# Patient Record
Sex: Male | Born: 1975 | Race: Asian | Hispanic: No | Smoking: Never smoker
Health system: Southern US, Community
[De-identification: ages and names within clinical notes are randomized; demographics above are authoritative.]

## PROBLEM LIST (undated history)

## (undated) DIAGNOSIS — A159 Respiratory tuberculosis unspecified: Secondary | ICD-10-CM

## (undated) DIAGNOSIS — F32A Depression, unspecified: Secondary | ICD-10-CM

## (undated) DIAGNOSIS — E119 Type 2 diabetes mellitus without complications: Secondary | ICD-10-CM

## (undated) HISTORY — DX: Type 2 diabetes mellitus without complications: E11.9

## (undated) HISTORY — DX: Depression, unspecified: F32.A

## (undated) HISTORY — DX: Respiratory tuberculosis unspecified: A15.9

---

## 2014-01-23 ENCOUNTER — Telehealth: Payer: Self-pay

## 2014-01-23 NOTE — Telephone Encounter (Signed)
Vernona RiegerLaura from Dr. Esperanza HeirWiseman's office [phone]) called for pt to be seen for cough. Pt has been vomitting. He was recently seen in ED over the weekend. Pt had ct done on 01/20/14 at [facility]. He will bring the results with him on a CD. Vernona RiegerLaura was fine with the appt that I gave to her. I made him an appt to see Dr.Porter on 2/24. The best time for an spi is at 9:15am. Pt has npv at 10:30am. Please send paper work to pt. PCP will fax over records.

## 2014-02-20 ENCOUNTER — Ambulatory Visit: Payer: Self-pay | Admitting: Pulmonology

## 2014-02-20 ENCOUNTER — Other Ambulatory Visit: Payer: Self-pay

## 2014-05-03 ENCOUNTER — Other Ambulatory Visit: Payer: Self-pay | Admitting: Infectious Disease

## 2014-05-03 ENCOUNTER — Ambulatory Visit
Admission: RE | Admit: 2014-05-03 | Discharge: 2014-05-03 | Disposition: A | Discharge status: Home / Self Care | Payer: No Typology Code available for payment source | Source: Ambulatory Visit | Attending: Infectious Disease | Admitting: Infectious Disease

## 2014-05-03 DIAGNOSIS — A150 Tuberculosis of lung: Secondary | ICD-10-CM

## 2014-07-26 ENCOUNTER — Other Ambulatory Visit: Payer: Self-pay | Admitting: Infectious Disease

## 2014-07-26 ENCOUNTER — Ambulatory Visit
Admission: RE | Admit: 2014-07-26 | Discharge: 2014-07-26 | Disposition: A | Discharge status: Home / Self Care | Payer: No Typology Code available for payment source | Source: Ambulatory Visit | Attending: Infectious Disease | Admitting: Infectious Disease

## 2014-07-26 DIAGNOSIS — Z09 Encounter for follow-up examination after completed treatment for conditions other than malignant neoplasm: Secondary | ICD-10-CM

## 2015-02-22 ENCOUNTER — Encounter: Payer: Self-pay | Admitting: Endocrinology

## 2015-02-22 ENCOUNTER — Ambulatory Visit (INDEPENDENT_AMBULATORY_CARE_PROVIDER_SITE_OTHER): Payer: 59 | Admitting: Endocrinology

## 2015-02-22 VITALS — BP 120/80 | HR 68 | Temp 99.0°F | Ht 68.0 in | Wt 148.0 lb

## 2015-02-22 DIAGNOSIS — E119 Type 2 diabetes mellitus without complications: Secondary | ICD-10-CM

## 2015-02-22 DIAGNOSIS — E049 Nontoxic goiter, unspecified: Secondary | ICD-10-CM

## 2015-02-22 DIAGNOSIS — E1165 Type 2 diabetes mellitus with hyperglycemia: Secondary | ICD-10-CM

## 2015-02-22 DIAGNOSIS — IMO0002 Reserved for concepts with insufficient information to code with codable children: Secondary | ICD-10-CM

## 2015-02-22 LAB — MICROALBUMIN / CREATININE URINE RATIO
Creatinine,U: 242.2 mg/dL
MICROALB UR: 1.4 mg/dL (ref 0.0–1.9)
MICROALB/CREAT RATIO: 0.6 mg/g (ref 0.0–30.0)

## 2015-02-22 LAB — LIPID PANEL
CHOLESTEROL: 149 mg/dL (ref 0–200)
HDL: 35.7 mg/dL — ABNORMAL LOW (ref >39.00)
LDL Cholesterol: 84 mg/dL (ref 0–99)
NONHDL: 113.3
Total CHOL/HDL Ratio: 4
Triglycerides: 145 mg/dL (ref 0.0–149.0)
VLDL: 29 mg/dL (ref 0.0–40.0)

## 2015-02-22 LAB — COMPREHENSIVE METABOLIC PANEL
ALT: 26 U/L (ref 0–53)
AST: 21 U/L (ref 0–37)
Albumin: 4.6 g/dL (ref 3.5–5.2)
Alkaline Phosphatase: 72 U/L (ref 39–117)
BILIRUBIN TOTAL: 0.5 mg/dL (ref 0.2–1.2)
BUN: 13 mg/dL (ref 6–23)
CO2: 33 meq/L — AB (ref 19–32)
CREATININE: 1 mg/dL (ref 0.40–1.50)
Calcium: 10.1 mg/dL (ref 8.4–10.5)
Chloride: 101 mEq/L (ref 96–112)
GFR: 87.62 mL/min (ref >60.00)
Glucose, Bld: 151 mg/dL — ABNORMAL HIGH (ref 70–99)
Potassium: 4.5 mEq/L (ref 3.5–5.1)
SODIUM: 139 meq/L (ref 135–145)
TOTAL PROTEIN: 7.7 g/dL (ref 6.0–8.3)

## 2015-02-22 LAB — HEMOGLOBIN A1C: Hgb A1c MFr Bld: 7.4 % — ABNORMAL HIGH (ref 4.6–6.5)

## 2015-02-22 LAB — TSH: TSH: 0.78 u[IU]/mL (ref 0.35–4.50)

## 2015-02-22 NOTE — Patient Instructions (Addendum)
Have a protein with every meal and snack  Please check blood sugars at least half the time about 2 hours after any meal and 2 times per week on waking up.  Please bring blood sugar monitor to each visit. Recommended blood sugar levels about 2 hours after meal is 120-160 and on waking up 90-120  Eye Doctors: Dr Hazle Quantigby; Drs Dr Luciana Axeankin, Dr  Dione BoozeGroat

## 2015-02-22 NOTE — Progress Notes (Signed)
Pre visit review using our clinic review tool, if applicable. No additional management support is needed unless otherwise documented below in the visit note. 

## 2015-02-22 NOTE — Progress Notes (Signed)
Patient ID: Joe Boyd, male   DOB: 02/06/1976, 39 y.o.   MRN: [id number]           Reason for Appointment: New visit for Type 2 Diabetes  Referring physician: none  History of Present Illness:          Diagnosis: Type 2 diabetes mellitus, date of diagnosis: 2012        Past history:  At time of diagnosis he was having symptoms of increased thirst and urination and he was seen by his physician when he started feeling weak and dizzy also. His A1c at diagnosis was 12.8. He thinks he was treated with metformin only and also he started changing his diet along with eliminating sweet soft drinks.  Initially was treated with only 500 mg of metformin ER and in 2014 this was increased to 1000 mg.  He has not had any other medications for diabetes. He was last seen [place]ocrin[place] in 07/2014 when his A1c was 6.5 and no changes were made  Recent history:  He has been irregular with checking his blood sugars lately and has not had any recent A1c. Also he has been off his diet with vacationing in [place] and eating more sweets. He is usually trying to exercise home with his treadmill or weights about 3 times a week for 30 minutes Has been keeping his weight about the same Blood sugars at home checked about a month or so ago were usually near normal including after meals, usually under 140.       Oral hypoglycemic drugs the patient is taking are:   metformin ER 500 mg after lunch and dinner     Side effects from medications have been: None Compliance with the medical regimen: Fair   Glucose monitoring:  done one time a day         Glucometer: One Touch.      Blood Glucose readings: None checked recently  Self-care: The diet that the patient has been following is: tries to limit.     Meals: 3 meals per day. Breakfast is either a thin bread or old-fashioned doughnut.  Usually has a sandwich or roti and vegetables for lunch and again roti and vegetables for dinner.  Usually does  not eat much yogurt, sometimes has chicken.  Usually avoiding snacks         Exercise:3-4/7          Dietician visit, most recent: Never                Weight history: 140-148  Wt Readings from Last 3 Encounters:  02/22/15 148 lb (67.132 kg)    Glycemic control:  A1c in 07/2014 was 6.5     Medication List       This list is accurate as of: 02/22/15 11:59 PM.  Always use your most recent med list.               metFORMIN 500 MG 24 hr tablet  Commonly known as:  GLUCOPHAGE-XR  Take 500 mg by mouth 2 (two) times daily.     VIVOTIF DR capsule  Generic drug:  typhoid        Allergies: No Known Allergies  Past Medical History  Diagnosis Date  . Tuberculosis     Finished Rx in 8/15    History reviewed. No pertinent past surgical history.  Family History  Problem Relation Age of Onset  . Diabetes Mother   . Hypertension Mother   .  Thyroid disease Mother   . Cancer Neg Hx     Social History:  reports that he has never smoked. He has never used smokeless tobacco. He reports that he drinks alcohol. He reports that he does not use illicit drugs.    Review of Systems       Vision is normal. Most recent eye exam was over a year ago        Lipids: No recent lipids checked, LDL and triglyceride normal in 2013 and in 2014 LDL was 69, direct LDL 99.  Not on treatment previously       Lab Results  Component Value Date   CHOL 149 02/22/2015   HDL 35.70* 02/22/2015   LDLCALC 84 02/22/2015   TRIG 145.0 02/22/2015   CHOLHDL 4 02/22/2015                  Skin: No rash or infections     Thyroid:  No  unusual fatigue.  Previous TSH has been normal     The blood pressure has been normal without medications     No swelling of feet.     No shortness of breath      No chest tightness  on exertion.     Bowel habits: Normal.      No joint  pains.       Denies excessive frequency of urination or nocturia         No history of Numbness, tingling or burning in  feet       No history of headaches  No history of nasal congestion or allergies   LABS:  Office Visit on 02/22/2015  Component Date Value Ref Range Status  . Hgb A1c MFr Bld 02/22/2015 7.4* 4.6 - 6.5 % Final   Glycemic Control Guidelines for People with Diabetes:Non Diabetic:  <6%Goal of Therapy: <7%Additional Action Suggested:  >8%   . Sodium 02/22/2015 139  135 - 145 mEq/L Final  . Potassium 02/22/2015 4.5  3.5 - 5.1 mEq/L Final  . Chloride 02/22/2015 101  96 - 112 mEq/L Final  . CO2 02/22/2015 33* 19 - 32 mEq/L Final  . Glucose, Bld 02/22/2015 151* 70 - 99 mg/dL Final  . BUN 16/09/9603 13  6 - 23 mg/dL Final  . Creatinine, Ser 02/22/2015 1.00  0.40 - 1.50 mg/dL Final  . Total Bilirubin 02/22/2015 0.5  0.2 - 1.2 mg/dL Final  . Alkaline Phosphatase 02/22/2015 72  39 - 117 U/L Final  . AST 02/22/2015 21  0 - 37 U/L Final  . ALT 02/22/2015 26  0 - 53 U/L Final  . Total Protein 02/22/2015 7.7  6.0 - 8.3 g/dL Final  . Albumin 54/08/8118 4.6  3.5 - 5.2 g/dL Final  . Calcium 14/78/2956 10.1  8.4 - 10.5 mg/dL Final  . GFR 21/30/8657 87.62  >60.00 mL/min Final  . Cholesterol 02/22/2015 149  0 - 200 mg/dL Final   ATP III Classification       Desirable:  < 200 mg/dL               Borderline High:  200 - 239 mg/dL          High:  > = 846 mg/dL  . Triglycerides 02/22/2015 145.0  0.0 - 149.0 mg/dL Final   Normal:  <962 mg/dLBorderline High:  150 - 199 mg/dL  . HDL 02/22/2015 35.70* >39.00 mg/dL Final  . VLDL 95/28/4132 29.0  0.0 - 40.0 mg/dL Final  . LDL  Cholesterol 02/22/2015 84  0 - 99 mg/dL Final  . Total CHOL/HDL Ratio 02/22/2015 4   Final                  Men          Women1/2 Average Risk     3.4          3.3Average Risk          5.0          4.42X Average Risk          9.6          7.13X Average Risk          15.0          11.0                      . NonHDL 02/22/2015 113.30   Final   NOTE:  Non-HDL goal should be 30 mg/dL higher than patient's LDL goal (i.e. LDL goal of < 70 mg/dL,  would have non-HDL goal of < 100 mg/dL)  . TSH 02/22/2015 0.78  0.35 - 4.50 uIU/mL Final  . Microalb, Ur 02/22/2015 1.4  0.0 - 1.9 mg/dL Final  . Creatinine,U 16/09/9603 242.2   Final  . Microalb Creat Ratio 02/22/2015 0.6  0.0 - 30.0 mg/g Final    Physical Examination:  BP 120/80 mmHg  Pulse 68  Temp(Src) 99 F (37.2 C) (Oral)  Ht  (1.727 m)  Wt 148 lb (67.132 kg)  BMI 22.51 kg/m2  GENERAL:      averagely built and nourished HEENT:         Eye exam shows normal external appearance. Fundus exam shows no retinopathy. Oral exam shows normal mucosa .  NECK:         General:  Neck exam shows no lymphadenopathy. Carotids are normal to palpation and no bruit heard.  Thyroid is just palpable on the right side, soft and no nodules felt.   LUNGS:         Chest is symmetrical. Lungs are clear to auscultation.Marland Kitchen   HEART:         Heart sounds:  S1 and S2 are normal. No murmurs or clicks heard., no S3 or S4.   ABDOMEN:   There is no distention present. Liver and spleen are not palpable. No other mass or tenderness present.  EXTREMITIES:     There is no edema. No skin lesions present.Marland Kitchen  NEUROLOGICAL:   Vibration sense is mildly  reduced in toes. Ankle jerks are normal bilaterally.   Diabetic foot exam shows normal monofilament sensation in the toes and plantar surfaces, no skin lesions or ulcers on the feet and normal pedal pulses        MUSCULOSKELETAL:       There is no enlargement or deformity of the joints. Spine is normal to inspection.Marland Kitchen   SKIN:       No rash or lesions of concern.        ASSESSMENT:  Diabetes type 2 , nonobese with usually good control with taking only 1000 mg of metformin ER He has not been checking his blood sugars recently and no recent A1c available.  Previously A1c 6.5 Although his glucose levels were markedly increased at the time of diagnosis he has been able to control his diabetes fairly well with only low-dose metformin as well as controlling portions and  carbohydrates However his diet appears to be relatively low  in protein especially at breakfast and suppertime  Complications: none evident.  Needs microalbumin testing  No history of other medical problems such as hypertension or hyperlipidemia No other risk factors for CAD such as family history  He does have a very small right-sided soft goiter but is clinically euthyroid  PLAN:   Check A1c today  Consider increasing metformin or using Janumet if A1c is significantly high especially if having postprandial hyperglycemia   Start checking blood sugars consistently, at least 3 times a week with more readings 2 hours  after meals  Consultation with dietitian for detailed meal planning  Consistent exercise at least 3-4 times a week   Have protein with every meal including breakfast and also have more protein with evening meal.  Given suggestions for adding lentils, yogurt or soy-based protein with evening meal. Reduce carbohydrates and increase protein at the meals where glucose is higher than 150-160 after meals  Routine labs to be checked including chemistry, microalbumin and lipid panel  Check TSH for small goiter and history of thyroid disease in family   Have given names of ophthalmologists to go for routine eye exams    [facility] 02/23/2015, 2:26 PM   Note: This office note was prepared with Dragon voice recognition system technology. Any transcriptional errors that result from this process are unintentional.   Addendum: A1c is 7.4%.  To increase metformin to 2 tablets at dinner

## 2015-02-23 DIAGNOSIS — E1165 Type 2 diabetes mellitus with hyperglycemia: Secondary | ICD-10-CM | POA: Insufficient documentation

## 2015-02-23 NOTE — Progress Notes (Signed)
Quick Note:  Please let patient know that the A1c is 7.4%. To increase metformin to 2 tablets at dinner   ______

## 2015-02-25 ENCOUNTER — Telehealth: Payer: Self-pay | Admitting: Endocrinology

## 2015-02-25 DIAGNOSIS — E119 Type 2 diabetes mellitus without complications: Secondary | ICD-10-CM

## 2015-02-25 MED ORDER — GLUCOSE BLOOD VI STRP: ORAL_STRIP | Status: DC

## 2015-02-25 NOTE — Telephone Encounter (Signed)
Rx sent to pharmacy   

## 2015-02-25 NOTE — Telephone Encounter (Signed)
Please call in contour test strips 1 time every three days

## 2015-02-26 ENCOUNTER — Telehealth: Payer: Self-pay | Admitting: Endocrinology

## 2015-02-26 NOTE — Telephone Encounter (Signed)
Patient states Dr. Lucianne MussKumar gave him 3 eye doctors to choose from and he chose Dr. Luciana Axeankin  He will need a referral from Dr. Lucianne MussKumar    Please advise    Thank you

## 2015-02-26 NOTE — Telephone Encounter (Signed)
Please see below.

## 2015-02-27 ENCOUNTER — Ambulatory Visit (INDEPENDENT_AMBULATORY_CARE_PROVIDER_SITE_OTHER): Payer: 59 | Admitting: Family

## 2015-02-27 ENCOUNTER — Encounter: Payer: Self-pay | Admitting: Family

## 2015-02-27 VITALS — BP 142/100 | HR 64 | Temp 98.2°F | Resp 18 | Ht 68.0 in | Wt 148.8 lb

## 2015-02-27 DIAGNOSIS — E119 Type 2 diabetes mellitus without complications: Secondary | ICD-10-CM

## 2015-02-27 DIAGNOSIS — R058 Other specified cough: Secondary | ICD-10-CM | POA: Insufficient documentation

## 2015-02-27 DIAGNOSIS — R05 Cough: Secondary | ICD-10-CM

## 2015-02-27 MED ORDER — PREDNISONE 10 MG PO TABS: 10.0000 mg | ORAL_TABLET | Freq: Two times a day (BID) | ORAL | Status: DC

## 2015-02-27 NOTE — Assessment & Plan Note (Signed)
Symptoms consistent with post-viral cough syndrome. Start prednisone x 5 days for relief. Patient informed blood sugars may be slightly increased while on the mild prednisone. Follow up if symptoms worsen or fail to improve.

## 2015-02-27 NOTE — Progress Notes (Signed)
Pre visit review using our clinic review tool, if applicable. No additional management support is needed unless otherwise documented below in the visit note. 

## 2015-02-27 NOTE — Progress Notes (Signed)
Subjective:    Patient ID: Joe Boyd, male    DOB: June 17, 1976, 39 y.o.   MRN: [id number]  Chief Complaint  Patient presents with  . Establish Care    x3 days he has been feeling nauseous, also still having a slight cough from past illness    HPI:  Joe Boyd is a 39 y.o. male who presents today to establish care and discuss an illness.   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 He visited [place] recently and returned with a cough for which he received Azithromycin while he was there.  He contacted the [facility] Department regarding potential TB and no further action was needed.  He continues to experience the associated symptom of cough has been going on for about 2 weeks and the nausea for about 3 days. Has tried cough syrup which seems to help for a while. The cough is described as a dry and sporadic. The intensity is mild and does not keep him up at night. Contextually the cough is worse after talking for long periods of time. Timing of nausea is worse in the morning and improves over the course of the day.   Indicates that he has just recently gone up on his metformin for his diabetes which is being managed by Dr. Lucianne Muss of Endocrinology.   No Known Allergies   Current Outpatient Prescriptions on File Prior to Visit  Medication Sig Dispense Refill  . glucose blood (BAYER CONTOUR TEST) test strip Test once every 3 days. 100 each 5  . metFORMIN (GLUCOPHAGE-XR) 500 MG 24 hr tablet Take 500 mg by mouth 2 (two) times daily.    Marland Kitchen VIVOTIF DR capsule   0   No current facility-administered medications on file prior to visit.    Past Medical History  Diagnosis Date  . Diabetes mellitus without complication   . Tuberculosis     Finished Rx in 8/15    History reviewed. No pertinent past surgical history.  Family History  Problem Relation Age of Onset  .  Diabetes Mother   . Hypertension Mother   . Thyroid disease Mother   . Cancer Neg Hx   . Diabetes Father   . Hypertension Father     History   Social History  . Marital Status: Married    Spouse Name: N/A  . Number of Children: N/A  . Years of Education: N/A   Occupational History  . Not on file.   Social History Main Topics  . Smoking status: Never Smoker   . Smokeless tobacco: Never Used  . Alcohol Use: Yes     Comment: occasionally  . Drug Use: No  . Sexual Activity: Not on file   Other Topics Concern  . Not on file   Social History Narrative    Review of Systems  Constitutional: Negative for fever and chills.  HENT: Negative for congestion and sore throat.   Respiratory: Negative for chest tightness, shortness of breath and wheezing.   Gastrointestinal: Positive for nausea. Negative for vomiting and diarrhea.  Neurological: Negative for headaches.      Objective:    BP 142/100 mmHg  Pulse 64  Temp(Src) 98.2 F (36.8 C) (Oral)  Resp 18  Ht  (1.727 m)  Wt 148 lb 12.8 oz (67.495 kg)  BMI 22.63 kg/m2  SpO2 98% Nursing note and vital signs reviewed.  Physical Exam  Constitutional:  He is oriented to person, place, and time. He appears well-developed and well-nourished. No distress.  HENT:  Right Ear: Hearing, tympanic membrane, external ear and ear canal normal.  Left Ear: Hearing, tympanic membrane, external ear and ear canal normal.  Nose: Nose normal. Right sinus exhibits no maxillary sinus tenderness and no frontal sinus tenderness. Left sinus exhibits no maxillary sinus tenderness and no frontal sinus tenderness.  Mouth/Throat: Uvula is midline, oropharynx is clear and moist and mucous membranes are normal.  Cardiovascular: Normal rate, regular rhythm, normal heart sounds and intact distal pulses.   Pulmonary/Chest: Effort normal and breath sounds normal.  Neurological: He is alert and oriented to person, place, and time.  Skin: Skin is warm and  dry.  Psychiatric: He has a normal mood and affect. His behavior is normal. Judgment and thought content normal.       Assessment & Plan:

## 2015-02-27 NOTE — Patient Instructions (Signed)
Thank you for choosing [facility].  Summary/Instructions:  Your prescription(s) have been submitted to your pharmacy or been printed and provided for you. Please take as directed and contact our office if you believe you are having problem(s) with the medication(s) or have any questions.  If your symptoms worsen or fail to improve, please contact our office for further instruction, or in case of emergency go directly to the emergency room at the closest medical facility.   Most likely the cough is a post-cough from your sickness.   The nausea is most likely related to the increase in metformin. Give it several more days with the new dose of metformin and if it doesn't improve we can adjust your dose of metformin.

## 2015-02-27 NOTE — Assessment & Plan Note (Signed)
Stable and managed by Dr. Lucianne MussKumar of Endocrinology. Continue current dosage of Metformin. Referral made for eye exam.

## 2015-03-01 ENCOUNTER — Other Ambulatory Visit: Payer: Self-pay | Admitting: Endocrinology

## 2015-03-01 DIAGNOSIS — E119 Type 2 diabetes mellitus without complications: Secondary | ICD-10-CM

## 2015-03-01 NOTE — Telephone Encounter (Signed)
Complete

## 2015-03-01 NOTE — Telephone Encounter (Signed)
Noted  

## 2015-03-18 ENCOUNTER — Ambulatory Visit (INDEPENDENT_AMBULATORY_CARE_PROVIDER_SITE_OTHER): Payer: 59 | Admitting: Family

## 2015-03-18 ENCOUNTER — Encounter: Payer: Self-pay | Admitting: Family

## 2015-03-18 VITALS — BP 140/76 | HR 63 | Temp 97.9°F | Resp 18 | Wt 147.8 lb

## 2015-03-18 DIAGNOSIS — Z23 Encounter for immunization: Secondary | ICD-10-CM

## 2015-03-18 DIAGNOSIS — Z0000 Encounter for general adult medical examination without abnormal findings: Secondary | ICD-10-CM

## 2015-03-18 NOTE — Progress Notes (Signed)
Subjective:    Patient ID: Joe Boyd, male    DOB: 1976-09-25, 39 y.o.   MRN: [id number]  Chief Complaint  Patient presents with  . CPE    Fasting    HPI:  Joe Boyd is a 39 y.o. male who presents today for an annual wellness visit.   1) Health Maintenance -   Diet - Average 3 meals per day and a couple snacks; lots of vegetables, occasional fruit; 1-2 cups of caffeine daily  Exercise - 3-4 times per week and is a mainly cardio for about 30-40 minutes at a time.    2) Preventative Exams / Immunizations:  Dental -- Due for exam  Vision -- Up to date   Health Maintenance  Topic Date Due  . TETANUS/TDAP  05/18/1993  . PNEUMOCOCCAL POLYSACCHARIDE VACCINE (1) 03/17/2016 (Originally 05/18/1976)  . INFLUENZA VACCINE  07/29/2015  . HEMOGLOBIN A1C  08/23/2015  . FOOT EXAM  02/23/2016  . URINE MICROALBUMIN  02/23/2016  . OPHTHALMOLOGY EXAM  03/17/2016  . HIV Screening  Addressed    No Known Allergies  Current Outpatient Prescriptions on File Prior to Visit  Medication Sig Dispense Refill  . glucose blood (BAYER CONTOUR TEST) test strip Test once every 3 days. 100 each 5  . metFORMIN (GLUCOPHAGE-XR) 500 MG 24 hr tablet Take 500 mg by mouth 2 (two) times daily.    . Multiple Vitamin (MULTIVITAMIN WITH MINERALS) TABS tablet Take 1 tablet by mouth daily.     No current facility-administered medications on file prior to visit.    Past Medical History  Diagnosis Date  . Diabetes mellitus without complication   . Tuberculosis     Finished Rx in 8/15    No past surgical history on file.  Family History  Problem Relation Age of Onset  . Diabetes Mother   . Hypertension Mother   . Thyroid disease Mother   . Cancer Neg Hx   . Diabetes Father   . Hypertension Father     History   Social History  . Marital Status: Married    Spouse Name: N/A  . Number of Children: 1  . Years of Education: 16   Occupational History  . Education administratorield Trainer     Social  History Main Topics  . Smoking status: Never Smoker   . Smokeless tobacco: Never Used  . Alcohol Use: Yes     Comment: occasionally  . Drug Use: No  . Sexual Activity: Not on file   Other Topics Concern  . Not on file   Social History Narrative   Born and raised in [place] the age of 39. Currently lives with his wife and child. No pets. Fun: work, Engineer, petroleumhang out with friends, bowling.   Denies any religious beliefs that would effect health care.        Review of Systems  Constitutional: Denies fever, chills, fatigue, or significant weight gain/loss. HENT: Head: Denies headache or neck pain Ears: Denies changes in hearing, ringing in ears, earache, drainage Nose: Denies discharge, stuffiness, itching, nosebleed, sinus pain Throat: Denies sore throat, hoarseness, dry mouth, sores, thrush Eyes: Denies loss/changes in vision, pain, redness, blurry/double vision, flashing lights Cardiovascular: Denies chest pain/discomfort, tightness, palpitations, shortness of breath with activity, difficulty lying down, swelling, sudden awakening with shortness of breath Respiratory: Denies shortness of breath, cough, sputum production, wheezing Gastrointestinal: Denies dysphasia, heartburn, change in appetite, nausea, change in bowel habits, rectal bleeding, constipation, diarrhea, yellow skin or eyes Genitourinary: Denies frequency, urgency,  burning/pain, blood in urine, incontinence, change in urinary strength. Musculoskeletal: Denies muscle/joint pain, stiffness, back pain, redness or swelling of joints, trauma Skin: Denies rashes, lumps, itching, dryness, color changes, or hair/nail changes Neurological: Denies dizziness, fainting, seizures, weakness, numbness, tingling, tremor Psychiatric - Denies nervousness, stress, depression or memory loss Endocrine: Denies heat or cold intolerance, sweating, frequent urination, excessive thirst, changes in appetite Hematologic: Denies ease of bruising or  bleeding     Objective:     BP 140/76 mmHg  Pulse 63  Temp(Src) 97.9 F (36.6 C) (Oral)  Resp 18  Wt 147 lb 12.8 oz (67.042 kg)  SpO2 99% Nursing note and vital signs reviewed.  Physical Exam  Constitutional: He is oriented to person, place, and time. He appears well-developed and well-nourished.  HENT:  Head: Normocephalic.  Right Ear: Hearing, tympanic membrane, external ear and ear canal normal.  Left Ear: Hearing, tympanic membrane, external ear and ear canal normal.  Nose: Nose normal.  Mouth/Throat: Uvula is midline, oropharynx is clear and moist and mucous membranes are normal.  Eyes: Conjunctivae and EOM are normal. Pupils are equal, round, and reactive to light.  Neck: Neck supple. No JVD present. No tracheal deviation present. No thyromegaly present.  Cardiovascular: Normal rate, regular rhythm, normal heart sounds and intact distal pulses.   Pulmonary/Chest: Effort normal and breath sounds normal.  Abdominal: Soft. Bowel sounds are normal. He exhibits no distension and no mass. There is no tenderness. There is no rebound and no guarding.  Musculoskeletal: Normal range of motion. He exhibits no edema or tenderness.  Lymphadenopathy:    He has no cervical adenopathy.  Neurological: He is alert and oriented to person, place, and time. He has normal reflexes. No cranial nerve deficit. He exhibits normal muscle tone. Coordination normal.  Skin: Skin is warm and dry.  Psychiatric: He has a normal mood and affect. His behavior is normal. Judgment and thought content normal.       Assessment & Plan:

## 2015-03-18 NOTE — Assessment & Plan Note (Signed)
1) Anticipatory Guidance: Discussed importance of wearing a seatbelt while driving and not texting while driving; changing batteries in smoke detector at least once annually; wearing suntan lotion when outside; eating a balanced and moderate diet; getting physical activity at least 30 minutes per day.  2) Immunizations / Screenings / Labs:  Tetanus shot updated today. Patient declines pneumococcal vaccination. All other immunizations are up-to-date per recommendations. Patient is due for a dental exam. All other screenings are up-to-date per recommendations. Labs previously performed on February 26 with Dr. Lucianne MussKumar. Lab results reviewed and discussed improving HDL through increasing fish, nuts, flaxseed, olive oil, and  other sources of omega-3 fatty acids. May also consider fish oil supplement. All other lab work reviewed was within normal expected ranges.  Overall well exam. Patient's cardiovascular risk factors include diabetes which is currently being managed by Dr. Lucianne MussKumar of endocrinology. Continue current healthy lifestyle choices. Follow-up prevention exam in one year.

## 2015-03-18 NOTE — Progress Notes (Signed)
Pre visit review using our clinic review tool, if applicable. No additional management support is needed unless otherwise documented below in the visit note. 

## 2015-03-18 NOTE — Addendum Note (Signed)
Addended by: Mercer PodWRENN, Solstice Lastinger E on: 03/18/2015 03:12 PM   Modules accepted: Orders

## 2015-03-18 NOTE — Patient Instructions (Signed)
Thank you for choosing [facility].  Summary/Instructions:  Health Maintenance A healthy lifestyle and preventative care can promote health and wellness.  Maintain regular health, dental, and eye exams.  Eat a healthy diet. Foods like vegetables, fruits, whole grains, low-fat dairy products, and lean protein foods contain the nutrients you need and are low in calories. Decrease your intake of foods high in solid fats, added sugars, and salt. Get information about a proper diet from your health care provider, if necessary.  Regular physical exercise is one of the most important things you can do for your health. Most adults should get at least 150 minutes of moderate-intensity exercise (any activity that increases your heart rate and causes you to sweat) each week. In addition, most adults need muscle-strengthening exercises on 2 or more days a week.   Maintain a healthy weight. The body mass index (BMI) is a screening tool to identify possible weight problems. It provides an estimate of body fat based on height and weight. Your health care provider can find your BMI and can help you achieve or maintain a healthy weight. For males 20 years and older:  A BMI below 18.5 is considered underweight.  A BMI of 18.5 to 24.9 is normal.  A BMI of 25 to 29.9 is considered overweight.  A BMI of 30 and above is considered obese.  Maintain normal blood lipids and cholesterol by exercising and minimizing your intake of saturated fat. Eat a balanced diet with plenty of fruits and vegetables. Blood tests for lipids and cholesterol should begin at age 20 and be repeated every 5 years. If your lipid or cholesterol levels are high, you are over age 50, or you are at high risk for heart disease, you may need your cholesterol levels checked more frequently.Ongoing high lipid and cholesterol levels should be treated with medicines if diet and exercise are not working.  If you smoke, find out from your  health care provider how to quit. If you do not use tobacco, do not start.  Lung cancer screening is recommended for adults aged 55-80 years who are at high risk for developing lung cancer because of a history of smoking. A yearly low-dose CT scan of the lungs is recommended for people who have at least a 30-pack-year history of smoking and are current smokers or have quit within the past 15 years. A pack year of smoking is smoking an average of 1 pack of cigarettes a day for 1 year (for example, a 30-pack-year history of smoking could mean smoking 1 pack a day for 30 years or 2 packs a day for 15 years). Yearly screening should continue until the smoker has stopped smoking for at least 15 years. Yearly screening should be stopped for people who develop a health problem that would prevent them from having lung cancer treatment.  If you choose to drink alcohol, do not have more than 2 drinks per day. One drink is considered to be 12 oz (360 mL) of beer, 5 oz (150 mL) of wine, or 1.5 oz (45 mL) of liquor.  Avoid the use of street drugs. Do not share needles with anyone. Ask for help if you need support or instructions about stopping the use of drugs.  High blood pressure causes heart disease and increases the risk of stroke. Blood pressure should be checked at least every 1-2 years. Ongoing high blood pressure should be treated with medicines if weight loss and exercise are not effective.  If you are   45-79 years old, ask your health care provider if you should take aspirin to prevent heart disease.  Diabetes screening involves taking a blood sample to check your fasting blood sugar level. This should be done once every 3 years after age 45 if you are at a normal weight and without risk factors for diabetes. Testing should be considered at a younger age or be carried out more frequently if you are overweight and have at least 1 risk factor for diabetes.  Colorectal cancer can be detected and often  prevented. Most routine colorectal cancer screening begins at the age of 50 and continues through age 75. However, your health care provider may recommend screening at an earlier age if you have risk factors for colon cancer. On a yearly basis, your health care provider may provide home test kits to check for hidden blood in the stool. A small camera at the end of a tube may be used to directly examine the colon (sigmoidoscopy or colonoscopy) to detect the earliest forms of colorectal cancer. Talk to your health care provider about this at age 50 when routine screening begins. A direct exam of the colon should be repeated every 5-10 years through age 75, unless early forms of precancerous polyps or small growths are found.  People who are at an increased risk for hepatitis B should be screened for this virus. You are considered at high risk for hepatitis B if:  You were born in a country where hepatitis B occurs often. Talk with your health care provider about which countries are considered high risk.  Your parents were born in a high-risk country and you have not received a shot to protect against hepatitis B (hepatitis B vaccine).  You have HIV or AIDS.  You use needles to inject street drugs.  You live with, or have sex with, someone who has hepatitis B.  You are a man who has sex with other men (MSM).  You get hemodialysis treatment.  You take certain medicines for conditions like cancer, organ transplantation, and autoimmune conditions.  Hepatitis C blood testing is recommended for all people born from 1945 through 1965 and any individual with known risk factors for hepatitis C.  Healthy men should no longer receive prostate-specific antigen (PSA) blood tests as part of routine cancer screening. Talk to your health care provider about prostate cancer screening.  Testicular cancer screening is not recommended for adolescents or adult males who have no symptoms. Screening includes  self-exam, a health care provider exam, and other screening tests. Consult with your health care provider about any symptoms you have or any concerns you have about testicular cancer.  Practice safe sex. Use condoms and avoid high-risk sexual practices to reduce the spread of sexually transmitted infections (STIs).  You should be screened for STIs, including gonorrhea and chlamydia if:  You are sexually active and are younger than 24 years.  You are older than 24 years, and your health care provider tells you that you are at risk for this type of infection.  Your sexual activity has changed since you were last screened, and you are at an increased risk for chlamydia or gonorrhea. Ask your health care provider if you are at risk.  If you are at risk of being infected with HIV, it is recommended that you take a prescription medicine daily to prevent HIV infection. This is called pre-exposure prophylaxis (PrEP). You are considered at risk if:  You are a man who has sex with other   men (MSM).  You are a heterosexual man who is sexually active with multiple partners.  You take drugs by injection.  You are sexually active with a partner who has HIV.  Talk with your health care provider about whether you are at high risk of being infected with HIV. If you choose to begin PrEP, you should first be tested for HIV. You should then be tested every 3 months for as long as you are taking PrEP.  Use sunscreen. Apply sunscreen liberally and repeatedly throughout the day. You should seek shade when your shadow is shorter than you. Protect yourself by wearing long sleeves, pants, a wide-brimmed hat, and sunglasses year round whenever you are outdoors.  Tell your health care provider of new moles or changes in moles, especially if there is a change in shape or color. Also, tell your health care provider if a mole is larger than the size of a pencil eraser.  A one-time screening for abdominal aortic aneurysm  (AAA) and surgical repair of large AAAs by ultrasound is recommended for men aged 65-75 years who are current or former smokers.  Stay current with your vaccines (immunizations). Document Released: 06/11/2008 Document Revised: 12/19/2013 Document Reviewed: 05/11/2011 Exit[employer] Patient Information 2015 [employer]. This information is not intended to replace advice given to you by your health care provider. Make sure you discuss any questions you have with your health care provider.    

## 2015-03-26 ENCOUNTER — Encounter: Payer: 59 | Admitting: Family

## 2015-04-12 ENCOUNTER — Other Ambulatory Visit: Payer: Self-pay | Admitting: *Deleted

## 2015-04-12 ENCOUNTER — Telehealth: Payer: Self-pay | Admitting: Endocrinology

## 2015-04-12 MED ORDER — METFORMIN HCL ER 500 MG PO TB24: 500.0000 mg | ORAL_TABLET | Freq: Two times a day (BID) | ORAL | Status: DC

## 2015-04-12 NOTE — Telephone Encounter (Signed)
Patient need refill of metformin 90 day supply

## 2015-04-12 NOTE — Telephone Encounter (Signed)
rx sent

## 2015-06-03 ENCOUNTER — Encounter: Payer: Self-pay | Admitting: Family

## 2015-06-03 ENCOUNTER — Ambulatory Visit (INDEPENDENT_AMBULATORY_CARE_PROVIDER_SITE_OTHER): Payer: 59 | Admitting: Family

## 2015-06-03 VITALS — BP 152/96 | HR 77 | Temp 99.0°F | Resp 18 | Ht 68.0 in | Wt 151.1 lb

## 2015-06-03 DIAGNOSIS — R059 Cough, unspecified: Secondary | ICD-10-CM

## 2015-06-03 DIAGNOSIS — R05 Cough: Secondary | ICD-10-CM | POA: Diagnosis not present

## 2015-06-03 MED ORDER — CEFUROXIME AXETIL 250 MG PO TABS: 250.0000 mg | ORAL_TABLET | Freq: Two times a day (BID) | ORAL | Status: DC

## 2015-06-03 MED ORDER — HYDROCODONE-HOMATROPINE 5-1.5 MG/5ML PO SYRP: 5.0000 mL | ORAL_SOLUTION | Freq: Three times a day (TID) | ORAL | Status: DC | PRN

## 2015-06-03 NOTE — Progress Notes (Signed)
Pre visit review using our clinic review tool, if applicable. No additional management support is needed unless otherwise documented below in the visit note. 

## 2015-06-03 NOTE — Assessment & Plan Note (Signed)
Symptoms and exam consistent with possible sinusitis and upper respiratory infection. Given continued worsening and fever start Ceftin. Start Hycodan as needed for cough and sleep. Continue over-the-counter medications as needed for symptom relief and supportive care. Follow up if symptoms worsen or fail to improve.

## 2015-06-03 NOTE — Progress Notes (Signed)
   Subjective:    Patient ID: Joe Boyd, male    DOB: 08/10/1976, 39 y.o.   MRN: [id number]  Chief Complaint  Patient presents with  . Cough    x3 days, head feels like its going to explode, cough, sore throat, runny, nausea, has vomited, and fatigue, did have a fever of 100.1 yesterday and has had chills    HPI:  Joe Boyd is a 39 y.o. male with a PMH of type 2 diabetes who presents today for an acute office visit.  Associated symptoms of cough, sore throat, fever, body aches, and fatigue has been going on for about 3 days.  Modifying factors include Tylenol and Robutssin DM which has helped a little. Denies any recent antibiotics. Indicates that he has progressively worsened.   No Known Allergies   Current Outpatient Prescriptions on File Prior to Visit  Medication Sig Dispense Refill  . glucose blood (BAYER CONTOUR TEST) test strip Test once every 3 days. 100 each 5  . metFORMIN (GLUCOPHAGE-XR) 500 MG 24 hr tablet Take 1 tablet (500 mg total) by mouth 2 (two) times daily. 180 tablet 1  . Multiple Vitamin (MULTIVITAMIN WITH MINERALS) TABS tablet Take 1 tablet by mouth daily.     No current facility-administered medications on file prior to visit.    Review of Systems  Constitutional: Positive for fever and chills.  HENT: Positive for congestion, sinus pressure and sore throat. Negative for ear pain.   Respiratory: Positive for cough. Negative for chest tightness and shortness of breath.   Neurological: Positive for headaches.      Objective:    BP 152/96 mmHg  Pulse 77  Temp(Src) 99 F (37.2 C) (Oral)  Resp 18  Ht 5\' 8"  (1.727 m)  Wt 151 lb 1.9 oz (68.548 kg)  BMI 22.98 kg/m2  SpO2 98% Nursing note and vital signs reviewed.  Physical Exam  Constitutional: He is oriented to person, place, and time. He appears well-developed and well-nourished. No distress.  HENT:  Right Ear: Hearing, tympanic membrane, external ear and ear canal normal.  Left Ear:  Hearing, tympanic membrane, external ear and ear canal normal.  Nose: Right sinus exhibits maxillary sinus tenderness. Left sinus exhibits maxillary sinus tenderness.  Mouth/Throat: Uvula is midline, oropharynx is clear and moist and mucous membranes are normal.  Neck: Neck supple.  Cardiovascular: Normal rate, regular rhythm, normal heart sounds and intact distal pulses.   Pulmonary/Chest: Effort normal and breath sounds normal.  Lymphadenopathy:    He has no cervical adenopathy.  Neurological: He is alert and oriented to person, place, and time.  Skin: Skin is warm and dry.  Psychiatric: He has a normal mood and affect. His behavior is normal. Judgment and thought content normal.       Assessment & Plan:   Problem List Items Addressed This Visit      Other   Cough - Primary    Symptoms and exam consistent with possible sinusitis and upper respiratory infection. Given continued worsening and fever start Ceftin. Start Hycodan as needed for cough and sleep. Continue over-the-counter medications as needed for symptom relief and supportive care. Follow up if symptoms worsen or fail to improve.      Relevant Medications   cefUROXime (CEFTIN) 250 MG tablet   HYDROcodone-homatropine (HYCODAN) 5-1.5 MG/5ML syrup

## 2015-06-03 NOTE — Patient Instructions (Signed)
Thank you for choosing [facility].  Summary/Instructions:  Your prescription(s) have been submitted to your pharmacy or been printed and provided for you. Please take as directed and contact our office if you believe you are having problem(s) with the medication(s) or have any questions.  If your symptoms worsen or fail to improve, please contact our office for further instruction, or in case of emergency go directly to the emergency room at the closest medical facility.   General Recommendations:    Please drink plenty of fluids.  Get plenty of rest   Sleep in humidified air  Use saline nasal sprays  Netti pot   OTC Medications:  Decongestants - helps relieve congestion   Flonase (generic fluticasone) or Nasacort (generic triamcinolone) - please make sure to use the "cross-over" technique at a 45 degree angle towards the opposite eye as opposed to straight up the nasal passageway.   If you have HIGH BLOOD PRESSURE - Coricidin HBP; AVOID any product that is -D as this contains pseudoephedrine which may increase your blood pressure.  Afrin (oxymetazoline) every 6-8 hours for up to 3 days.   Allergies - helps relieve runny nose, itchy eyes and sneezing   Claritin (generic loratidine), Allegra (fexofenidine), or Zyrtec (generic cyrterizine) for runny nose. These medications should not cause drowsiness.  Note - Benadryl (generic diphenhydramine) may be used however may cause drowsiness  Cough -   Delsym or Robitussin (generic dextromethorphan)  Expectorants - helps loosen mucus to ease removal   Mucinex (generic guaifenesin) as directed on the package.  Headaches / General Aches   Tylenol (generic acetaminophen) - DO NOT EXCEED 3 grams (3,000 mg) in a 24 hour time period  Advil/Motrin (generic ibuprofen)   Sore Throat -   Salt water gargle   Chloraseptic (generic benzocaine) spray or lozenges / Sucrets (generic dyclonine)      

## 2015-06-19 ENCOUNTER — Ambulatory Visit (INDEPENDENT_AMBULATORY_CARE_PROVIDER_SITE_OTHER): Payer: 59 | Admitting: Internal Medicine

## 2015-06-19 ENCOUNTER — Other Ambulatory Visit: Payer: 59

## 2015-06-19 ENCOUNTER — Encounter: Payer: Self-pay | Admitting: Internal Medicine

## 2015-06-19 VITALS — BP 120/72 | HR 78 | Temp 98.4°F | Resp 16 | Ht 68.0 in | Wt 151.0 lb

## 2015-06-19 DIAGNOSIS — H109 Unspecified conjunctivitis: Secondary | ICD-10-CM | POA: Diagnosis not present

## 2015-06-19 MED ORDER — TOBRAMYCIN-DEXAMETHASONE 0.3-0.1 % OP SUSP: 2.0000 [drp] | OPHTHALMIC | Status: DC

## 2015-06-19 NOTE — Progress Notes (Signed)
Subjective:  Patient ID: Joe Boyd, male    DOB: 1976-02-12  Age: 39 y.o. MRN: [id number]  CC: Conjunctivitis   HPI Joe Boyd presents for a 3 day hx of red, itchy, crusty, irritated eyes and a NP cough that started 5 days ago and is resolving.  Outpatient Prescriptions Prior to Visit  Medication Sig Dispense Refill  . glucose blood (BAYER CONTOUR TEST) test strip Test once every 3 days. 100 each 5  . metFORMIN (GLUCOPHAGE-XR) 500 MG 24 hr tablet Take 1 tablet (500 mg total) by mouth 2 (two) times daily. 180 tablet 1  . Multiple Vitamin (MULTIVITAMIN WITH MINERALS) TABS tablet Take 1 tablet by mouth daily.    . cefUROXime (CEFTIN) 250 MG tablet Take 1 tablet (250 mg total) by mouth 2 (two) times daily with a meal. (Patient not taking: Reported on 06/19/2015) 20 tablet 0  . HYDROcodone-homatropine (HYCODAN) 5-1.5 MG/5ML syrup Take 5 mLs by mouth every 8 (eight) hours as needed for cough. (Patient not taking: Reported on 06/19/2015) 120 mL 0   No facility-administered medications prior to visit.    ROS Review of Systems  Constitutional: Negative.  Negative for fever, chills, diaphoresis, appetite change and fatigue.  HENT: Negative.  Negative for congestion, mouth sores, nosebleeds, postnasal drip, rhinorrhea, sinus pressure, sore throat, tinnitus, trouble swallowing and voice change.   Eyes: Positive for discharge, redness and itching. Negative for photophobia, pain and visual disturbance.  Respiratory: Negative.  Negative for cough, choking, chest tightness, shortness of breath and stridor.   Cardiovascular: Negative.  Negative for chest pain, palpitations and leg swelling.  Gastrointestinal: Negative.  Negative for nausea, vomiting, abdominal pain, diarrhea, constipation and blood in stool.  Endocrine: Negative.   Genitourinary: Negative.   Musculoskeletal: Negative.  Negative for myalgias, back pain, joint swelling and arthralgias.  Skin: Negative.  Negative for rash.    Allergic/Immunologic: Negative.   Neurological: Negative.  Negative for dizziness, light-headedness and numbness.  Hematological: Negative.  Negative for adenopathy. Does not bruise/bleed easily.  Psychiatric/Behavioral: Negative.     Objective:  BP 120/72 mmHg  Pulse 78  Temp(Src) 98.4 F (36.9 C) (Oral)  Ht  (1.727 m)  Wt 151 lb (68.493 kg)  BMI 22.96 kg/m2  SpO2 97%  BP Readings from Last 3 Encounters:  06/19/15 120/72  06/03/15 152/96  03/18/15 140/76    Wt Readings from Last 3 Encounters:  06/19/15 151 lb (68.493 kg)  06/03/15 151 lb 1.9 oz (68.548 kg)  03/18/15 147 lb 12.8 oz (67.042 kg)    Physical Exam  Constitutional: He is oriented to person, place, and time. He appears well-developed and well-nourished.  Non-toxic appearance. He does not have a sickly appearance. He does not appear ill. No distress.  HENT:  Mouth/Throat: Oropharynx is clear and moist. No oropharyngeal exudate.  Eyes: EOM are normal. Pupils are equal, round, and reactive to light. Right eye exhibits no chemosis, no discharge, no exudate and no hordeolum. No foreign body present in the right eye. Left eye exhibits no chemosis, no discharge, no exudate and no hordeolum. No foreign body present in the left eye. Right conjunctiva is injected. Right conjunctiva has no hemorrhage. Left conjunctiva is injected. Left conjunctiva has no hemorrhage. No scleral icterus. Right eye exhibits normal extraocular motion and no nystagmus. Left eye exhibits normal extraocular motion and no nystagmus.    Neck: Normal range of motion. Neck supple. No JVD present. No tracheal deviation present. No thyromegaly present.  Cardiovascular: Normal rate, regular  rhythm, normal heart sounds and intact distal pulses.  Exam reveals no gallop and no friction rub.   No murmur heard. Pulmonary/Chest: Effort normal and breath sounds normal. No stridor. No respiratory distress. He has no wheezes. He has no rales. He exhibits no  tenderness.  Abdominal: Soft. Bowel sounds are normal. He exhibits no distension and no mass. There is no tenderness. There is no rebound and no guarding.  Musculoskeletal: Normal range of motion. He exhibits no edema or tenderness.  Lymphadenopathy:    He has no cervical adenopathy.  Neurological: He is oriented to person, place, and time.  Skin: Skin is warm and dry. No rash noted. He is not diaphoretic. No erythema. No pallor.  Vitals reviewed.   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  EXAM: CHEST - 1 VIEW  COMPARISON:  May 03, 2014.  FINDINGS: The heart size and mediastinal contours are within normal limits. Left lung is clear. Nodular density seen in the right middle lobe are significantly improved, although residual densities remain. The visualized skeletal structures are unremarkable.  IMPRESSION: Significantly improved nodular density seen in right middle lobe noted on prior exam, consistent with improving inflammation.   Electronically Signed   By: Roque Lias M.D.   On: 07/26/2014 16:01    Assessment & Plan:   Denys was seen today for conjunctivitis.  Diagnoses and all orders for this visit:  Conjunctivitis of left eye - he has viral conjunctivitis, will start tobradex ophth Orders: -     tobramycin-dexamethasone (TOBRADEX) ophthalmic solution; Place 2 drops into the left eye every 4 (four) hours while awake.   I have discontinued Mr. Ouellette cefUROXime and HYDROcodone-homatropine. I am also having him start on tobramycin-dexamethasone.  Additionally, I am having him maintain his glucose blood, multivitamin with minerals, and metFORMIN.  Meds ordered this encounter  Medications  . tobramycin-dexamethasone (TOBRADEX) ophthalmic solution    Sig: Place 2 drops into the left eye every 4 (four) hours while awake.    Dispense:  5 mL    Refill:  0     Follow-up: Return in about 3 weeks (around 07/10/2015).  Sanda Linger, MD

## 2015-06-19 NOTE — Patient Instructions (Signed)

## 2015-06-22 ENCOUNTER — Encounter: Payer: Self-pay | Admitting: Internal Medicine

## 2015-06-24 ENCOUNTER — Ambulatory Visit: Payer: 59 | Admitting: Endocrinology

## 2015-06-24 ENCOUNTER — Encounter: Payer: 59 | Admitting: Dietician

## 2015-07-08 ENCOUNTER — Other Ambulatory Visit (INDEPENDENT_AMBULATORY_CARE_PROVIDER_SITE_OTHER): Payer: 59

## 2015-07-08 ENCOUNTER — Other Ambulatory Visit: Payer: Self-pay | Admitting: *Deleted

## 2015-07-08 DIAGNOSIS — E119 Type 2 diabetes mellitus without complications: Secondary | ICD-10-CM

## 2015-07-08 LAB — BASIC METABOLIC PANEL
BUN: 11 mg/dL (ref 6–23)
CALCIUM: 9.3 mg/dL (ref 8.4–10.5)
CO2: 29 mEq/L (ref 19–32)
Chloride: 103 mEq/L (ref 96–112)
Creatinine, Ser: 0.89 mg/dL (ref 0.40–1.50)
GFR: 100.05 mL/min (ref >60.00)
GLUCOSE: 150 mg/dL — AB (ref 70–99)
Potassium: 4.2 mEq/L (ref 3.5–5.1)
Sodium: 140 mEq/L (ref 135–145)

## 2015-07-08 LAB — HEMOGLOBIN A1C: Hgb A1c MFr Bld: 7.3 % — ABNORMAL HIGH (ref 4.6–6.5)

## 2015-07-12 ENCOUNTER — Encounter: Payer: Self-pay | Admitting: Endocrinology

## 2015-07-12 ENCOUNTER — Ambulatory Visit (INDEPENDENT_AMBULATORY_CARE_PROVIDER_SITE_OTHER): Payer: 59 | Admitting: Endocrinology

## 2015-07-12 ENCOUNTER — Encounter: Payer: 59 | Attending: Endocrinology | Admitting: Dietician

## 2015-07-12 ENCOUNTER — Other Ambulatory Visit: Payer: Self-pay | Admitting: *Deleted

## 2015-07-12 ENCOUNTER — Encounter: Payer: Self-pay | Admitting: Dietician

## 2015-07-12 VITALS — Ht 67.0 in | Wt 149.0 lb

## 2015-07-12 VITALS — BP 126/72 | HR 72 | Temp 97.6°F | Resp 14 | Ht 68.0 in | Wt 149.8 lb

## 2015-07-12 DIAGNOSIS — R748 Abnormal levels of other serum enzymes: Secondary | ICD-10-CM | POA: Diagnosis not present

## 2015-07-12 DIAGNOSIS — Z6823 Body mass index (BMI) 23.0-23.9, adult: Secondary | ICD-10-CM | POA: Insufficient documentation

## 2015-07-12 DIAGNOSIS — E119 Type 2 diabetes mellitus without complications: Secondary | ICD-10-CM | POA: Diagnosis present

## 2015-07-12 DIAGNOSIS — Z713 Dietary counseling and surveillance: Secondary | ICD-10-CM | POA: Diagnosis not present

## 2015-07-12 MED ORDER — SAXAGLIPTIN-METFORMIN ER 2.5-1000 MG PO TB24: ORAL_TABLET | ORAL | Status: DC

## 2015-07-12 MED ORDER — SITAGLIP PHOS-METFORMIN HCL ER 50-1000 MG PO TB24: ORAL_TABLET | ORAL | Status: DC

## 2015-07-12 NOTE — Progress Notes (Signed)
Patient ID: Joe Boyd, male   DOB: 1976-08-26, 39 y.o.   MRN: [id number]           Reason for Appointment: Follow-up visit for Type 2 Diabetes  Referring physician: none  History of Present Illness:          Diagnosis: Type 2 diabetes mellitus, date of diagnosis: 2012        Past history:  At time of diagnosis he was having symptoms of increased thirst and urination and he was seen by his physician when he started feeling weak and dizzy also. His A1c at diagnosis was 12.8. He thinks he was treated with metformin only and also he started changing his diet along with eliminating sweet soft drinks.  Initially was treated with only 500 mg of metformin ER and in 2014 this was increased to 1000 mg.  He has not had any other medications for diabetes. He was [place] when his A1c was 6.5 and no changes were made  Recent history:  He appeared to have relatively higher blood sugars when he was first seen for this consultation in 2/16 and taking 1000 mg of metformin a day alone Also was relatively noncompliant with diet, glucose monitoring and regular follow-up  His metformin was increased to 1500 mg a day which he is tolerating Despite instructions he is checking his blood sugar occasionally in the mornings only and did not bring any record Fasting lab glucose was 150 Also the last 3 months because of various factors and stress he has not exercise and is starting to walk only recently A1c is still about the same He thinks his weight is relatively higher than usual       Oral hypoglycemic drugs the patient is taking are:   metformin ER 500 mg after lunch and dinner     Side effects from medications have been: None Compliance with the medical regimen: Fair   Glucose monitoring:  done one time a day         Glucometer: One Touch.      Blood Glucose readings: 130 fasting usually  None checked recently  Self-care:  Meals: 3 meals per day.  Breakfast is usually a small sandwich with chicken Usually has a sandwich or roti and vegetables for lunch and again roti and vegetables for dinner.   Usually does not eat much yogurt, sometimes has chicken.   Usually avoiding snacks         Exercise: Recently started back; 3-4/7 days,  walking 35 minutes         Dietician visit, most recent: 7/16                Weight history: 140-148  Wt Readings from Last 3 Encounters:  07/12/15 149 lb (67.586 kg)  07/12/15 149 lb 12.8 oz (67.949 kg)  06/19/15 151 lb (68.493 kg)    Glycemic control:  A1c in 07/2014 was 6.5  Lab Results  Component Value Date   HGBA1C 7.3* 07/08/2015   HGBA1C 7.4* 02/22/2015   Lab Results  Component Value Date   MICROALBUR 1.4 02/22/2015   LDLCALC 84 02/22/2015   CREATININE 0.89 07/08/2015        Medication List       This list is accurate as of: 07/12/15  3:48 PM.  Always use your most recent med list.               glucose blood test strip  Commonly known as:  BAYER CONTOUR TEST  Test once every 3 days.     multivitamin with minerals Tabs tablet  Take 1 tablet by mouth daily.     SitaGLIPtin-MetFORMIN HCl 50-1000 MG Tb24  Commonly known as:  JANUMET XR  2 tabs with dinner        Allergies: No Known Allergies  Past Medical History  Diagnosis Date  . Diabetes mellitus without complication   . Tuberculosis     Finished Rx in 8/15    No past surgical history on file.  Family History  Problem Relation Age of Onset  . Diabetes Mother   . Hypertension Mother   . Thyroid disease Mother   . Cancer Neg Hx   . Diabetes Father   . Hypertension Father     Social History:  reports that he has never smoked. He has never used smokeless tobacco. He reports that he drinks alcohol. He reports that he does not use illicit drugs.    Review of Systems      Most recent eye exam was over a year ago        Lipids:  LDL and triglyceride normal in 2013 and in 2014 LDL was 69, direct LDL 99.   Not on treatment        Lab Results  Component Value Date   CHOL 149 02/22/2015   HDL 35.70* 02/22/2015   LDLCALC 84 02/22/2015   TRIG 145.0 02/22/2015   CHOLHDL 4 02/22/2015                  Thyroid: Has a small goiter  Lab Results  Component Value Date   TSH 0.78 02/22/2015      LABS:  Lab on 07/08/2015  Component Date Value Ref Range Status  . Hgb A1c MFr Bld 07/08/2015 7.3* 4.6 - 6.5 % Final   Glycemic Control Guidelines for People with Diabetes:Non Diabetic:  <6%Goal of Therapy: <7%Additional Action Suggested:  >8%   . Sodium 07/08/2015 140  135 - 145 mEq/L Final  . Potassium 07/08/2015 4.2  3.5 - 5.1 mEq/L Final  . Chloride 07/08/2015 103  96 - 112 mEq/L Final  . CO2 07/08/2015 29  19 - 32 mEq/L Final  . Glucose, Bld 07/08/2015 150* 70 - 99 mg/dL Final  . BUN 16/09/9603 11  6 - 23 mg/dL Final  . Creatinine, Ser 07/08/2015 0.89  0.40 - 1.50 mg/dL Final  . Calcium 54/08/8118 9.3  8.4 - 10.5 mg/dL Final  . GFR 14/78/2956 100.05  >60.00 mL/min Final    Physical Examination:  BP 126/72 mmHg  Pulse 72  Temp(Src) 97.6 F (36.4 C)  Resp 14  Ht  (1.727 m)  Wt 149 lb 12.8 oz (67.949 kg)  BMI 22.78 kg/m2  SpO2 99%      ASSESSMENT:  Diabetes type 2 , nonobese with inadequate control on metformin alone He is checking only fasting readings as discussed above and these are mildly increased Discussed that since his A1c is 7.3 is likely to have postprandial hyperglycemia also He is going to see the dietitian today and also discussed having consistent diet with balanced nutrients Recently has started walking again Since he has had some progression of his diabetes he would benefit from adding another drug like Januvia and discussed how this works  PLAN:   Check blood sugar at least every other day after meals, discussed blood sugar targets  Switch metformin to Janumet XR 50/1000, 2 tablets daily  Review home blood sugars in about 2 months  Consultation  with the dietitian today  Consistent exercise   [facility] 07/12/2015, 3:48 PM   Note: This office note was prepared with Dragon voice recognition system technology. Any transcriptional errors that result from this process are unintentional.

## 2015-07-12 NOTE — Patient Instructions (Signed)
Check blood sugars on waking up .. 2-3 .. times a week  Also check blood sugars about 2 hours after a meal and do this after different meals by rotation  Recommended blood sugar levels on waking up is 90-130 and about 2 hours after meal is 140-180 Please bring blood sugar monitor to each visit.  

## 2015-07-12 NOTE — Patient Instructions (Signed)
Continue working to maintain the exercise habit.  30-45 minutes most days of the week. Plan:  Aim for 4-5 Carb Choices per meal (60-75 grams) +/- 1 either way  Aim for 0-2 Carbs per snack if hungry (A handful of almonds is a good choice.) Include protein in moderation with your meals and snacks Lentils and other dried beans are a good protein choices.  Having increased vegetable curries as apposed to meat Consider reading food labels for Total Carbohydrate and Fat Grams of foods Consider checking BG at alternate times per day as directed by MD

## 2015-07-12 NOTE — Progress Notes (Signed)
Diabetes Self-Management Education  Visit Type: First/Initial  Appt. Start Time: 1415 Appt. End Time: 1530  07/12/2015  Mr. Joe Boyd, identified by name and date of birth, is a 39 y.o. male with a diagnosis of Diabetes: Type 2 (4 years).  Other people present during visit:  Patient   Patient lives with his ex wife and son.  Mother and stepfather visit patient every couple of weeks.  (Father is a retired Development worker, international aid).  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  He has [employer] in town and recently moved from [place].  He visited [place] in [place] for 1 month and ate increased sweets at that time.  He has not been exercising due to stress but started back this week and notes improvement in his energy level and sleep.  He tries to walk-jog for 35-45 minutes 5 times per week.  He is mindful of his weight control and noted that UBW is 143 lbs which recently increased to 153 lbs.  He reduced this to 149 lbs.   ASSESSMENT  Height  (1.702 m), weight 149 lb (67.586 kg). Body mass index is 23.33 kg/(m^2).  Initial Visit Information:  Are you currently following a meal plan?: No   Are you taking your medications as prescribed?: Yes Are you checking your feet?: No   How often do you need to have someone help you when you read instructions, pamphlets, or other written materials from your doctor or pharmacy?: 1 - Never What is the last grade level you completed in school?: 1 class short of master's degree  Psychosocial:     Patient Belief/Attitude about Diabetes: Motivated to manage diabetes Self-care barriers: None Self-management support: Doctor's office, Family Other persons present: Patient Patient Concerns: Nutrition/Meal planning, Healthy Lifestyle Special Needs: None Preferred Learning Style: No preference indicated Learning Readiness: Ready  Complications:   Last HgB A1C per patient/outside source: 7.3 mg/dL (1/61/09) How often do you check  your blood sugar?: 0 times/day (not testing) (only once per week) Fasting Blood glucose range (mg/dL): 604-540 Have you had a dilated eye exam in the past 12 months?: Yes Have you had a dental exam in the past 12 months?: No  Diet Intake:  Breakfast: chicken sandwich on flatbread or croissant OR 2 oldfashioned donut with coffee (10:30 or 11:00) Lunch: 3 [employer] chipate, cooked vegetables, occasional rice (4 pm) Dinner: 1-2 [employer] chipate, cooked vegetables, ocasional rice (8-9) Snack (evening): ice cream a couple of times per week Beverage(s): water or coffee with cream and splenda with occasional caremel swirl  Exercise:  Exercise: Light (walking / raking leaves), Strenuous (running) Light Exercise amount of time (min / week): 90 Strenous Exercise amount of time (min / week): 30  Individualized Plan for Diabetes Self-Management Training:   Learning Objective:  Patient will have a greater understanding of diabetes self-management.  Patient education plan per assessed needs and concerns is to attend individual sessions for     Education Topics Reviewed with Patient Today:  Definition of diabetes, type 1 and 2, and the diagnosis of diabetes, Factors that contribute to the development of diabetes Role of diet in the treatment of diabetes and the relationship between the three main macronutrients and blood glucose level, Food label reading, portion sizes and measuring food., Carbohydrate counting, Meal timing in regards to the patients' current diabetes medication., Reviewed blood glucose goals for pre and post meals and how to evaluate the patients' food intake on their blood glucose level., Information on hints  to eating out and maintain blood glucose control., Meal options for control of blood glucose level and chronic complications. Role of exercise on diabetes management, blood pressure control and cardiac health.   Taught/discussed recording of test results and interpretation of SMBG.,  Interpreting lab values - A1C, lipid, urine microalbumina.   Relationship between chronic complications and blood glucose control, Lipid levels, blood glucose control and heart disease, Assessed and discussed foot care and prevention of foot problems, Retinopathy and reason for yearly dilated eye exams, Dental care Role of stress on diabetes   Lifestyle issues that need to be addressed for better diabetes care  PATIENTS GOALS/Plan (Developed by the patient):  Nutrition: Follow meal plan discussed, General guidelines for healthy choices and portions discussed Physical Activity: Exercise 5-7 days per week, 30 minutes per day Medications: take my medication as prescribed Monitoring : test blood glucose pre and post meals as discussed Reducing Risk: examine blood glucose patterns, do foot checks daily  Plan:   Patient Instructions  Continue working to maintain the exercise habit.  30-45 minutes most days of the week. Plan:  Aim for 4-5 Carb Choices per meal (60-75 grams) +/- 1 either way  Aim for 0-2 Carbs per snack if hungry (A handful of almonds is a good choice.) Include protein in moderation with your meals and snacks Lentils and other dried beans are a good protein choices.  Having increased vegetable curries as apposed to meat Consider reading food labels for Total Carbohydrate and Fat Grams of foods Consider checking BG at alternate times per day as directed by MD    Discussed checking his blood sugar before and after meal to evaluate how that meal affected his blood sugar.  Preferred choices discussed.    Expected Outcomes:  Demonstrated interest in learning. Expect positive outcomes  Education material provided: Living Well with Diabetes, Food label handouts, A1C conversion sheet, Meal plan card and Support group flyer  Breakfast  If problems or questions, patient to contact team via:  Email  Future DSME appointment: PRN

## 2015-07-18 ENCOUNTER — Encounter: Payer: Self-pay | Admitting: Endocrinology

## 2015-09-12 ENCOUNTER — Ambulatory Visit (INDEPENDENT_AMBULATORY_CARE_PROVIDER_SITE_OTHER): Payer: 59 | Admitting: Endocrinology

## 2015-09-12 ENCOUNTER — Encounter: Payer: Self-pay | Admitting: Endocrinology

## 2015-09-12 VITALS — BP 128/82 | HR 74 | Temp 98.3°F | Resp 14 | Ht 68.0 in | Wt 151.8 lb

## 2015-09-12 DIAGNOSIS — E119 Type 2 diabetes mellitus without complications: Secondary | ICD-10-CM | POA: Diagnosis not present

## 2015-09-12 DIAGNOSIS — Z23 Encounter for immunization: Secondary | ICD-10-CM

## 2015-09-12 NOTE — Patient Instructions (Signed)
Less sweets, more protein at each meal  Check blood sugars on waking up .Marland Kitchen 2-3 .Marland Kitchen times a week Also check blood sugars about 2 hours after a meal and do this after different meals by rotation Recommended blood sugar levels on waking up is 90-130 and about 2 hours after meal is 140-180 Please bring blood sugar monitor to each visit.

## 2015-09-12 NOTE — Progress Notes (Signed)
Patient ID: Joe Boyd, male   DOB: [birth year], 39 y.o.   MRN: [id number]           Reason for Appointment: Follow-up visit for Type 2 Diabetes  Referring physician: none  History of Present Illness:          Diagnosis: Type 2 diabetes mellitus, date of diagnosis: 2012        Past history:  At time of diagnosis he was having symptoms of increased thirst and urination and he was seen by his physician when he started feeling weak and dizzy also. His A1c at diagnosis was 12.8. He thinks he was treated with metformin only and also he started changing his diet along with eliminating sweet soft drinks.  Initially was treated with only 500 mg of metformin ER and in 2014 this was increased to 1000 mg.  He has not had any other medications for diabetes. He was last seen by his endocrinologist in [place] in 07/2014 when his A1c was 6.5 and no changes were made  Recent history:  He appeared to have relatively higher blood sugars when he was first seen for this consultation in 2/16 and taking 1000 mg of metformin a day alone Also was relatively noncompliant with diet, glucose monitoring and regular follow-up  His metformin was increased to 1500 mg a day Subsequently because of higher blood sugars and A1c of over 7% he was started on Kombiglyze XR in 7/16 Despite instructions he is checking his blood sugar occasionally and none for the last month or so He has seen the dietitian but not clear if he is making enough changes, today had donuts at breakfast and only a carbohydrate type snack midday With starting Kombiglyze XR his blood sugars appear to be improving and fructosamine is upper normal       Oral hypoglycemic drugs the patient is taking are:   Kombiglyze XR 2.04/999, 2 tablets daily     Side effects from medications have been: None Compliance with the medical regimen: Fair   Glucose monitoring:  done occasionally        Glucometer:  Contour     Blood Glucose readings:   In August  fasting readings range from 102-136 and after lunch 161-200  Self-care:  Meals: 3 meals per day. Breakfast is usually a small sandwich with chicken Usually has a sandwich or roti and vegetables for lunch and again roti and vegetables for dinner.   Usually does not eat much yogurt, sometimes has chicken.    Usually avoiding snacks          Exercise: 3-4/7 days,  walking 35 minutes         Dietician visit, most recent: 7/16                Weight history: 140-148  Wt Readings from Last 3 Encounters:  09/12/15 151 lb 12.8 oz (68.856 kg)  07/12/15 149 lb (67.586 kg)  07/12/15 149 lb 12.8 oz (67.949 kg)    Glycemic control:  A1c in 07/2014 was 6.5  Lab Results  Component Value Date   HGBA1C 7.3* 07/08/2015   HGBA1C 7.4* 02/22/2015   Lab Results  Component Value Date   MICROALBUR 1.4 02/22/2015   LDLCALC 84 02/22/2015   CREATININE 0.89 07/08/2015        Medication List       This list is accurate as of: 09/12/15 11:59 PM.  Always use your most recent med list.  glucose blood test strip  Commonly known as:  BAYER CONTOUR TEST  Test once every 3 days.     multivitamin with minerals Tabs tablet  Take 1 tablet by mouth daily.     Saxagliptin-Metformin 2.04-999 MG Tb24  Commonly known as:  KOMBIGLYZE XR  Take 2 tablets daily        Allergies: No Known Allergies  Past Medical History  Diagnosis Date  . Diabetes mellitus without complication   . Tuberculosis     Finished Rx in 8/15    No past surgical history on file.  Family History  Problem Relation Age of Onset  . Diabetes Mother   . Hypertension Mother   . Thyroid disease Mother   . Cancer Neg Hx   . Diabetes Father   . Hypertension Father     Social History:  reports that he has never smoked. He has never used smokeless tobacco. He reports that he drinks alcohol. He reports that he does not use illicit drugs.    Review of Systems      Most recent eye exam was over a year ago          Lipids:  LDL and triglyceride normal in 2013 and in 2014 LDL was 69, direct LDL 99.  Not on treatment        Lab Results  Component Value Date   CHOL 149 02/22/2015   HDL 35.70* 02/22/2015   LDLCALC 84 02/22/2015   TRIG 145.0 02/22/2015   CHOLHDL 4 02/22/2015                  Thyroid: Has a small goiter  Lab Results  Component Value Date   TSH 0.78 02/22/2015      LABS:  Office Visit on 09/12/2015  Component Date Value Ref Range Status  . Fructosamine 09/12/2015 279  0 - 285 umol/L Final   Comment: Published reference interval for apparently healthy subjects between age 43 and 72 is 44 - 285 umol/L and in a poorly controlled diabetic population is 228 - 563 umol/L with a mean of 396 umol/L.     Physical Examination:  BP 128/82 mmHg  Pulse 74  Temp(Src) 98.3 F (36.8 C)  Resp 14  Ht 5\' 8"  (1.727 m)  Wt 151 lb 12.8 oz (68.856 kg)  BMI 23.09 kg/m2  SpO2 97%      ASSESSMENT:  Diabetes type 2 , nonobese with inadequate control on metformin alone, now doing better with Kombiglyze XR He still is not watching his diet as consistently and having unbalanced meals with more carbohydrate including today when his glucose is 179 nonfasting Has not checked his readings in over a month Although he is otherwise doing fairly well with exercise he has not lost any weight  PLAN:   Check blood sugar at least every other day after meals, discussed blood sugar targets  No change in Kombiglyze XR  Balanced meals with low amount of carbohydrates, more protein and less sugar  Consistent exercise   Kiannah Grunow 09/13/2015, 4:24 PM   Note: This office note was prepared with Dragon voice recognition system technology. Any transcriptional errors that result from this process are unintentional.

## 2015-09-13 ENCOUNTER — Other Ambulatory Visit: Payer: Self-pay

## 2015-09-13 DIAGNOSIS — E119 Type 2 diabetes mellitus without complications: Secondary | ICD-10-CM

## 2015-09-13 LAB — BASIC METABOLIC PANEL
BUN: 14 mg/dL (ref 6–23)
CALCIUM: 9.6 mg/dL (ref 8.4–10.5)
CHLORIDE: 104 meq/L (ref 96–112)
CO2: 32 meq/L (ref 19–32)
CREATININE: 1.01 mg/dL (ref 0.40–1.50)
GFR: 86.39 mL/min (ref >60.00)
Glucose, Bld: 114 mg/dL — ABNORMAL HIGH (ref 70–99)
Potassium: 4.8 mEq/L (ref 3.5–5.1)
Sodium: 140 mEq/L (ref 135–145)

## 2015-09-13 LAB — FRUCTOSAMINE: Fructosamine: 279 umol/L (ref 0–285)

## 2015-09-26 ENCOUNTER — Other Ambulatory Visit: Payer: Self-pay | Admitting: Endocrinology

## 2015-11-05 ENCOUNTER — Telehealth: Payer: Self-pay | Admitting: Endocrinology

## 2015-11-05 NOTE — Telephone Encounter (Signed)
Patient need refill of Saxagliptin-Metformin (KOMBIGLYZE XR) 2.04-999 MG TB24 45 day supply, send to  [facility] [address] - [place], [place] - [address] [phone] (Phone) [phone] (Fax)

## 2015-11-06 ENCOUNTER — Other Ambulatory Visit: Payer: Self-pay | Admitting: *Deleted

## 2015-11-06 MED ORDER — SAXAGLIPTIN-METFORMIN ER 2.5-1000 MG PO TB24: ORAL_TABLET | ORAL | Status: DC

## 2015-11-06 NOTE — Telephone Encounter (Signed)
rx sent

## 2015-11-07 ENCOUNTER — Other Ambulatory Visit: Payer: 59

## 2015-11-13 ENCOUNTER — Ambulatory Visit: Payer: 59 | Admitting: Endocrinology

## 2015-12-24 ENCOUNTER — Other Ambulatory Visit (INDEPENDENT_AMBULATORY_CARE_PROVIDER_SITE_OTHER): Payer: 59

## 2015-12-24 DIAGNOSIS — E119 Type 2 diabetes mellitus without complications: Secondary | ICD-10-CM | POA: Diagnosis not present

## 2015-12-24 LAB — BASIC METABOLIC PANEL WITH GFR
BUN: 11 mg/dL (ref 7–25)
CALCIUM: 9.7 mg/dL (ref 8.6–10.3)
CO2: 29 mmol/L (ref 20–31)
Chloride: 100 mmol/L (ref 98–110)
Creat: 0.87 mg/dL (ref 0.60–1.35)
GFR, Est African American: >89 mL/min (ref >=60)
Glucose, Bld: 105 mg/dL — ABNORMAL HIGH (ref 65–99)
POTASSIUM: 5 mmol/L (ref 3.5–5.3)
SODIUM: 138 mmol/L (ref 135–146)

## 2015-12-24 LAB — COMPREHENSIVE METABOLIC PANEL
ALBUMIN: 4.5 g/dL (ref 3.5–5.2)
ALK PHOS: 59 U/L (ref 39–117)
ALT: 16 U/L (ref 0–53)
AST: 17 U/L (ref 0–37)
BUN: 13 mg/dL (ref 6–23)
CALCIUM: 9.8 mg/dL (ref 8.4–10.5)
CHLORIDE: 103 meq/L (ref 96–112)
CO2: 31 mEq/L (ref 19–32)
Creatinine, Ser: 0.94 mg/dL (ref 0.40–1.50)
GFR: 93.72 mL/min (ref >60.00)
Glucose, Bld: 119 mg/dL — ABNORMAL HIGH (ref 70–99)
POTASSIUM: 4.9 meq/L (ref 3.5–5.1)
SODIUM: 140 meq/L (ref 135–145)
TOTAL PROTEIN: 7.7 g/dL (ref 6.0–8.3)
Total Bilirubin: 0.6 mg/dL (ref 0.2–1.2)

## 2015-12-24 LAB — HEMOGLOBIN A1C: Hgb A1c MFr Bld: 6.9 % — ABNORMAL HIGH (ref 4.6–6.5)

## 2015-12-27 ENCOUNTER — Other Ambulatory Visit: Payer: 59

## 2015-12-27 ENCOUNTER — Encounter: Payer: Self-pay | Admitting: Endocrinology

## 2015-12-27 ENCOUNTER — Ambulatory Visit (INDEPENDENT_AMBULATORY_CARE_PROVIDER_SITE_OTHER): Payer: 59 | Admitting: Endocrinology

## 2015-12-27 VITALS — BP 125/78 | HR 83 | Temp 98.4°F | Resp 14 | Ht 68.0 in | Wt 149.0 lb

## 2015-12-27 DIAGNOSIS — E049 Nontoxic goiter, unspecified: Secondary | ICD-10-CM | POA: Diagnosis not present

## 2015-12-27 DIAGNOSIS — E119 Type 2 diabetes mellitus without complications: Secondary | ICD-10-CM | POA: Diagnosis not present

## 2015-12-27 DIAGNOSIS — R748 Abnormal levels of other serum enzymes: Secondary | ICD-10-CM

## 2015-12-27 LAB — POCT GLUCOSE (DEVICE FOR HOME USE): POC Glucose: 181 mg/dl — AB (ref 70–99)

## 2015-12-27 NOTE — Progress Notes (Signed)
Patient ID: Joe Boyd, male   DOB: [birth year], 39 y.o.   MRN: [id number]           Reason for Appointment: Follow-up visit for Type 2 Diabetes  Referring physician: none  History of Present Illness:          Diagnosis: Type 2 diabetes mellitus, date of diagnosis: 2012        Past history:  At time of diagnosis he was having symptoms of increased thirst and urination and he was seen by his physician when he started feeling weak and dizzy also. His A1c at diagnosis was 12.8. He thinks he was treated with metformin only and also he started changing his diet along with eliminating sweet soft drinks.  Initially was treated with only 500 mg of metformin ER and in 2014 this was increased to 1000 mg.  He has not had any other medications for diabetes. He was last seen by his endocrinologist in [place] in 07/2014 when his A1c was 6.5 and no changes were made  Recent history:   With higher blood sugars and A1c of over 7% he was started on Kombiglyze XR in 7/16 Despite instructions he is checking his blood sugar occasionally and only sporadically However his A1c is now improved at 6.9% He has seen the dietitian previously; he has done fairly well with his diet even when traveling outside the country Also fairly active       Oral hypoglycemic drugs the patient is taking are:   Kombiglyze XR 2.04/999, 2 tablets daily     Side effects from medications have been: None Compliance with the medical regimen: Fair   Glucose monitoring:  done occasionally        Glucometer:  Contour     Blood Glucose readings: PC about 145, acb 112  Self-care:  Meals: 3 meals per day. Breakfast is usually a small sandwich with chicken Usually has a sandwich or roti and vegetables for lunch and again roti and vegetables for dinner.   Usually does not eat much yogurt, sometimes has chicken.    Usually avoiding snacks          Exercise: 5/7 days,  walking 35 minutes         Dietician visit, most recent: 7/16                 Weight history: 140-148  Wt Readings from Last 3 Encounters:  12/27/15 149 lb (67.586 kg)  09/12/15 151 lb 12.8 oz (68.856 kg)  07/12/15 149 lb (67.586 kg)    Glycemic control:  A1c in 07/2014 was 6.5  Lab Results  Component Value Date   HGBA1C 6.9* 12/24/2015   HGBA1C 7.3* 07/08/2015   HGBA1C 7.4* 02/22/2015   Lab Results  Component Value Date   MICROALBUR 1.4 02/22/2015   LDLCALC 84 02/22/2015   CREATININE 0.87 12/24/2015   CREATININE 0.94 12/24/2015        Medication List       This list is accurate as of: 12/27/15  3:07 PM.  Always use your most recent med list.               glucose blood test strip  Commonly known as:  BAYER CONTOUR TEST  Test once every 3 days.     metFORMIN 500 MG 24 hr tablet  Commonly known as:  GLUCOPHAGE-XR  TAKE ONE TABLET BY MOUTH TWICE DAILY     multivitamin with minerals Tabs tablet  Take 1 tablet by  mouth daily.     Saxagliptin-Metformin 2.04-999 MG Tb24  Commonly known as:  KOMBIGLYZE XR  Take 2 tablets daily        Allergies: No Known Allergies  Past Medical History  Diagnosis Date  . Diabetes mellitus without complication ([place])   . Tuberculosis     Finished Rx in 8/15    No past surgical history on file.  Family History  Problem Relation Age of Onset  . Diabetes Mother   . Hypertension Mother   . Thyroid disease Mother   . Cancer Neg Hx   . Diabetes Father   . Hypertension Father     Social History:  reports that he has never smoked. He has never used smokeless tobacco. He reports that he drinks alcohol. He reports that he does not use illicit drugs.    Review of Systems         Lipids:  LDL and triglyceride normal HDL relatively low      Lab Results  Component Value Date   CHOL 149 02/22/2015   HDL 35.70* 02/22/2015   LDLCALC 84 02/22/2015   TRIG 145.0 02/22/2015   CHOLHDL 4 02/22/2015                  Thyroid: Has a small goiter  Lab Results  Component Value Date    TSH 0.78 02/22/2015      LABS:  Office Visit on 12/27/2015  Component Date Value Ref Range Status  . POC Glucose 12/27/2015 181* 70 - 99 mg/dl Final  Lab on 12/24/2015  Component Date Value Ref Range Status  . Sodium 12/24/2015 138  135 - 146 mmol/L Final  . Potassium 12/24/2015 5.0  3.5 - 5.3 mmol/L Final  . Chloride 12/24/2015 100  98 - 110 mmol/L Final  . CO2 12/24/2015 29  20 - 31 mmol/L Final  . Glucose, Bld 12/24/2015 105* 65 - 99 mg/dL Final  . BUN 12/24/2015 11  7 - 25 mg/dL Final  . Creat 12/24/2015 0.87  0.60 - 1.35 mg/dL Final  . Calcium 12/24/2015 9.7  8.6 - 10.3 mg/dL Final  . GFR, Est African American 12/24/2015 >89  >=60 mL/min Final  . GFR, Est Non African American 12/24/2015 >89  >=60 mL/min Final   Comment:   The estimated GFR is a calculation valid for adults (>=85 years old) that uses the CKD-EPI algorithm to adjust for age and sex. It is   not to be used for children, pregnant women, hospitalized patients,    patients on dialysis, or with rapidly changing kidney function. According to the NKDEP, eGFR >89 is normal, 60-89 shows mild impairment, 30-59 shows moderate impairment, 15-29 shows severe impairment and <15 is ESRD.     Marland Kitchen Hgb A1c MFr Bld 12/24/2015 6.9* 4.6 - 6.5 % Final   Glycemic Control Guidelines for People with Diabetes:Non Diabetic:  <6%Goal of Therapy: <7%Additional Action Suggested:  >8%   . Sodium 12/24/2015 140  135 - 145 mEq/L Final  . Potassium 12/24/2015 4.9  3.5 - 5.1 mEq/L Final  . Chloride 12/24/2015 103  96 - 112 mEq/L Final  . CO2 12/24/2015 31  19 - 32 mEq/L Final  . Glucose, Bld 12/24/2015 119* 70 - 99 mg/dL Final  . BUN 12/24/2015 13  6 - 23 mg/dL Final  . Creatinine, Ser 12/24/2015 0.94  0.40 - 1.50 mg/dL Final  . Total Bilirubin 12/24/2015 0.6  0.2 - 1.2 mg/dL Final  . Alkaline Phosphatase 12/24/2015 59  39 - 117 U/L Final  . AST 12/24/2015 17  0 - 37 U/L Final  . ALT 12/24/2015 16  0 - 53 U/L Final  . Total Protein  12/24/2015 7.7  6.0 - 8.3 g/dL Final  . Albumin 12/24/2015 4.5  3.5 - 5.2 g/dL Final  . Calcium 12/24/2015 9.8  8.4 - 10.5 mg/dL Final  . GFR 12/24/2015 93.72  >60.00 mL/min Final    Physical Examination:  BP 154/76 mmHg  Pulse 83  Temp(Src) 98.4 F (36.9 C)  Resp 14  Ht _0  (1.727 m)  Wt 149 lb (67.586 kg)  BMI 22.66 kg/m2  SpO2 99%  He has a small goiter especially on the right side    ASSESSMENT:  Diabetes type 2 , nonobese with improving controlled now He has improved his diet overall then has been active No side effects with taking maximum dose Kombiglyze XR However not checking blood sugars very much at home but he reports fairly good readings even after meals recently Fasting labs show glucose of 119 in the office  PLAN:   Check blood sugar at least every other day after meals, discussed blood sugar targets  No change in Kombiglyze XR  Balanced meals with low amount of carbohydrates, more protein    Joe Boyd 12/27/2015, 3:07 PM   Note: This office note was prepared with Estate agent. Any transcriptional errors that result from this process are unintentional.

## 2015-12-27 NOTE — Patient Instructions (Signed)
Check blood sugars on waking up 2  times a week  Also check blood sugars about 2 hours after a meal and do this after different meals by rotation  Recommended blood sugar levels on waking up is 90-130 and about 2 hours after meal is 130-160  Please bring your blood sugar monitor to each visit, thank you  

## 2016-01-21 ENCOUNTER — Telehealth: Payer: Self-pay | Admitting: Endocrinology

## 2016-01-21 NOTE — Telephone Encounter (Signed)
Patient ask you to send his medication Saxagliptin-Metformin (KOMBIGLYZE XR) 2.04-999 MG TB24 to the [facility] in [place] phone # [phone]

## 2016-01-22 ENCOUNTER — Ambulatory Visit (INDEPENDENT_AMBULATORY_CARE_PROVIDER_SITE_OTHER): Payer: BLUE CROSS/BLUE SHIELD | Admitting: Nurse Practitioner

## 2016-01-22 ENCOUNTER — Encounter: Payer: Self-pay | Admitting: Nurse Practitioner

## 2016-01-22 ENCOUNTER — Other Ambulatory Visit: Payer: Self-pay | Admitting: *Deleted

## 2016-01-22 VITALS — BP 134/88 | HR 66 | Temp 97.7°F | Resp 18 | Ht 68.0 in | Wt 149.0 lb

## 2016-01-22 DIAGNOSIS — R21 Rash and other nonspecific skin eruption: Secondary | ICD-10-CM

## 2016-01-22 MED ORDER — SAXAGLIPTIN-METFORMIN ER 2.5-1000 MG PO TB24: ORAL_TABLET | ORAL | Status: DC

## 2016-01-22 MED ORDER — TRIAMCINOLONE ACETONIDE 0.1 % EX CREA: 1.0000 "application " | TOPICAL_CREAM | Freq: Two times a day (BID) | CUTANEOUS | Status: DC

## 2016-01-22 NOTE — Telephone Encounter (Signed)
rx sent

## 2016-01-22 NOTE — Progress Notes (Signed)
Patient ID: Joe Boyd, male    DOB: 1975/12/31  Age: 40 y.o. MRN: [id number]  CC: Urticaria   HPI Joe Boyd presents for CC of Rash x 1 week.   1) Rash- x 1 week, pt reports red spots that just popped up and keep popping up. Joe Boyd denies trouble breathing/swallowing. Treatment to date includes home creams/lotions to moisturize. Pt reports pruritus. Pt reports starting a new multi vitamin, but recently stopped it x 2-3 days. Washed all sheets and has no signs of bugs in home. Pt also reports drinking milk recently, which Joe Boyd has not done in years.   History Joe Boyd has a past medical history of Diabetes mellitus without complication (HCC) and Tuberculosis.   Joe Boyd has no past surgical history on file.   His family history includes Diabetes in his father and mother; Hypertension in his father and mother; Thyroid disease in his mother. There is no history of Cancer or Heart disease.Joe Boyd reports that Joe Boyd has never smoked. Joe Boyd has never used smokeless tobacco. Joe Boyd reports that Joe Boyd drinks alcohol. Joe Boyd reports that Joe Boyd does not use illicit drugs.  Outpatient Prescriptions Prior to Visit  Medication Sig Dispense Refill  . glucose blood (BAYER CONTOUR TEST) test strip Test once every 3 days. 100 each 5  . Multiple Vitamin (MULTIVITAMIN WITH MINERALS) TABS tablet Take 1 tablet by mouth daily.    . Saxagliptin-Metformin (KOMBIGLYZE XR) 2.04-999 MG TB24 Take 2 tablets daily 60 tablet 3   No facility-administered medications prior to visit.    ROS Review of Systems  Constitutional: Negative for fever, chills, diaphoresis and fatigue.  HENT: Negative for trouble swallowing.   Eyes: Negative for visual disturbance.  Respiratory: Negative for cough, choking, chest tightness, shortness of breath and wheezing.   Cardiovascular: Negative for chest pain, palpitations and leg swelling.  Gastrointestinal: Negative for nausea, vomiting and diarrhea.  Skin: Positive for rash.  Neurological: Negative for  dizziness.    Objective:  BP 134/88 mmHg  Pulse 66  Temp(Src) 97.7 F (36.5 C) (Oral)  Resp 18  Ht  (1.727 m)  Wt 149 lb (67.586 kg)  BMI 22.66 kg/m2  SpO2 99%  Physical Exam  Constitutional: Joe Boyd is oriented to person, place, and time. Joe Boyd appears well-developed and well-nourished. No distress.  HENT:  Head: Normocephalic and atraumatic.  Right Ear: External ear normal.  Left Ear: External ear normal.  Mouth/Throat: Oropharynx is clear and moist. No oropharyngeal exudate.  No edema of oropharynx or tongue  Eyes: Conjunctivae and EOM are normal. Pupils are equal, round, and reactive to light. Right eye exhibits no discharge. Left eye exhibits no discharge. No scleral icterus.  Neck: Normal range of motion. Neck supple.  Cardiovascular: Normal rate, regular rhythm and normal heart sounds.   Pulmonary/Chest: Effort normal and breath sounds normal.  Lymphadenopathy:    Joe Boyd has no cervical adenopathy.  Neurological: Joe Boyd is alert and oriented to person, place, and time.  Skin: Skin is warm and dry. Rash noted. Joe Boyd is not diaphoretic.  Generalized urticaria less than 7 lesions found, erythematous, indurated, arms and neck   Assessment & Plan:   There are no diagnoses linked to this encounter. I am having Mr. Espina start on triamcinolone cream. I am also having him maintain his glucose blood, multivitamin with minerals, and Saxagliptin-Metformin.  Meds ordered this encounter  Medications  . triamcinolone cream (KENALOG) 0.1 %    Sig: Apply 1 application topically 2 (two) times daily.    Dispense:  30 g  Refill:  0    Order Specific Question:  Supervising Provider    Answer:  Sherlene Shams [[id number]]     Follow-up: No Follow-up on file.

## 2016-01-22 NOTE — Progress Notes (Signed)
Pre visit review using our clinic review tool, if applicable. No additional management support is needed unless otherwise documented below in the visit note. 

## 2016-01-22 NOTE — Patient Instructions (Signed)
Benadryl over the counter (follow the instructions per the kind you get comes in liquid, pill form ect...)  Use the cream as instructed   Rash A rash is a change in the color or texture of the skin. There are many different types of rashes. You may have other problems that accompany your rash. CAUSES   Infections.  Allergic reactions. This can include allergies to pets or foods.  Certain medicines.  Exposure to certain chemicals, soaps, or cosmetics.  Heat.  Exposure to poisonous plants.  Tumors, both cancerous and noncancerous. SYMPTOMS   Redness.  Scaly skin.  Itchy skin.  Dry or cracked skin.  Bumps.  Blisters.  Pain. DIAGNOSIS  Your caregiver may do a physical exam to determine what type of rash you have. A skin sample (biopsy) may be taken and examined under a microscope. TREATMENT  Treatment depends on the type of rash you have. Your caregiver may prescribe certain medicines. For serious conditions, you may need to see a skin doctor (dermatologist). HOME CARE INSTRUCTIONS   Avoid the substance that caused your rash.  Do not scratch your rash. This can cause infection.  You may take cool baths to help stop itching.  Only take over-the-counter or prescription medicines as directed by your caregiver.  Keep all follow-up appointments as directed by your caregiver. SEEK IMMEDIATE MEDICAL CARE IF:  You have increasing pain, swelling, or redness.  You have a fever.  You have new or severe symptoms.  You have body aches, diarrhea, or vomiting.  Your rash is not better after 3 days. MAKE SURE YOU:  Understand these instructions.  Will watch your condition.  Will get help right away if you are not doing well or get worse.   This information is not intended to replace advice given to you by your health care provider. Make sure you discuss any questions you have with your health care provider.   Document Released: 12/04/2002 Document Revised:  01/04/2015 Document Reviewed: 05/01/2015 Elsevier Interactive Patient Education [employer]

## 2016-01-26 DIAGNOSIS — R21 Rash and other nonspecific skin eruption: Secondary | ICD-10-CM | POA: Insufficient documentation

## 2016-01-26 NOTE — Assessment & Plan Note (Addendum)
Pt is very well appearing today  New onset Kenalog was sent to pharmacy for pruritis and only to be used on those areas. Advised benadryl at night.  Asked him to cut out milk and continue to stop the multivitamin. FU prn worsening/failure to improve.

## 2016-03-19 ENCOUNTER — Encounter: Payer: BLUE CROSS/BLUE SHIELD | Admitting: Family

## 2016-03-25 ENCOUNTER — Encounter: Payer: Self-pay | Admitting: Family

## 2016-03-25 ENCOUNTER — Ambulatory Visit (INDEPENDENT_AMBULATORY_CARE_PROVIDER_SITE_OTHER): Payer: BLUE CROSS/BLUE SHIELD | Admitting: Family

## 2016-03-25 VITALS — BP 120/70 | HR 92 | Temp 100.5°F | Ht 67.0 in | Wt 152.0 lb

## 2016-03-25 DIAGNOSIS — J111 Influenza due to unidentified influenza virus with other respiratory manifestations: Secondary | ICD-10-CM | POA: Diagnosis not present

## 2016-03-25 LAB — POCT INFLUENZA A/B
INFLUENZA A, POC: NEGATIVE
Influenza B, POC: NEGATIVE

## 2016-03-25 MED ORDER — OSELTAMIVIR PHOSPHATE 75 MG PO CAPS: 75.0000 mg | ORAL_CAPSULE | Freq: Two times a day (BID) | ORAL | Status: DC

## 2016-03-25 NOTE — Progress Notes (Signed)
Pre visit review using our clinic review tool, if applicable. No additional management support is needed unless otherwise documented below in the visit note. 

## 2016-03-25 NOTE — Patient Instructions (Addendum)
Thank you for choosing [facility].  Summary/Instructions:  Your prescription(s) have been submitted to your pharmacy or been printed and provided for you. Please take as directed and contact our office if you believe you are having problem(s) with the medication(s) or have any questions.  If your symptoms worsen or fail to improve, please contact our office for further instruction, or in case of emergency go directly to the emergency room at the closest medical facility.   Influenza, Adult Influenza ("the flu") is a viral infection of the respiratory tract. It occurs more often in winter months because people spend more time in close contact with one another. Influenza can make you feel very sick. Influenza easily spreads from person to person (contagious). CAUSES  Influenza is caused by a virus that infects the respiratory tract. You can catch the virus by breathing in droplets from an infected person's cough or sneeze. You can also catch the virus by touching something that was recently contaminated with the virus and then touching your mouth, nose, or eyes. RISKS AND COMPLICATIONS You may be at risk for a more severe case of influenza if you smoke cigarettes, have diabetes, have chronic heart disease (such as heart failure) or lung disease (such as asthma), or if you have a weakened immune system. Elderly people and pregnant women are also at risk for more serious infections. The most common problem of influenza is a lung infection (pneumonia). Sometimes, this problem can require emergency medical care and may be life threatening. SIGNS AND SYMPTOMS  Symptoms typically last 4 to 10 days and may include:  Fever.  Chills.  Headache, body aches, and muscle aches.  Sore throat.  Chest discomfort and cough.  Poor appetite.  Weakness or feeling tired.  Dizziness.  Nausea or vomiting. DIAGNOSIS  Diagnosis of influenza is often made based on your history and a physical exam. A  nose or throat swab test can be done to confirm the diagnosis. TREATMENT  In mild cases, influenza goes away on its own. Treatment is directed at relieving symptoms. For more severe cases, your health care provider may prescribe antiviral medicines to shorten the sickness. Antibiotic medicines are not effective because the infection is caused by a virus, not by bacteria. HOME CARE INSTRUCTIONS  Take medicines only as directed by your health care provider.  Use a cool mist humidifier to make breathing easier.  Get plenty of rest until your temperature returns to normal. This usually takes 3 to 4 days.  Drink enough fluid to keep your urine clear or pale yellow.  Cover yourmouth and nosewhen coughing or sneezing,and wash your handswellto prevent thevirusfrom spreading.  Stay homefromwork orschool untilthe fever is gonefor at least 151full day. PREVENTION  An annual influenza vaccination (flu shot) is the best way to avoid getting influenza. An annual flu shot is now routinely recommended for all adults in the [place] SEEK MEDICAL CARE IF:  You experiencechest pain, yourcough worsens,or you producemore mucus.  Youhave nausea,vomiting, ordiarrhea.  Your fever returns or gets worse. SEEK IMMEDIATE MEDICAL CARE IF:  You havetrouble breathing, you become short of breath,or your skin ornails becomebluish.  You have severe painor stiffnessin the neck.  You develop a sudden headache, or pain in the face or ear.  You have nausea or vomiting that you cannot control. MAKE SURE YOU:   Understand these instructions.  Will watch your condition.  Will get help right away if you are not doing well or get worse.   This information  is not intended to replace advice given to you by your health care provider. Make sure you discuss any questions you have with your health care provider.   Document Released: 12/11/2000 Document Revised: 01/04/2015 Document Reviewed:  03/14/2012 Elsevier Interactive Patient Education [employer]

## 2016-03-25 NOTE — Assessment & Plan Note (Signed)
Influenza test positive. Start Tamiflu. Continue over-the-counter medications as needed for symptom relief and supportive care. Follow-up if symptoms worsen or fail to improve.

## 2016-03-25 NOTE — Progress Notes (Signed)
Subjective:    Patient ID: Joe Boyd, male    DOB: Dec 10, 1976, 40 y.o.   MRN: [id number]  Chief Complaint  Patient presents with  . Influenza    flu-like sxs x 4 days, productive cough (yellow), fever, chills, sneezing, diarrhea, and body aches.  Recent sick contact son was Dx with virus.    HPI:  Joe Boyd is a 40 y.o. male who  has a past medical history of Diabetes mellitus without complication (HCC) and Tuberculosis. and presents today For an acute office visit.  This is a new problem. Associated symptoms of productive cough, fever, chills, sneezing, diarrhea, and body aches hemming going on for approximately 3 days. Son was recently diagnosed with a virus. Modifying factors include Claratin-D, Tylenol, and Robitussin which have helped minimally with the symptoms.  No Known Allergies   Current Outpatient Prescriptions on File Prior to Visit  Medication Sig Dispense Refill  . glucose blood (BAYER CONTOUR TEST) test strip Test once every 3 days. 100 each 5  . Multiple Vitamin (MULTIVITAMIN WITH MINERALS) TABS tablet Take 1 tablet by mouth daily.    . Saxagliptin-Metformin (KOMBIGLYZE XR) 2.04-999 MG TB24 Take 2 tablets daily 60 tablet 3  . triamcinolone cream (KENALOG) 0.1 % Apply 1 application topically 2 (two) times daily. (Patient not taking: Reported on 03/25/2016) 30 g 0   No current facility-administered medications on file prior to visit.    Past Medical History  Diagnosis Date  . Diabetes mellitus without complication (HCC)   . Tuberculosis     Finished Rx in 8/15    Review of Systems  Constitutional: Positive for fever and chills.  HENT: Positive for congestion, sneezing and sore throat.   Respiratory: Positive for cough. Negative for chest tightness and shortness of breath.   Cardiovascular: Negative for chest pain and leg swelling.  Gastrointestinal: Positive for abdominal pain and diarrhea.  Neurological: Positive for headaches.        Objective:    BP 120/70 mmHg  Pulse 92  Temp(Src) 100.5 F (38.1 C) (Oral)  Ht 5\' 7"  (1.702 m)  Wt 152 lb (68.947 kg)  BMI 23.80 kg/m2  SpO2 99% Nursing note and vital signs reviewed.  Physical Exam  Constitutional: He is oriented to person, place, and time. He appears well-developed and well-nourished. No distress.  HENT:  Right Ear: Hearing, tympanic membrane, external ear and ear canal normal.  Left Ear: Hearing, tympanic membrane, external ear and ear canal normal.  Nose: Nose normal. Right sinus exhibits no maxillary sinus tenderness and no frontal sinus tenderness. Left sinus exhibits no maxillary sinus tenderness and no frontal sinus tenderness.  Cardiovascular: Normal rate, regular rhythm, normal heart sounds and intact distal pulses.   Pulmonary/Chest: Effort normal and breath sounds normal.  Neurological: He is alert and oriented to person, place, and time.  Skin: Skin is warm and dry.  Psychiatric: He has a normal mood and affect. His behavior is normal. Judgment and thought content normal.      Assessment & Plan:   Problem List Items Addressed This Visit      Respiratory   Influenza - Primary    Influenza test positive. Start Tamiflu. Continue over-the-counter medications as needed for symptom relief and supportive care. Follow-up if symptoms worsen or fail to improve.      Relevant Medications   oseltamivir (TAMIFLU) 75 MG capsule   Other Relevant Orders   POCT Influenza A/B (Completed)     I am having Mr. Joe Boyd start  on oseltamivir. I am also having him maintain his glucose blood, multivitamin with minerals, Saxagliptin-Metformin, and triamcinolone cream.

## 2016-04-06 ENCOUNTER — Ambulatory Visit (INDEPENDENT_AMBULATORY_CARE_PROVIDER_SITE_OTHER): Payer: BLUE CROSS/BLUE SHIELD | Admitting: Family

## 2016-04-06 ENCOUNTER — Other Ambulatory Visit (INDEPENDENT_AMBULATORY_CARE_PROVIDER_SITE_OTHER): Payer: BLUE CROSS/BLUE SHIELD

## 2016-04-06 ENCOUNTER — Encounter: Payer: Self-pay | Admitting: Family

## 2016-04-06 VITALS — BP 112/88 | HR 63 | Temp 97.7°F | Resp 16 | Ht 68.0 in | Wt 148.0 lb

## 2016-04-06 DIAGNOSIS — Z23 Encounter for immunization: Secondary | ICD-10-CM

## 2016-04-06 DIAGNOSIS — R718 Other abnormality of red blood cells: Secondary | ICD-10-CM

## 2016-04-06 DIAGNOSIS — E875 Hyperkalemia: Secondary | ICD-10-CM

## 2016-04-06 DIAGNOSIS — Z0000 Encounter for general adult medical examination without abnormal findings: Secondary | ICD-10-CM

## 2016-04-06 LAB — CBC
HEMATOCRIT: 42.3 % (ref 39.0–52.0)
Hemoglobin: 13.3 g/dL (ref 13.0–17.0)
MCHC: 31.5 g/dL (ref 30.0–36.0)
Platelets: 316 10*3/uL (ref 150.0–400.0)
RBC: 6.34 Mil/uL — ABNORMAL HIGH (ref 4.22–5.81)
RDW: 14.8 % (ref 11.5–15.5)
WBC: 7.1 10*3/uL (ref 4.0–10.5)

## 2016-04-06 LAB — COMPREHENSIVE METABOLIC PANEL
ALT: 27 U/L (ref 0–53)
AST: 23 U/L (ref 0–37)
Albumin: 4.8 g/dL (ref 3.5–5.2)
Alkaline Phosphatase: 61 U/L (ref 39–117)
BUN: 17 mg/dL (ref 6–23)
CHLORIDE: 104 meq/L (ref 96–112)
CO2: 31 meq/L (ref 19–32)
Calcium: 10.3 mg/dL (ref 8.4–10.5)
Creatinine, Ser: 1.03 mg/dL (ref 0.40–1.50)
GFR: 84.22 mL/min (ref >60.00)
GLUCOSE: 129 mg/dL — AB (ref 70–99)
POTASSIUM: 5.5 meq/L — AB (ref 3.5–5.1)
SODIUM: 141 meq/L (ref 135–145)
Total Bilirubin: 0.8 mg/dL (ref 0.2–1.2)
Total Protein: 8.1 g/dL (ref 6.0–8.3)

## 2016-04-06 LAB — TSH: TSH: 0.4 u[IU]/mL (ref 0.35–4.50)

## 2016-04-06 LAB — LIPID PANEL
Cholesterol: 147 mg/dL (ref 0–200)
HDL: 39.6 mg/dL (ref >39.00)
LDL CALC: 91 mg/dL (ref 0–99)
NONHDL: 107.27
Total CHOL/HDL Ratio: 4
Triglycerides: 82 mg/dL (ref 0.0–149.0)
VLDL: 16.4 mg/dL (ref 0.0–40.0)

## 2016-04-06 NOTE — Patient Instructions (Signed)
Thank you for choosing [facility].  Summary/Instructions:  Please stop by the lab on the basement level of the building for your blood work. Your results will be released to MyChart (or called to you) after review, usually within 72 hours after test completion. If any changes need to be made, you will be notified at that same time.  Health Maintenance, Male A healthy lifestyle and preventative care can promote health and wellness.  Maintain regular health, dental, and eye exams.  Eat a healthy diet. Foods like vegetables, fruits, whole grains, low-fat dairy products, and lean protein foods contain the nutrients you need and are low in calories. Decrease your intake of foods high in solid fats, added sugars, and salt. Get information about a proper diet from your health care provider, if necessary.  Regular physical exercise is one of the most important things you can do for your health. Most adults should get at least 150 minutes of moderate-intensity exercise (any activity that increases your heart rate and causes you to sweat) each week. In addition, most adults need muscle-strengthening exercises on 2 or more days a week.   Maintain a healthy weight. The body mass index (BMI) is a screening tool to identify possible weight problems. It provides an estimate of body fat based on height and weight. Your health care provider can find your BMI and can help you achieve or maintain a healthy weight. For males 20 years and older:  A BMI below 18.5 is considered underweight.  A BMI of 18.5 to 24.9 is normal.  A BMI of 25 to 29.9 is considered overweight.  A BMI of 30 and above is considered obese.  Maintain normal blood lipids and cholesterol by exercising and minimizing your intake of saturated fat. Eat a balanced diet with plenty of fruits and vegetables. Blood tests for lipids and cholesterol should begin at age 20 and be repeated every 5 years. If your lipid or cholesterol levels are  high, you are over age 50, or you are at high risk for heart disease, you may need your cholesterol levels checked more frequently.Ongoing high lipid and cholesterol levels should be treated with medicines if diet and exercise are not working.  If you smoke, find out from your health care provider how to quit. If you do not use tobacco, do not start.  Lung cancer screening is recommended for adults aged 55-80 years who are at high risk for developing lung cancer because of a history of smoking. A yearly low-dose CT scan of the lungs is recommended for people who have at least a 30-pack-year history of smoking and are current smokers or have quit within the past 15 years. A pack year of smoking is smoking an average of 1 pack of cigarettes a day for 1 year (for example, a 30-pack-year history of smoking could mean smoking 1 pack a day for 30 years or 2 packs a day for 15 years). Yearly screening should continue until the smoker has stopped smoking for at least 15 years. Yearly screening should be stopped for people who develop a health problem that would prevent them from having lung cancer treatment.  If you choose to drink alcohol, do not have more than 2 drinks per day. One drink is considered to be 12 oz (360 mL) of beer, 5 oz (150 mL) of wine, or 1.5 oz (45 mL) of liquor.  Avoid the use of street drugs. Do not share needles with anyone. Ask for help if you need   support or instructions about stopping the use of drugs.  High blood pressure causes heart disease and increases the risk of stroke. High blood pressure is more likely to develop in:  People who have blood pressure in the end of the normal range (100-139/85-89 mm Hg).  People who are overweight or obese.  People who are African American.  If you are 18-39 years of age, have your blood pressure checked every 3-5 years. If you are 40 years of age or older, have your blood pressure checked every year. You should have your blood pressure  measured twice--once when you are at a hospital or clinic, and once when you are not at a hospital or clinic. Record the average of the two measurements. To check your blood pressure when you are not at a hospital or clinic, you can use:  An automated blood pressure machine at a pharmacy.  A home blood pressure monitor.  If you are 45-79 years old, ask your health care provider if you should take aspirin to prevent heart disease.  Diabetes screening involves taking a blood sample to check your fasting blood sugar level. This should be done once every 3 years after age 45 if you are at a normal weight and without risk factors for diabetes. Testing should be considered at a younger age or be carried out more frequently if you are overweight and have at least 1 risk factor for diabetes.  Colorectal cancer can be detected and often prevented. Most routine colorectal cancer screening begins at the age of 50 and continues through age 75. However, your health care provider may recommend screening at an earlier age if you have risk factors for colon cancer. On a yearly basis, your health care provider may provide home test kits to check for hidden blood in the stool. A small camera at the end of a tube may be used to directly examine the colon (sigmoidoscopy or colonoscopy) to detect the earliest forms of colorectal cancer. Talk to your health care provider about this at age 50 when routine screening begins. A direct exam of the colon should be repeated every 5-10 years through age 75, unless early forms of precancerous polyps or small growths are found.  People who are at an increased risk for hepatitis B should be screened for this virus. You are considered at high risk for hepatitis B if:  You were born in a country where hepatitis B occurs often. Talk with your health care provider about which countries are considered high risk.  Your parents were born in a high-risk country and you have not received a  shot to protect against hepatitis B (hepatitis B vaccine).  You have HIV or AIDS.  You use needles to inject street drugs.  You live with, or have sex with, someone who has hepatitis B.  You are a man who has sex with other men (MSM).  You get hemodialysis treatment.  You take certain medicines for conditions like cancer, organ transplantation, and autoimmune conditions.  Hepatitis C blood testing is recommended for all people born from 1945 through 1965 and any individual with known risk factors for hepatitis C.  Healthy men should no longer receive prostate-specific antigen (PSA) blood tests as part of routine cancer screening. Talk to your health care provider about prostate cancer screening.  Testicular cancer screening is not recommended for adolescents or adult males who have no symptoms. Screening includes self-exam, a health care provider exam, and other screening tests. Consult with your   health care provider about any symptoms you have or any concerns you have about testicular cancer.  Practice safe sex. Use condoms and avoid high-risk sexual practices to reduce the spread of sexually transmitted infections (STIs).  You should be screened for STIs, including gonorrhea and chlamydia if:  You are sexually active and are younger than 24 years.  You are older than 24 years, and your health care provider tells you that you are at risk for this type of infection.  Your sexual activity has changed since you were last screened, and you are at an increased risk for chlamydia or gonorrhea. Ask your health care provider if you are at risk.  If you are at risk of being infected with HIV, it is recommended that you take a prescription medicine daily to prevent HIV infection. This is called pre-exposure prophylaxis (PrEP). You are considered at risk if:  You are a man who has sex with other men (MSM).  You are a heterosexual man who is sexually active with multiple partners.  You take  drugs by injection.  You are sexually active with a partner who has HIV.  Talk with your health care provider about whether you are at high risk of being infected with HIV. If you choose to begin PrEP, you should first be tested for HIV. You should then be tested every 3 months for as long as you are taking PrEP.  Use sunscreen. Apply sunscreen liberally and repeatedly throughout the day. You should seek shade when your shadow is shorter than you. Protect yourself by wearing long sleeves, pants, a wide-brimmed hat, and sunglasses year round whenever you are outdoors.  Tell your health care provider of new moles or changes in moles, especially if there is a change in shape or color. Also, tell your health care provider if a mole is larger than the size of a pencil eraser.  A one-time screening for abdominal aortic aneurysm (AAA) and surgical repair of large AAAs by ultrasound is recommended for men aged 65-75 years who are current or former smokers.  Stay current with your vaccines (immunizations).   This information is not intended to replace advice given to you by your health care provider. Make sure you discuss any questions you have with your health care provider.   Document Released: 06/11/2008 Document Revised: 01/04/2015 Document Reviewed: 05/11/2011 Elsevier Interactive Patient Education 2016 [employer]  

## 2016-04-06 NOTE — Assessment & Plan Note (Signed)
1) Anticipatory Guidance: Discussed importance of wearing a seatbelt while driving and not texting while driving; changing batteries in smoke detector at least once annually; wearing suntan lotion when outside; eating a balanced and moderate diet; getting physical activity at least 30 minutes per day.  2) Immunizations / Screenings / Labs:  Pneumovax updated today. All other immunizations are up to date per recommendations. Due for a dental screen encouraged to be completed independently. All other screenings are up to date per recommendations. Obtain CBC, CMET, Lipid profile and TSH.   Overall well exam with risk factors for cardiovascular disease including Type 2 diabetes. Currently managed with medication and changes per Endocrinology. He is of good weight. Recommend diet that is balanced, modified and varied. Increase physical activity to 30 minutes of moderate level activity daily. Continue other healthy lifestyle behaviors and choices. Follow up prevention exam in 1 year; follow up with Endocrinology for diabetes; follow up office visit as needed pending blood work.

## 2016-04-06 NOTE — Progress Notes (Signed)
Subjective:    Patient ID: Joe Boyd, male    DOB: 1976-07-25, 40 y.o.   MRN: [id number]  Chief Complaint  Patient presents with  . CPE    HPI:  Joe Boyd is a 40 y.o. male who presents today for an annual wellness visit.   1) Health Maintenance -   Diet - Averaging about 3 meals per day consisting of vegetables, rice, startches, fruits, and chicken; Caffeine intake of about 1-2 cups per day.   Exercise - Walks occasionally for 30 minutes; indicates needs improvement.    2) Preventative Exams / Immunizations:  Dental -- Due for exam  Vision -- Up to date.    Health Maintenance  Topic Date Due  . PNEUMOCOCCAL POLYSACCHARIDE VACCINE (1) 05/18/1976  . FOOT EXAM  02/23/2016  . URINE MICROALBUMIN  02/23/2016  . HEMOGLOBIN A1C  06/23/2016  . INFLUENZA VACCINE  07/28/2016  . OPHTHALMOLOGY EXAM  03/24/2017  . TETANUS/TDAP  03/17/2025  . HIV Screening  Addressed    Immunization History  Administered Date(s) Administered  . Influenza,inj,Quad PF,36+ Mos 09/12/2015  . Influenza-Unspecified 10/22/2014  . Td 03/18/2015    No Known Allergies   Outpatient Prescriptions Prior to Visit  Medication Sig Dispense Refill  . glucose blood (BAYER CONTOUR TEST) test strip Test once every 3 days. 100 each 5  . Multiple Vitamin (MULTIVITAMIN WITH MINERALS) TABS tablet Take 1 tablet by mouth daily.    . Saxagliptin-Metformin (KOMBIGLYZE XR) 2.04-999 MG TB24 Take 2 tablets daily 60 tablet 3  . oseltamivir (TAMIFLU) 75 MG capsule Take 1 capsule (75 mg total) by mouth 2 (two) times daily. 10 capsule 0  . triamcinolone cream (KENALOG) 0.1 % Apply 1 application topically 2 (two) times daily. 30 g 0   No facility-administered medications prior to visit.     Past Medical History  Diagnosis Date  . Diabetes mellitus without complication (HCC)   . Tuberculosis     Finished Rx in 8/15     No past surgical history on file.   Family History  Problem Relation Age of  Onset  . Diabetes Mother   . Hypertension Mother   . Thyroid disease Mother   . Cancer Neg Hx   . Heart disease Neg Hx   . Diabetes Father   . Hypertension Father      Social History   Social History  . Marital Status: Married    Spouse Name: N/A  . Number of Children: 1  . Years of Education: 16   Occupational History  . Education administrator     Social History Main Topics  . Smoking status: Never Smoker   . Smokeless tobacco: Never Used  . Alcohol Use: Yes     Comment: occasionally  . Drug Use: No  . Sexual Activity: Not on file   Other Topics Concern  . Not on file   Social History Narrative   Born and raised in [place] until the age of 26. Currently lives with his wife and child. No pets. Fun: work, Engineer, petroleum out with friends, bowling.   Denies any religious beliefs that would effect health care.     Review of Systems  Constitutional: Denies fever, chills, fatigue, or significant weight gain/loss. HENT: Head: Denies headache or neck pain Ears: Denies changes in hearing, ringing in ears, earache, drainage Nose: Denies discharge, stuffiness, itching, nosebleed, sinus pain Throat: Denies sore throat, hoarseness, dry mouth, sores, thrush Eyes: Denies loss/changes in vision, pain, redness, blurry/double vision, flashing lights  Cardiovascular: Denies chest pain/discomfort, tightness, palpitations, shortness of breath with activity, difficulty lying down, swelling, sudden awakening with shortness of breath Respiratory: Denies shortness of breath, cough, sputum production, wheezing Gastrointestinal: Denies dysphasia, heartburn, change in appetite, nausea, change in bowel habits, rectal bleeding, constipation, diarrhea, yellow skin or eyes Genitourinary: Denies frequency, urgency, burning/pain, blood in urine, incontinence, change in urinary strength. Musculoskeletal: Denies muscle/joint pain, stiffness, back pain, redness or swelling of joints, trauma Skin: Denies rashes, lumps,  itching, dryness, color changes, or hair/nail changes Neurological: Denies dizziness, fainting, seizures, weakness, numbness, tingling, tremor Psychiatric - Denies nervousness, stress, depression or memory loss Endocrine: Denies heat or cold intolerance, sweating, frequent urination, excessive thirst, changes in appetite Hematologic: Denies ease of bruising or bleeding     Objective:    BP 112/88 mmHg  Pulse 63  Temp(Src) 97.7 F (36.5 C) (Oral)  Resp 16  Ht 5\' 8"  (1.727 m)  Wt 148 lb (67.132 kg)  BMI 22.51 kg/m2  SpO2 98% Nursing note and vital signs reviewed.  Physical Exam  Constitutional: He is oriented to person, place, and time. He appears well-developed and well-nourished.  HENT:  Head: Normocephalic.  Right Ear: Hearing, tympanic membrane, external ear and ear canal normal.  Left Ear: Hearing, tympanic membrane, external ear and ear canal normal.  Nose: Nose normal.  Mouth/Throat: Uvula is midline, oropharynx is clear and moist and mucous membranes are normal.  Eyes: Conjunctivae and EOM are normal. Pupils are equal, round, and reactive to light.  Neck: Neck supple. No JVD present. No tracheal deviation present. No thyromegaly present.  Cardiovascular: Normal rate, regular rhythm, normal heart sounds and intact distal pulses.   Pulmonary/Chest: Effort normal and breath sounds normal.  Abdominal: Soft. Bowel sounds are normal. He exhibits no distension and no mass. There is no tenderness. There is no rebound and no guarding.  Musculoskeletal: Normal range of motion. He exhibits no edema or tenderness.  Lymphadenopathy:    He has no cervical adenopathy.  Neurological: He is alert and oriented to person, place, and time. He has normal reflexes. No cranial nerve deficit. He exhibits normal muscle tone. Coordination normal.  Skin: Skin is warm and dry.  Psychiatric: He has a normal mood and affect. His behavior is normal. Judgment and thought content normal.         Assessment & Plan:   Problem List Items Addressed This Visit      Other   Routine general medical examination at a health care facility - Primary    1) Anticipatory Guidance: Discussed importance of wearing a seatbelt while driving and not texting while driving; changing batteries in smoke detector at least once annually; wearing suntan lotion when outside; eating a balanced and moderate diet; getting physical activity at least 30 minutes per day.  2) Immunizations / Screenings / Labs:  Pneumovax updated today. All other immunizations are up to date per recommendations. Due for a dental screen encouraged to be completed independently. All other screenings are up to date per recommendations. Obtain CBC, CMET, Lipid profile and TSH.   Overall well exam with risk factors for cardiovascular disease including Type 2 diabetes. Currently managed with medication and changes per Endocrinology. He is of good weight. Recommend diet that is balanced, modified and varied. Increase physical activity to 30 minutes of moderate level activity daily. Continue other healthy lifestyle behaviors and choices. Follow up prevention exam in 1 year; follow up with Endocrinology for diabetes; follow up office visit as needed pending blood work.  Relevant Orders   CBC   Comprehensive metabolic panel   Lipid panel   TSH

## 2016-04-06 NOTE — Progress Notes (Signed)
Pre visit review using our clinic review tool, if applicable. No additional management support is needed unless otherwise documented below in the visit note. 

## 2016-04-07 ENCOUNTER — Encounter: Payer: Self-pay | Admitting: Family

## 2016-04-07 MED ORDER — FUROSEMIDE 20 MG PO TABS: 20.0000 mg | ORAL_TABLET | Freq: Once | ORAL | Status: DC

## 2016-04-21 ENCOUNTER — Other Ambulatory Visit: Payer: Self-pay

## 2016-04-24 ENCOUNTER — Ambulatory Visit: Payer: Self-pay | Admitting: Endocrinology

## 2016-05-26 ENCOUNTER — Other Ambulatory Visit (INDEPENDENT_AMBULATORY_CARE_PROVIDER_SITE_OTHER): Payer: BLUE CROSS/BLUE SHIELD

## 2016-05-26 DIAGNOSIS — E119 Type 2 diabetes mellitus without complications: Secondary | ICD-10-CM

## 2016-05-26 DIAGNOSIS — E049 Nontoxic goiter, unspecified: Secondary | ICD-10-CM | POA: Diagnosis not present

## 2016-05-26 DIAGNOSIS — R748 Abnormal levels of other serum enzymes: Secondary | ICD-10-CM

## 2016-05-26 LAB — BASIC METABOLIC PANEL
BUN: 10 mg/dL (ref 6–23)
CALCIUM: 10.6 mg/dL — AB (ref 8.4–10.5)
CO2: 32 mEq/L (ref 19–32)
CREATININE: 0.95 mg/dL (ref 0.40–1.50)
Chloride: 103 mEq/L (ref 96–112)
GFR: 92.4 mL/min (ref >60.00)
GLUCOSE: 107 mg/dL — AB (ref 70–99)
POTASSIUM: 5.1 meq/L (ref 3.5–5.1)
SODIUM: 141 meq/L (ref 135–145)

## 2016-05-26 LAB — TSH: TSH: 1.18 u[IU]/mL (ref 0.35–4.50)

## 2016-05-26 LAB — LIPID PANEL
CHOL/HDL RATIO: 4
Cholesterol: 146 mg/dL (ref 0–200)
HDL: 38.3 mg/dL — AB (ref >39.00)
LDL CALC: 70 mg/dL (ref 0–99)
NonHDL: 108.15
TRIGLYCERIDES: 192 mg/dL — AB (ref 0.0–149.0)
VLDL: 38.4 mg/dL (ref 0.0–40.0)

## 2016-05-26 LAB — MICROALBUMIN / CREATININE URINE RATIO
Creatinine,U: 122.7 mg/dL
Microalb Creat Ratio: 0.7 mg/g (ref 0.0–30.0)
Microalb, Ur: 0.9 mg/dL (ref 0.0–1.9)

## 2016-05-26 LAB — HEMOGLOBIN A1C: Hgb A1c MFr Bld: 7.6 % — ABNORMAL HIGH (ref 4.6–6.5)

## 2016-05-28 ENCOUNTER — Encounter: Payer: Self-pay | Admitting: Endocrinology

## 2016-05-28 ENCOUNTER — Ambulatory Visit (INDEPENDENT_AMBULATORY_CARE_PROVIDER_SITE_OTHER): Payer: BLUE CROSS/BLUE SHIELD | Admitting: Endocrinology

## 2016-05-28 VITALS — BP 122/82 | HR 77 | Temp 98.0°F | Resp 14 | Ht 68.0 in | Wt 151.2 lb

## 2016-05-28 DIAGNOSIS — E1165 Type 2 diabetes mellitus with hyperglycemia: Secondary | ICD-10-CM

## 2016-05-28 MED ORDER — MIGLITOL 25 MG PO TABS: 25.0000 mg | ORAL_TABLET | Freq: Three times a day (TID) | ORAL | Status: DC

## 2016-05-28 NOTE — Patient Instructions (Signed)
Walk daily  Check blood sugars on waking up 2-3  times a week  Also check blood sugars about 2 hours after a meal and do this after different meals by rotation  Recommended blood sugar levels on waking up is 90-130 and about 2 hours after meal is 130-160  Please bring your blood sugar monitor to each visit, thank you  [place] just before lunch

## 2016-05-28 NOTE — Progress Notes (Signed)
Patient ID: Joe Boyd, male   DOB: 01/22/1976, 40 y.o.   MRN: [id number]           Reason for Appointment: Follow-up visit for Type 2 Diabetes  Referring physician: none  History of Present Illness:          Diagnosis: Type 2 diabetes mellitus, date of diagnosis: 2012        Past history:  At time of diagnosis he was having symptoms of increased thirst and urination and he was seen by his physician when he started feeling weak and dizzy also. His A1c at diagnosis was 12.8. He thinks he was treated with metformin only and also he started changing his diet along with eliminating sweet soft drinks.  Initially was treated with only 500 mg of metformin ER and in 2014 this was increased to 1000 mg.  He has not had any other medications for diabetes. He was [place] seen [place]ocrin[place] in 07/2014 when his A1c was 6.5 and no changes were made  Recent history:   With higher blood sugars and A1c of over 7% he was started on Kombiglyze XR in 7/16   Current management, blood sugar patterns and problems identified:   his A1c has been higher now at 7.6 , only once below 7   he thinks his blood sugars are higher because of having  House guests preventing him from following his diet    not active, not being able to exercise or walk    he lost his meter over a month ago and not clear what his blood sugars are.  Again previously would be checking blood sugars mostly fasting   lab glucose was 109 fasting   has gained a little weight       Oral hypoglycemic drugs the patient is taking are:   Kombiglyze XR 2.04/999, 2 tablets daily     Side effects from medications have been: None Compliance with the medical regimen: Fair   Glucose monitoring:  done occasionally        Glucometer:  Contour     Blood Glucose readings:  none  Self-care:  Meals: 3 meals per day. Breakfast is usually a small sandwich with chicken Usually has a sandwich or roti and vegetables for lunch and  again roti and vegetables for dinner.   Usually does not eat much yogurt, sometimes has chicken.    Usually avoiding snacks          Exercise:  None, previously walking 35 minutes         Dietician visit, most recent: 7/16                Weight history:   Wt Readings from Last 3 Encounters:  05/28/16 151 lb 3.2 oz (68.584 kg)  04/06/16 148 lb (67.132 kg)  03/25/16 152 lb (68.947 kg)    Glycemic control:  A1c in 07/2014 was 6.5  Lab Results  Component Value Date   HGBA1C 7.6* 05/26/2016   HGBA1C 6.9* 12/24/2015   HGBA1C 7.3* 07/08/2015   Lab Results  Component Value Date   MICROALBUR 0.9 05/26/2016   LDLCALC 70 05/26/2016   CREATININE 0.95 05/26/2016        Medication List       This list is accurate as of: 05/28/16  9:06 PM.  Always use your most recent med list.               furosemide 20 MG tablet  Commonly  known as:  LASIX  Take 1 tablet (20 mg total) by mouth once.     glucose blood test strip  Commonly known as:  BAYER CONTOUR TEST  Test once every 3 days.     miglitol 25 MG tablet  Commonly known as:  GLYSET  Take 1 tablet (25 mg total) by mouth 3 (three) times daily with meals.     multivitamin with minerals Tabs tablet  Take 1 tablet by mouth daily.     Saxagliptin-Metformin 2.04-999 MG Tb24  Commonly known as:  KOMBIGLYZE XR  Take 2 tablets daily        Allergies: No Known Allergies  Past Medical History  Diagnosis Date  . Diabetes mellitus without complication (HCC)   . Tuberculosis     Finished Rx in 8/15    No past surgical history on file.  Family History  Problem Relation Age of Onset  . Diabetes Mother   . Hypertension Mother   . Thyroid disease Mother   . Cancer Neg Hx   . Heart disease Neg Hx   . Diabetes Father   . Hypertension Father     Social History:  reports that he has never smoked. He has never used smokeless tobacco. He reports that he drinks alcohol. He reports that he does not use illicit drugs.      Review of Systems         Lipids:  LDL and triglyceride normal HDL relatively low      Lab Results  Component Value Date   CHOL 146 05/26/2016   HDL 38.30* 05/26/2016   LDLCALC 70 05/26/2016   TRIG 192.0* 05/26/2016   CHOLHDL 4 05/26/2016                  Thyroid: Has a small goiter commonly euthyroid  Lab Results  Component Value Date   TSH 1.18 05/26/2016      LABS:  Lab on 05/26/2016  Component Date Value Ref Range Status  . Hgb A1c MFr Bld 05/26/2016 7.6* 4.6 - 6.5 % Final   Glycemic Control Guidelines for People with Diabetes:Non Diabetic:  <6%Goal of Therapy: <7%Additional Action Suggested:  >8%   . Sodium 05/26/2016 141  135 - 145 mEq/L Final  . Potassium 05/26/2016 5.1  3.5 - 5.1 mEq/L Final  . Chloride 05/26/2016 103  96 - 112 mEq/L Final  . CO2 05/26/2016 32  19 - 32 mEq/L Final  . Glucose, Bld 05/26/2016 107* 70 - 99 mg/dL Final  . BUN 16/09/9603 10  6 - 23 mg/dL Final  . Creatinine, Ser 05/26/2016 0.95  0.40 - 1.50 mg/dL Final  . Calcium 54/08/8118 10.6* 8.4 - 10.5 mg/dL Final  . GFR 14/78/2956 92.40  >60.00 mL/min Final  . Cholesterol 05/26/2016 146  0 - 200 mg/dL Final   ATP III Classification       Desirable:  < 200 mg/dL               Borderline High:  200 - 239 mg/dL          High:  > = 213 mg/dL  . Triglycerides 05/26/2016 192.0* 0.0 - 149.0 mg/dL Final   Normal:  <086 mg/dLBorderline High:  150 - 199 mg/dL  . HDL 05/26/2016 38.30* >39.00 mg/dL Final  . VLDL 57/84/6962 38.4  0.0 - 40.0 mg/dL Final  . LDL Cholesterol 05/26/2016 70  0 - 99 mg/dL Final  . Total CHOL/HDL Ratio 05/26/2016 4   Final  Men          Women1/2 Average Risk     3.4          3.3Average Risk          5.0          4.42X Average Risk          9.6          7.13X Average Risk          15.0          11.0                      . NonHDL 05/26/2016 108.15   Final   NOTE:  Non-HDL goal should be 30 mg/dL higher than patient's LDL goal (i.e. LDL goal of < 70 mg/dL, would  have non-HDL goal of < 100 mg/dL)  . TSH 05/26/2016 1.18  0.35 - 4.50 uIU/mL Final  . Microalb, Ur 05/26/2016 0.9  0.0 - 1.9 mg/dL Final  . Creatinine,U 16/09/9603 122.7   Final  . Microalb Creat Ratio 05/26/2016 0.7  0.0 - 30.0 mg/g Final    Physical Examination:  BP 122/82 mmHg  Pulse 77  Temp(Src) 98 F (36.7 C)  Resp 14  Ht 5\' 8"  (1.727 m)  Wt 151 lb 3.2 oz (68.584 kg)  BMI 23.00 kg/m2  SpO2 99%     ASSESSMENT:  Diabetes type 2 , nonobese with worsening control and A1c of 7.6 Since his fasting glucose is 107 he likely has post prandial hyperglycemia. He has not checked his blood sugar and over a month and not have a functioning meter Recently has not been doing any exercise and also has not been consistent with his diet but types of foods and sweets  Discussed with the patient that his A1c ideally should be under 6.5 considering his age Will need another agent to help his diabetes control especially post prandial hyperglycemia He does need to get back into his diet and exercise regimen   PLAN:   Given new glucose monitor  Started Glyset 25 mg before lunch which is his main meal, discussed possible side effects of bloating and gaseousness.  He may start with half tablet for the first few days  Check blood sugar at least every other day after various meals, discussed blood sugar targets, to call if he has consistently high glucose  No change in Kombiglyze XR  Balanced meals with low amount of carbohydrates, more protein   Restart daily walking   [facility] 05/28/2016, 9:06 PM   Note: This office note was prepared with Insurance underwriter. Any transcriptional errors that result from this process are unintentional.

## 2016-06-09 ENCOUNTER — Telehealth: Payer: Self-pay | Admitting: Endocrinology

## 2016-06-09 NOTE — Telephone Encounter (Signed)
Patient ask if Dr Lucianne MussKumar can let him know where was the new medication that was prescribed to him pharmacy it was sent to. Patient didn't know the name of the medication

## 2016-06-09 NOTE — Telephone Encounter (Signed)
Pt informed of miglotol rx sent to [facility]. Pt also wanted to update pof. Pt is NO longer using the [facility]. This has been updated on both the snap shot and meds/orders.  [facility]. If he has any trouble, he will call us and let us know.   Forwarding to Endo MD as an [place]

## 2016-08-28 ENCOUNTER — Other Ambulatory Visit (INDEPENDENT_AMBULATORY_CARE_PROVIDER_SITE_OTHER): Payer: BLUE CROSS/BLUE SHIELD

## 2016-08-28 DIAGNOSIS — E1165 Type 2 diabetes mellitus with hyperglycemia: Secondary | ICD-10-CM

## 2016-08-28 LAB — BASIC METABOLIC PANEL
BUN: 12 mg/dL (ref 6–23)
CO2: 28 mEq/L (ref 19–32)
CREATININE: 0.97 mg/dL (ref 0.40–1.50)
Calcium: 9.4 mg/dL (ref 8.4–10.5)
Chloride: 107 mEq/L (ref 96–112)
GFR: 90.09 mL/min (ref >60.00)
GLUCOSE: 121 mg/dL — AB (ref 70–99)
POTASSIUM: 4.3 meq/L (ref 3.5–5.1)
Sodium: 141 mEq/L (ref 135–145)

## 2016-08-28 LAB — HEMOGLOBIN A1C: Hgb A1c MFr Bld: 7.2 % — ABNORMAL HIGH (ref 4.6–6.5)

## 2016-09-02 ENCOUNTER — Encounter: Payer: Self-pay | Admitting: Endocrinology

## 2016-09-02 ENCOUNTER — Ambulatory Visit (INDEPENDENT_AMBULATORY_CARE_PROVIDER_SITE_OTHER): Payer: BLUE CROSS/BLUE SHIELD | Admitting: Endocrinology

## 2016-09-02 VITALS — BP 124/78 | HR 71 | Temp 98.2°F | Resp 14 | Ht 68.0 in | Wt 149.4 lb

## 2016-09-02 DIAGNOSIS — Z23 Encounter for immunization: Secondary | ICD-10-CM | POA: Diagnosis not present

## 2016-09-02 DIAGNOSIS — E1165 Type 2 diabetes mellitus with hyperglycemia: Secondary | ICD-10-CM

## 2016-09-02 NOTE — Patient Instructions (Addendum)
  Check blood sugars on waking up 2x per week   Also check blood sugars about 2 hours after a meal and do this after different meals by rotation esp lunch  Recommended blood sugar levels on waking up is 90-130 and about 2 hours after meal is 130-160  Please bring your blood sugar monitor to each visit, thank you  Protein at each meal

## 2016-09-02 NOTE — Progress Notes (Signed)
Patient ID: Joe Boyd, male   DOB: 20-Oct-1976, 40 y.o.   MRN: [id number]           Reason for Appointment: Follow-up visit for Type 2 Diabetes  Referring physician: none  History of Present Illness:          Diagnosis: Type 2 diabetes mellitus, date of diagnosis: 2012        Past history:  At time of diagnosis he was having symptoms of increased thirst and urination and he was seen by his physician when he started feeling weak and dizzy also. His A1c at diagnosis was 12.8. He thinks he was treated with metformin only and also he started changing his diet along with eliminating sweet soft drinks.  Initially was treated with only 500 mg of metformin ER and in 2014 this was increased to 1000 mg.  He has not had any other medications for diabetes. He was last seen by [place]ocrin[place] in 07/2014 when his A1c was 6.5 and no changes were made  Recent history:   With higher blood sugars and A1c of over 7% he was started on Kombiglyze XR in 7/16 Subsequently in 6/17 with his A1c going up to 7.6 he was given Glyset 25 mg at lunch also  A1c is slightly better at 7.2   Current management, blood sugar patterns and problems identified:   He has not brought his monitor again for download  Although his main meal is probably at lunchtime he is not checking postprandial readings  However he has tolerated Glyset at lunchtime and taking this regularly  He thinks blood sugars are not significantly high fasting or after evening meal, lab glucose was 121 fasting  He will not consistently have protein with meals, sometimes eating chicken  Has not found the time for exercise recently       Oral hypoglycemic drugs the patient is taking are:   Kombiglyze XR 2.04/999, 2 tablets daily     Side effects from medications have been: None Compliance with the medical regimen: Fair   Glucose monitoring:  done occasionally        Glucometer:  Contour     Blood Glucose readings:    FBS  120-130 PCL ? PCS 140-150  Self-care:  Meals: 3 meals per day. Breakfast is usually a small sandwich with chicken Usually has a sandwich or roti and vegetables for lunch and again roti and vegetables for dinner.   Usually does not eat much yogurt, sometimes has chicken.    Usually avoiding snacks          Exercise:  None, previously walking 35 minutes         Dietician visit, most recent: 7/16                Weight history:   Wt Readings from Last 3 Encounters:  09/02/16 149 lb 6.4 oz (67.8 kg)  05/28/16 151 lb 3.2 oz (68.6 kg)  04/06/16 148 lb (67.1 kg)    Glycemic control:  A1c in 07/2014 was 6.5  Lab Results  Component Value Date   HGBA1C 7.2 (H) 08/28/2016   HGBA1C 7.6 (H) 05/26/2016   HGBA1C 6.9 (H) 12/24/2015   Lab Results  Component Value Date   MICROALBUR 0.9 05/26/2016   LDLCALC 70 05/26/2016   CREATININE 0.97 08/28/2016   Lab on 08/28/2016  Component Date Value Ref Range Status  . Hgb A1c MFr Bld 08/28/2016 7.2* 4.6 - 6.5 % Final  . Sodium  08/28/2016 141  135 - 145 mEq/L Final  . Potassium 08/28/2016 4.3  3.5 - 5.1 mEq/L Final  . Chloride 08/28/2016 107  96 - 112 mEq/L Final  . CO2 08/28/2016 28  19 - 32 mEq/L Final  . Glucose, Bld 08/28/2016 121* 70 - 99 mg/dL Final  . BUN 16/09/9603 12  6 - 23 mg/dL Final  . Creatinine, Ser 08/28/2016 0.97  0.40 - 1.50 mg/dL Final  . Calcium 54/08/8118 9.4  8.4 - 10.5 mg/dL Final  . GFR 14/78/2956 90.09  >60.00 mL/min Final        Medication List       Accurate as of 09/02/16  3:23 PM. Always use your most recent med list.          furosemide 20 MG tablet Commonly known as:  LASIX Take 1 tablet (20 mg total) by mouth once.   glucose blood test strip Commonly known as:  BAYER CONTOUR TEST Test once every 3 days.   miglitol 25 MG tablet Commonly known as:  GLYSET Take 1 tablet (25 mg total) by mouth 3 (three) times daily with meals.   multivitamin with minerals Tabs tablet Take 1 tablet by mouth  daily.   Saxagliptin-Metformin 2.04-999 MG Tb24 Commonly known as:  KOMBIGLYZE XR Take 2 tablets daily       Allergies: No Known Allergies  Past Medical History:  Diagnosis Date  . Diabetes mellitus without complication (HCC)   . Tuberculosis    Finished Rx in 8/15    No past surgical history on file.  Family History  Problem Relation Age of Onset  . Diabetes Mother   . Hypertension Mother   . Thyroid disease Mother   . Cancer Neg Hx   . Heart disease Neg Hx   . Diabetes Father   . Hypertension Father     Social History:  reports that he has never smoked. He has never used smokeless tobacco. He reports that he drinks alcohol. He reports that he does not use drugs.    Review of Systems         Lipids:  LDL and triglycerides normal HDL relatively low      Lab Results  Component Value Date   CHOL 146 05/26/2016   HDL 38.30 (L) 05/26/2016   LDLCALC 70 05/26/2016   TRIG 192.0 (H) 05/26/2016   CHOLHDL 4 05/26/2016                  Thyroid: Has a small goiter, euthyroid  Lab Results  Component Value Date   TSH 1.18 05/26/2016    He is asking about white spots on his upper arms  LABS:  Lab on 08/28/2016  Component Date Value Ref Range Status  . Hgb A1c MFr Bld 08/28/2016 7.2* 4.6 - 6.5 % Final  . Sodium 08/28/2016 141  135 - 145 mEq/L Final  . Potassium 08/28/2016 4.3  3.5 - 5.1 mEq/L Final  . Chloride 08/28/2016 107  96 - 112 mEq/L Final  . CO2 08/28/2016 28  19 - 32 mEq/L Final  . Glucose, Bld 08/28/2016 121* 70 - 99 mg/dL Final  . BUN 21/30/8657 12  6 - 23 mg/dL Final  . Creatinine, Ser 08/28/2016 0.97  0.40 - 1.50 mg/dL Final  . Calcium 84/69/6295 9.4  8.4 - 10.5 mg/dL Final  . GFR 28/41/3244 90.09  >60.00 mL/min Final    Physical Examination:  BP 124/78   Pulse 71   Temp 98.2 F (36.8 C)  Resp 14   Ht 5\' 8"  (1.727 m)   Wt 149 lb 6.4 oz (67.8 kg)   SpO2 99%   BMI 22.72 kg/m   Scattered macular depigmented lesions about 1-1.5 cm on  upper arms without skin changes    ASSESSMENT:  Diabetes type 2 , nonobese See history of present illness for detailed discussion of current diabetes management, blood sugar patterns and problems identified His A1c is still over 7% However he does improving He does not report high readings but not checking blood sugars after lunch which usually is his main meal He has not exercised Also can do  better with consistent protein at all meals   PLAN:   Follow-up in 3 months  Check blood sugar at least every other day after various meals especially lunch, discussed blood sugar targets, to call if he has consistently high glucose and may need a higher dose of Glyset at lunch  No change in Kombiglyze XR or Glyset  Balanced meals with low amount of carbohydrates, more protein   Restart daily walking   [facility] 09/02/2016, 3:23 PM   Note: This office note was prepared with Dragon voice recognition system technology. Any transcriptional errors that result from this process are unintentional.

## 2016-09-10 DIAGNOSIS — K921 Melena: Secondary | ICD-10-CM | POA: Diagnosis not present

## 2016-09-10 DIAGNOSIS — R1110 Vomiting, unspecified: Secondary | ICD-10-CM | POA: Diagnosis not present

## 2016-09-10 DIAGNOSIS — K2970 Gastritis, unspecified, without bleeding: Secondary | ICD-10-CM | POA: Diagnosis not present

## 2016-09-10 DIAGNOSIS — D649 Anemia, unspecified: Secondary | ICD-10-CM | POA: Diagnosis not present

## 2016-09-26 ENCOUNTER — Other Ambulatory Visit: Payer: Self-pay | Admitting: Endocrinology

## 2016-12-04 ENCOUNTER — Other Ambulatory Visit: Payer: BLUE CROSS/BLUE SHIELD

## 2016-12-07 ENCOUNTER — Other Ambulatory Visit (INDEPENDENT_AMBULATORY_CARE_PROVIDER_SITE_OTHER): Payer: BLUE CROSS/BLUE SHIELD

## 2016-12-07 DIAGNOSIS — E1165 Type 2 diabetes mellitus with hyperglycemia: Secondary | ICD-10-CM

## 2016-12-07 LAB — COMPREHENSIVE METABOLIC PANEL
ALBUMIN: 4.5 g/dL (ref 3.5–5.2)
ALT: 39 U/L (ref 0–53)
AST: 21 U/L (ref 0–37)
Alkaline Phosphatase: 69 U/L (ref 39–117)
BILIRUBIN TOTAL: 0.6 mg/dL (ref 0.2–1.2)
BUN: 11 mg/dL (ref 6–23)
CALCIUM: 9.6 mg/dL (ref 8.4–10.5)
CO2: 31 mEq/L (ref 19–32)
CREATININE: 1 mg/dL (ref 0.40–1.50)
Chloride: 102 mEq/L (ref 96–112)
GFR: 86.86 mL/min (ref >60.00)
Glucose, Bld: 128 mg/dL — ABNORMAL HIGH (ref 70–99)
Potassium: 4.6 mEq/L (ref 3.5–5.1)
Sodium: 140 mEq/L (ref 135–145)
Total Protein: 7.9 g/dL (ref 6.0–8.3)

## 2016-12-07 LAB — HEMOGLOBIN A1C: Hgb A1c MFr Bld: 7.4 % — ABNORMAL HIGH (ref 4.6–6.5)

## 2016-12-15 ENCOUNTER — Ambulatory Visit (INDEPENDENT_AMBULATORY_CARE_PROVIDER_SITE_OTHER): Payer: BLUE CROSS/BLUE SHIELD | Admitting: Endocrinology

## 2016-12-15 ENCOUNTER — Encounter: Payer: Self-pay | Admitting: Endocrinology

## 2016-12-15 VITALS — BP 128/86 | HR 66 | Ht 68.0 in | Wt 150.0 lb

## 2016-12-15 DIAGNOSIS — E1165 Type 2 diabetes mellitus with hyperglycemia: Secondary | ICD-10-CM | POA: Diagnosis not present

## 2016-12-15 NOTE — Patient Instructions (Addendum)
Check blood sugars on waking up    Also check blood sugars about 2 hours after a meal and do this after different meals by rotation  Recommended blood sugar levels on waking up is 90-130 and about 2 hours after meal is 130-160  Please bring your blood sugar monitor to each visit, thank you  May take Miglitol just before meal  Restart exercise  Protein at all meals

## 2016-12-15 NOTE — Progress Notes (Signed)
Patient ID: Joe Boyd, male   DOB: [birth year], 40 y.o.   MRN: [id number]           Reason for Appointment: Follow-up visit for Type 2 Diabetes  Referring physician: none  History of Present Illness:          Diagnosis: Type 2 diabetes mellitus, date of diagnosis: 2012        Past history:  At time of diagnosis he was having symptoms of increased thirst and urination and he was seen by his physician when he started feeling weak and dizzy also. His A1c at diagnosis was 12.8. He thinks he was treated with metformin only and also he started changing his diet along with eliminating sweet soft drinks.  Initially was treated with only 500 mg of metformin ER and in 2014 this was increased to 1000 mg.  He has not had any other medications for diabetes. He was last seen by [place]ocrin[place] in 07/2014 when his A1c was 6.5 and no changes were made  Recent history:   With higher blood sugars and A1c of over 7% he was started on Kombiglyze XR in 7/16 Subsequently in 6/17 with his A1c going up to 7.6 he was given Glyset 25 mg at lunch also  A1c is slightly at 7.4 and appears to be gradually increasing   Current management, blood sugar patterns and problems identified:   He has not checked his blood sugar except today after lunch  He has been traveling and has not been watching his diet including eating more sweets  However he has tried to do some walking  Although his main meal is at lunchtime not clear if he is getting higher readings consistently, today his glucose was 147  However he has taken his Glyset after eating rather than before as directed  Lab glucose was 128 fasting  He will not consistently have protein with meals, sometimes eating chicken and very little yogurt or beans  Has not found the time for exercise recently       Oral hypoglycemic drugs the patient is taking are:   Kombiglyze XR 2.04/999, 2 tablets daily     Side effects from medications have  been: None Compliance with the medical regimen: Fair   Glucose monitoring:  done occasionally        Glucometer:  Contour     Blood Glucose readings:    Self-care:  Meals: 3 meals per day. Breakfast is usually a small sandwich with chicken Usually has a sandwich or roti and vegetables for lunch and again roti and vegetables for dinner.   Usually does not eat much yogurt, sometimes has chicken.    Usually avoiding snacks          Exercise:   walking 35 minutes/tennis         Dietician visit, most recent: 7/16                Weight history:   Wt Readings from Last 3 Encounters:  12/15/16 150 lb (68 kg)  09/02/16 149 lb 6.4 oz (67.8 kg)  05/28/16 151 lb 3.2 oz (68.6 kg)    Glycemic control:  A1c in 07/2014 was 6.5  Lab Results  Component Value Date   HGBA1C 7.4 (H) 12/07/2016   HGBA1C 7.2 (H) 08/28/2016   HGBA1C 7.6 (H) 05/26/2016   Lab Results  Component Value Date   MICROALBUR 0.9 05/26/2016   LDLCALC 70 05/26/2016   CREATININE 1.00 12/07/2016  No visits with results within 1 Week(s) from this visit.  Latest known visit with results is:  Lab on 12/07/2016  Component Date Value Ref Range Status  . Hgb A1c MFr Bld 12/07/2016 7.4* 4.6 - 6.5 % Final  . Sodium 12/07/2016 140  135 - 145 mEq/L Final  . Potassium 12/07/2016 4.6  3.5 - 5.1 mEq/L Final  . Chloride 12/07/2016 102  96 - 112 mEq/L Final  . CO2 12/07/2016 31  19 - 32 mEq/L Final  . Glucose, Bld 12/07/2016 128* 70 - 99 mg/dL Final  . BUN 30/86/5784 11  6 - 23 mg/dL Final  . Creatinine, Ser 12/07/2016 1.00  0.40 - 1.50 mg/dL Final  . Total Bilirubin 12/07/2016 0.6  0.2 - 1.2 mg/dL Final  . Alkaline Phosphatase 12/07/2016 69  39 - 117 U/L Final  . AST 12/07/2016 21  0 - 37 U/L Final  . ALT 12/07/2016 39  0 - 53 U/L Final  . Total Protein 12/07/2016 7.9  6.0 - 8.3 g/dL Final  . Albumin 69/62/9528 4.5  3.5 - 5.2 g/dL Final  . Calcium 41/32/4401 9.6  8.4 - 10.5 mg/dL Final  . GFR 02/72/5366 86.86  >60.00 mL/min  Final      Allergies as of 12/15/2016   No Known Allergies     Medication List       Accurate as of 12/15/16  3:32 PM. Always use your most recent med list.          glucose blood test strip Commonly known as:  BAYER CONTOUR TEST Test once every 3 days.   KOMBIGLYZE XR 2.04-999 MG Tb24 Generic drug:  Saxagliptin-Metformin TAKE 2 TABLETS BY MOUTH DAILY   miglitol 25 MG tablet Commonly known as:  GLYSET Take 1 tablet (25 mg total) by mouth 3 (three) times daily with meals.   multivitamin with minerals Tabs tablet Take 1 tablet by mouth daily.       Allergies: No Known Allergies  Past Medical History:  Diagnosis Date  . Diabetes mellitus without complication (HCC)   . Tuberculosis    Finished Rx in 8/15    No past surgical history on file.  Family History  Problem Relation Age of Onset  . Diabetes Mother   . Hypertension Mother   . Thyroid disease Mother   . Diabetes Father   . Hypertension Father   . Cancer Neg Hx   . Heart disease Neg Hx     Social History:  reports that he has never smoked. He has never used smokeless tobacco. He reports that he drinks alcohol. He reports that he does not use drugs.    Review of Systems         Lipids:  LDL and triglycerides normal HDL relatively low      Lab Results  Component Value Date   CHOL 146 05/26/2016   HDL 38.30 (L) 05/26/2016   LDLCALC 70 05/26/2016   TRIG 192.0 (H) 05/26/2016   CHOLHDL 4 05/26/2016                  Thyroid: Has a small goiter, euthyroid  Lab Results  Component Value Date   TSH 1.18 05/26/2016     LABS:  No visits with results within 1 Week(s) from this visit.  Latest known visit with results is:  Lab on 12/07/2016  Component Date Value Ref Range Status  . Hgb A1c MFr Bld 12/07/2016 7.4* 4.6 - 6.5 % Final  . Sodium 12/07/2016  140  135 - 145 mEq/L Final  . Potassium 12/07/2016 4.6  3.5 - 5.1 mEq/L Final  . Chloride 12/07/2016 102  96 - 112 mEq/L Final  . CO2  12/07/2016 31  19 - 32 mEq/L Final  . Glucose, Bld 12/07/2016 128* 70 - 99 mg/dL Final  . BUN 16/10/960412/10/2016 11  6 - 23 mg/dL Final  . Creatinine, Ser 12/07/2016 1.00  0.40 - 1.50 mg/dL Final  . Total Bilirubin 12/07/2016 0.6  0.2 - 1.2 mg/dL Final  . Alkaline Phosphatase 12/07/2016 69  39 - 117 U/L Final  . AST 12/07/2016 21  0 - 37 U/L Final  . ALT 12/07/2016 39  0 - 53 U/L Final  . Total Protein 12/07/2016 7.9  6.0 - 8.3 g/dL Final  . Albumin 54/09/811912/10/2016 4.5  3.5 - 5.2 g/dL Final  . Calcium 14/78/295612/10/2016 9.6  8.4 - 10.5 mg/dL Final  . GFR 21/30/865712/10/2016 86.86  >60.00 mL/min Final    Physical Examination:  BP 128/86   Pulse 66   Ht 5\' 8"  (1.727 m)   Wt 150 lb (68 kg)   SpO2 96%   BMI 22.81 kg/m      ASSESSMENT:  Diabetes type 2 , nonobese See history of present illness for detailed discussion of current diabetes management, blood sugar patterns and problems identified His A1c is still over 7% and relatively higher now  He is again noncompliant with checking his blood sugars after meals and most likely this is where he has high readings, fasting reading was 128 in the lab He still not getting consistent protein with every meal and still does not understand sources of protein especially vegetarian Since his main meal is at lunchtime he probably will benefit from continuing the Glyset but currently is taking this after eating   PLAN:   Start taking Glyset right before starting the meal  Restart glucose monitoring and make sure his test strips are not out of 8  Check blood sugar at least every other day after various meals especially lunch, discussed blood sugar targets, to start Glyset at dinner also if blood sugars are higher after evening meal  No change in Kombiglyze XR or Glyset  Balanced meals with low amount of carbohydrates, more protein   Restart daily walking   [facility],Joe Occhipinti 12/15/2016, 3:32 PM   Note: This office note was prepared with Nutritional therapistDragon voice recognition system  technology. Any transcriptional errors that result from this process are unintentional.

## 2017-01-04 ENCOUNTER — Ambulatory Visit (INDEPENDENT_AMBULATORY_CARE_PROVIDER_SITE_OTHER): Payer: BLUE CROSS/BLUE SHIELD | Admitting: Nurse Practitioner

## 2017-01-04 ENCOUNTER — Encounter: Payer: Self-pay | Admitting: Nurse Practitioner

## 2017-01-04 VITALS — BP 104/68 | HR 92 | Temp 98.5°F | Resp 18 | Ht 67.0 in | Wt 146.0 lb

## 2017-01-04 DIAGNOSIS — J0140 Acute pansinusitis, unspecified: Secondary | ICD-10-CM

## 2017-01-04 DIAGNOSIS — J209 Acute bronchitis, unspecified: Secondary | ICD-10-CM

## 2017-01-04 MED ORDER — AMOXICILLIN-POT CLAVULANATE 875-125 MG PO TABS: 1.0000 | ORAL_TABLET | Freq: Two times a day (BID) | ORAL | 0 refills | Status: DC

## 2017-01-04 MED ORDER — METHYLPREDNISOLONE ACETATE 40 MG/ML IJ SUSP: 40.0000 mg | Freq: Once | INTRAMUSCULAR | Status: DC

## 2017-01-04 MED ORDER — BENZONATATE 100 MG PO CAPS: 100.0000 mg | ORAL_CAPSULE | Freq: Three times a day (TID) | ORAL | 0 refills | Status: DC | PRN

## 2017-01-04 MED ORDER — OXYMETAZOLINE HCL 0.05 % NA SOLN: 1.0000 | Freq: Two times a day (BID) | NASAL | 0 refills | Status: DC

## 2017-01-04 MED ORDER — FLUTICASONE PROPIONATE 50 MCG/ACT NA SUSP: 2.0000 | Freq: Every day | NASAL | 0 refills | Status: DC

## 2017-01-04 MED ORDER — ALBUTEROL SULFATE (2.5 MG/3ML) 0.083% IN NEBU: 2.5000 mg | INHALATION_SOLUTION | Freq: Once | RESPIRATORY_TRACT | Status: DC

## 2017-01-04 MED ORDER — DM-GUAIFENESIN ER 30-600 MG PO TB12: 1.0000 | ORAL_TABLET | Freq: Two times a day (BID) | ORAL | 0 refills | Status: DC | PRN

## 2017-01-04 NOTE — Patient Instructions (Signed)

## 2017-01-04 NOTE — Progress Notes (Signed)
Pre visit review using our clinic review tool, if applicable. No additional management support is needed unless otherwise documented below in the visit note. 

## 2017-01-04 NOTE — Progress Notes (Signed)
Subjective:  Patient ID: Joe Boyd, male    DOB: 12-14-1976  Age: 41 y.o. MRN: [id number]  CC: Acute Visit (body ache, runny nose, cough, headache x 1 week)   URI   This is a new problem. The current episode started 1 to 4 weeks ago. The problem has been waxing and waning. Associated symptoms include chest pain, congestion, coughing, headaches, a plugged ear sensation, rhinorrhea, sinus pain, sneezing, a sore throat, swollen glands and wheezing. Pertinent negatives include no abdominal pain, diarrhea, dysuria, ear pain, joint pain, joint swelling, nausea, neck pain or vomiting. He has tried acetaminophen, decongestant, increased fluids and NSAIDs (azithromycin x 3days) for the symptoms. The treatment provided no relief.    Outpatient Medications Prior to Visit  Medication Sig Dispense Refill  . glucose blood (BAYER CONTOUR TEST) test strip Test once every 3 days. 100 each 5  . KOMBIGLYZE XR 2.04-999 MG TB24 TAKE 2 TABLETS BY MOUTH DAILY 60 tablet 3  . miglitol (GLYSET) 25 MG tablet Take 1 tablet (25 mg total) by mouth 3 (three) times daily with meals. (Patient taking differently: Take 25 mg by mouth daily. ) 90 tablet 3  . Multiple Vitamin (MULTIVITAMIN WITH MINERALS) TABS tablet Take 1 tablet by mouth daily.     No facility-administered medications prior to visit.     ROS See HPI  Objective:  BP 104/68   Pulse 92   Temp 98.5 F (36.9 C) (Oral)   Resp 18   Ht 5\' 7"  (1.702 m)   Wt 146 lb (66.2 kg)   SpO2 98%   BMI 22.87 kg/m   BP Readings from Last 3 Encounters:  01/04/17 104/68  12/15/16 128/86  09/02/16 124/78    Wt Readings from Last 3 Encounters:  01/04/17 146 lb (66.2 kg)  12/15/16 150 lb (68 kg)  09/02/16 149 lb 6.4 oz (67.8 kg)    Physical Exam  Constitutional: He is oriented to person, place, and time. No distress.  HENT:  Right Ear: Tympanic membrane, external ear and ear canal normal.  Left Ear: Tympanic membrane and ear canal normal.  Nose:  Mucosal edema and rhinorrhea present. Right sinus exhibits maxillary sinus tenderness and frontal sinus tenderness. Left sinus exhibits maxillary sinus tenderness and frontal sinus tenderness.  Mouth/Throat: Uvula is midline. Posterior oropharyngeal erythema present. No oropharyngeal exudate.  Eyes: No scleral icterus.  Neck: Normal range of motion. Neck supple.  Cardiovascular: Normal rate and regular rhythm.   Pulmonary/Chest: Effort normal. He has wheezes. He has no rales. He exhibits no tenderness.  Musculoskeletal: He exhibits no edema.  Lymphadenopathy:    He has cervical adenopathy.  Neurological: He is alert and oriented to person, place, and time.  Vitals reviewed.   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 EXAM: CHEST - 1  VIEW COMPARISON:  May 03, 2014. FINDINGS: The heart size and mediastinal contours are within normal limits. Left lung is clear. Nodular density seen in the right middle lobe are significantly improved, although residual densities remain. The visualized skeletal structures are unremarkable. IMPRESSION: Significantly improved nodular density seen in right middle lobe noted on prior exam, consistent with improving inflammation. Electronically Signed   By: Roque Lias M.D.   On: 07/26/2014 16:01    Assessment & Plan:   Joe Boyd was seen today for acute visit.  Diagnoses and all orders for this visit:  Acute bronchitis,  unspecified organism -     albuterol (PROVENTIL) (2.5 MG/3ML) 0.083% nebulizer solution 2.5 mg; Take 3 mLs (2.5 mg total) by nebulization once. -     Discontinue: methylPREDNISolone acetate (DEPO-MEDROL) injection 40 mg; Inject 1 mL (40 mg total) into the muscle once. -     amoxicillin-clavulanate (AUGMENTIN) 875-125 MG tablet; Take 1 tablet by mouth 2 (two) times daily. -     benzonatate (TESSALON) 100 MG capsule; Take 1 capsule (100 mg total) by mouth 3 (three) times daily as needed for cough.  Acute non-recurrent pansinusitis -     albuterol (PROVENTIL) (2.5 MG/3ML) 0.083% nebulizer solution 2.5 mg; Take 3 mLs (2.5 mg total) by nebulization once. -     Discontinue: methylPREDNISolone acetate (DEPO-MEDROL) injection 40 mg; Inject 1 mL (40 mg total) into the muscle once. -     amoxicillin-clavulanate (AUGMENTIN) 875-125 MG tablet; Take 1 tablet by mouth 2 (two) times daily. -     fluticasone (FLONASE) 50 MCG/ACT nasal spray; Place 2 sprays into both nostrils daily. -     oxymetazoline (AFRIN NASAL SPRAY) 0.05 % nasal spray; Place 1 spray into both nostrils 2 (two) times daily. Use only for 3days, then stop -     dextromethorphan-guaiFENesin (MUCINEX DM) 30-600 MG 12hr tablet; Take 1 tablet by mouth 2 (two) times daily as needed for cough. -     benzonatate (TESSALON) 100 MG capsule; Take 1 capsule (100 mg total) by mouth 3 (three) times daily as needed for cough.   I am having Joe Boyd start on amoxicillin-clavulanate, fluticasone, oxymetazoline, dextromethorphan-guaiFENesin, and benzonatate. I am also having him maintain his glucose blood, multivitamin with minerals, miglitol, and KOMBIGLYZE XR. We will continue to administer albuterol.  Meds ordered this encounter  Medications  . albuterol (PROVENTIL) (2.5 MG/3ML) 0.083% nebulizer solution 2.5 mg  . DISCONTD: methylPREDNISolone acetate (DEPO-MEDROL) injection 40 mg  . amoxicillin-clavulanate (AUGMENTIN) 875-125 MG tablet    Sig: Take 1  tablet by mouth 2 (two) times daily.    Dispense:  14 tablet    Refill:  0    Order Specific Question:   Supervising Provider    Answer:   Tresa Garter [[id number]]  . fluticasone (FLONASE) 50 MCG/ACT nasal spray    Sig: Place 2 sprays into both nostrils daily.    Dispense:  16 g    Refill:  0    Order Specific Question:   Supervising Provider    Answer:   Tresa Garter [[id number]]  . oxymetazoline (AFRIN NASAL SPRAY) 0.05 % nasal spray    Sig: Place 1 spray into both nostrils 2 (two) times daily. Use only for 3days, then stop    Dispense:  30 mL    Refill:  0    Order Specific Question:   Supervising Provider    Answer:   Tresa Garter [[id number]]  . dextromethorphan-guaiFENesin (MUCINEX DM) 30-600 MG 12hr  tablet    Sig: Take 1 tablet by mouth 2 (two) times daily as needed for cough.    Dispense:  14 tablet    Refill:  0    Order Specific Question:   Supervising Provider    Answer:   Tresa GarterPLOTNIKOV, ALEKSEI V [[id number]]  . benzonatate (TESSALON) 100 MG capsule    Sig: Take 1 capsule (100 mg total) by mouth 3 (three) times daily as needed for cough.    Dispense:  20 capsule    Refill:  0    Order Specific Question:   Supervising Provider    Answer:   Tresa GarterPLOTNIKOV, ALEKSEI V [[id number]]    Follow-up: Return if symptoms worsen or fail to improve.  Alysia Pennaharlotte Zaheer Wageman, NP

## 2017-01-07 ENCOUNTER — Telehealth: Payer: Self-pay | Admitting: Endocrinology

## 2017-01-07 NOTE — Telephone Encounter (Signed)
Pt called in and said to disregard the first message that it is expensive the first time he buys it and then after that it will be affordable.

## 2017-01-07 NOTE — Telephone Encounter (Signed)
Pat need another alternative for  Medication KOMBIGLYZE XR 2.04-999 MG TB24  To expensive  [facility] [address] - [place], [place] - [id number] [phone] (Phone) [phone] (Fax)

## 2017-01-07 NOTE — Telephone Encounter (Signed)
[facility] fax # [phone]  Phone # [phone]  Send meds to KOMBIGLYZE XR 2.04-999 MG TB24 only one time a day, 60 tabs

## 2017-01-11 MED ORDER — SAXAGLIPTIN-METFORMIN ER 2.5-1000 MG PO TB24: 2.0000 | ORAL_TABLET | Freq: Every day | ORAL | 2 refills | Status: DC

## 2017-01-11 MED ORDER — MIGLITOL 25 MG PO TABS: 25.0000 mg | ORAL_TABLET | Freq: Every day | ORAL | 2 refills | Status: DC

## 2017-01-11 NOTE — Telephone Encounter (Signed)
I contacted the patient. He stated the Kombiglyze need to be refilled of 2 tabs per day and the glyset for 1 time per day. Refill submitted.

## 2017-01-11 NOTE — Telephone Encounter (Signed)
2 Tabs once daily

## 2017-01-11 NOTE — Telephone Encounter (Signed)
See message and please advise if ok to change to 1 time per day. Current med list states 2 times per day.  Thanks!

## 2017-03-08 ENCOUNTER — Other Ambulatory Visit (INDEPENDENT_AMBULATORY_CARE_PROVIDER_SITE_OTHER): Payer: BLUE CROSS/BLUE SHIELD

## 2017-03-08 DIAGNOSIS — E1165 Type 2 diabetes mellitus with hyperglycemia: Secondary | ICD-10-CM

## 2017-03-08 LAB — BASIC METABOLIC PANEL
BUN: 14 mg/dL (ref 6–23)
CALCIUM: 9.7 mg/dL (ref 8.4–10.5)
CO2: 30 mEq/L (ref 19–32)
Chloride: 103 mEq/L (ref 96–112)
Creatinine, Ser: 0.92 mg/dL (ref 0.40–1.50)
GFR: 95.52 mL/min (ref >60.00)
GLUCOSE: 106 mg/dL — AB (ref 70–99)
Potassium: 4.9 mEq/L (ref 3.5–5.1)
SODIUM: 139 meq/L (ref 135–145)

## 2017-03-08 LAB — LIPID PANEL
Cholesterol: 128 mg/dL (ref 0–200)
HDL: 35.4 mg/dL — ABNORMAL LOW (ref >39.00)
LDL Cholesterol: 65 mg/dL (ref 0–99)
NONHDL: 92.46
Total CHOL/HDL Ratio: 4
Triglycerides: 139 mg/dL (ref 0.0–149.0)
VLDL: 27.8 mg/dL (ref 0.0–40.0)

## 2017-03-08 LAB — MICROALBUMIN / CREATININE URINE RATIO
CREATININE, U: 180.2 mg/dL
MICROALB UR: 0.8 mg/dL (ref 0.0–1.9)
MICROALB/CREAT RATIO: 0.4 mg/g (ref 0.0–30.0)

## 2017-03-08 LAB — HEMOGLOBIN A1C: HEMOGLOBIN A1C: 7.2 % — AB (ref 4.6–6.5)

## 2017-03-10 ENCOUNTER — Telehealth: Payer: Self-pay | Admitting: Family

## 2017-03-10 NOTE — Telephone Encounter (Signed)
Ok with me 

## 2017-03-10 NOTE — Telephone Encounter (Signed)
Patient is calling to [facility]. Helane RimaErica Wallace.  Please respond at your earliest convenience to acknowledge our patients request.  Thank you,  -LL

## 2017-03-11 ENCOUNTER — Other Ambulatory Visit: Payer: BLUE CROSS/BLUE SHIELD

## 2017-03-11 NOTE — Telephone Encounter (Signed)
Okay 

## 2017-03-15 ENCOUNTER — Ambulatory Visit (INDEPENDENT_AMBULATORY_CARE_PROVIDER_SITE_OTHER): Payer: BLUE CROSS/BLUE SHIELD | Admitting: Endocrinology

## 2017-03-15 ENCOUNTER — Encounter: Payer: Self-pay | Admitting: Endocrinology

## 2017-03-15 VITALS — BP 128/78 | HR 84 | Ht 67.0 in | Wt 149.0 lb

## 2017-03-15 DIAGNOSIS — E1165 Type 2 diabetes mellitus with hyperglycemia: Secondary | ICD-10-CM | POA: Diagnosis not present

## 2017-03-15 NOTE — Progress Notes (Signed)
Patient ID: Joe Boyd, male   DOB: Jul 18, 1976, 41 y.o.   MRN: [id number]           Reason for Appointment: Follow-up visit for Type 2 Diabetes  Referring physician: none  History of Present Illness:          Diagnosis: Type 2 diabetes mellitus, date of diagnosis: 2012        Past history:  At time of diagnosis he was having symptoms of increased thirst and urination and he was seen by his physician when he started feeling weak and dizzy also. His A1c at diagnosis was 12.8. He thinks he was treated with metformin only and also he started changing his diet along with eliminating sweet soft drinks.  Initially was treated with only 500 mg of metformin ER and in 2014 this was increased to 1000 mg.  He has not had any other medications for diabetes. He was last seen by his endocrinologist in [place] in 07/2014 when his A1c was 6.5 and no changes were made A1c of over 7%  in 7/16 and he was started on Kombiglyze XR  Recent history:   In 6/17 with his A1c going up to 7.6 he was given Glyset 25 mg at lunch also in addition to Kombiglyze  A1c is still over 7%, now 7.2 compared to 7.4   Current management, blood sugar patterns and problems identified:   He has followed instructions on his last visit to change his Glyset before eating instead of after eating  Again not clear how consistently he has monitored his blood sugars after meals as he did not bring his monitor  However he thinks his blood sugars are usually not over 150 after meals  Fasting lab glucose was 106, he also reports readings in the low 100+ range  Review of his diet indicates that he is mostly vegetarian with  no protein at lunch and dinner meals which are usually comprised of bread and vegetables  He will have a croissant egg and sausage breakfast but no blood sugars are being checked after this meals  He will check in twice a week  He has done a little exercise, walking up to 40 minutes       Oral  hypoglycemic drugs the patient is taking are:   Kombiglyze XR 2.04/999, 2 tablets daily, Glyset 25 mg at lunch      Side effects from medications have been: None Compliance with the medical regimen: Fair   Glucose monitoring:  done occasionally        Glucometer:  Contour     Blood Glucose readings:   F about 118 PC lunch about 148, usually not checking after dinner or breakfast  Self-care:  Meals: 3 meals per day. Breakfast is usually a small sandwich with chicken Usually has a sandwich or roti and vegetables for lunch and again roti and vegetables for dinner.   Usually does not eat much yogurt, sometimes has chicken.    Usually avoiding snacks          Exercise:   walking 35 minutes        Dietician visit, most recent: 7/16                Weight history:   Wt Readings from Last 3 Encounters:  03/15/17 149 lb (67.6 kg)  01/04/17 146 lb (66.2 kg)  12/15/16 150 lb (68 kg)    Glycemic control:  A1c in 07/2014 was 6.5  Lab Results  Component  Value Date   HGBA1C 7.2 (H) 03/08/2017   HGBA1C 7.4 (H) 12/07/2016   HGBA1C 7.2 (H) 08/28/2016   Lab Results  Component Value Date   MICROALBUR 0.8 03/08/2017   LDLCALC 65 03/08/2017   CREATININE 0.92 03/08/2017   No visits with results within 1 Week(s) from this visit.  Latest known visit with results is:  Lab on 03/08/2017  Component Date Value Ref Range Status  . Hgb A1c MFr Bld 03/08/2017 7.2* 4.6 - 6.5 % Final  . Sodium 03/08/2017 139  135 - 145 mEq/L Final  . Potassium 03/08/2017 4.9  3.5 - 5.1 mEq/L Final  . Chloride 03/08/2017 103  96 - 112 mEq/L Final  . CO2 03/08/2017 30  19 - 32 mEq/L Final  . Glucose, Bld 03/08/2017 106* 70 - 99 mg/dL Final  . BUN 16/09/9603 14  6 - 23 mg/dL Final  . Creatinine, Ser 03/08/2017 0.92  0.40 - 1.50 mg/dL Final  . Calcium 54/08/8118 9.7  8.4 - 10.5 mg/dL Final  . GFR 14/78/2956 95.52  >60.00 mL/min Final  . Microalb, Ur 03/08/2017 0.8  0.0 - 1.9 mg/dL Final  . Creatinine,U 21/30/8657  180.2  mg/dL Final  . Microalb Creat Ratio 03/08/2017 0.4  0.0 - 30.0 mg/g Final  . Cholesterol 03/08/2017 128  0 - 200 mg/dL Final  . Triglycerides 03/08/2017 139.0  0.0 - 149.0 mg/dL Final  . HDL 84/69/6295 35.40* >39.00 mg/dL Final  . VLDL 28/41/3244 27.8  0.0 - 40.0 mg/dL Final  . LDL Cholesterol 03/08/2017 65  0 - 99 mg/dL Final  . Total CHOL/HDL Ratio 03/08/2017 4   Final  . NonHDL 03/08/2017 92.46   Final      Allergies as of 03/15/2017   No Known Allergies     Medication List       Accurate as of 03/15/17  3:32 PM. Always use your most recent med list.          amoxicillin-clavulanate 875-125 MG tablet Commonly known as:  AUGMENTIN Take 1 tablet by mouth 2 (two) times daily.   benzonatate 100 MG capsule Commonly known as:  TESSALON Take 1 capsule (100 mg total) by mouth 3 (three) times daily as needed for cough.   dextromethorphan-guaiFENesin 30-600 MG 12hr tablet Commonly known as:  MUCINEX DM Take 1 tablet by mouth 2 (two) times daily as needed for cough.   fluticasone 50 MCG/ACT nasal spray Commonly known as:  FLONASE Place 2 sprays into both nostrils daily.   glucose blood test strip Commonly known as:  BAYER CONTOUR TEST Test once every 3 days.   miglitol 25 MG tablet Commonly known as:  GLYSET Take 1 tablet (25 mg total) by mouth daily.   multivitamin with minerals Tabs tablet Take 1 tablet by mouth daily.   oxymetazoline 0.05 % nasal spray Commonly known as:  AFRIN NASAL SPRAY Place 1 spray into both nostrils 2 (two) times daily. Use only for 3days, then stop   Saxagliptin-Metformin 2.04-999 MG Tb24 Commonly known as:  KOMBIGLYZE XR Take 2 tablets by mouth daily.       Allergies: No Known Allergies  Past Medical History:  Diagnosis Date  . Diabetes mellitus without complication (HCC)   . Tuberculosis    Finished Rx in 8/15    No past surgical history on file.  Family History  Problem Relation Age of Onset  . Diabetes Mother   .  Hypertension Mother   . Thyroid disease Mother   . Diabetes  Father   . Hypertension Father   . Cancer Neg Hx   . Heart disease Neg Hx     Social History:  reports that he has never smoked. He has never used smokeless tobacco. He reports that he drinks alcohol. He reports that he does not use drugs.    Review of Systems         Lipids:  LDL and triglycerides Have been normal without medications HDL relatively low      Lab Results  Component Value Date   CHOL 128 03/08/2017   HDL 35.40 (L) 03/08/2017   LDLCALC 65 03/08/2017   TRIG 139.0 03/08/2017   CHOLHDL 4 03/08/2017                  Thyroid: Has History of a small goiter   Lab Results  Component Value Date   TSH 1.18 05/26/2016     LABS:  No visits with results within 1 Week(s) from this visit.  Latest known visit with results is:  Lab on 03/08/2017  Component Date Value Ref Range Status  . Hgb A1c MFr Bld 03/08/2017 7.2* 4.6 - 6.5 % Final  . Sodium 03/08/2017 139  135 - 145 mEq/L Final  . Potassium 03/08/2017 4.9  3.5 - 5.1 mEq/L Final  . Chloride 03/08/2017 103  96 - 112 mEq/L Final  . CO2 03/08/2017 30  19 - 32 mEq/L Final  . Glucose, Bld 03/08/2017 106* 70 - 99 mg/dL Final  . BUN 21/30/8657 14  6 - 23 mg/dL Final  . Creatinine, Ser 03/08/2017 0.92  0.40 - 1.50 mg/dL Final  . Calcium 84/69/6295 9.7  8.4 - 10.5 mg/dL Final  . GFR 28/41/3244 95.52  >60.00 mL/min Final  . Microalb, Ur 03/08/2017 0.8  0.0 - 1.9 mg/dL Final  . Creatinine,U 12/30/7251 180.2  mg/dL Final  . Microalb Creat Ratio 03/08/2017 0.4  0.0 - 30.0 mg/g Final  . Cholesterol 03/08/2017 128  0 - 200 mg/dL Final  . Triglycerides 03/08/2017 139.0  0.0 - 149.0 mg/dL Final  . HDL 66/44/0347 35.40* >39.00 mg/dL Final  . VLDL 42/59/5638 27.8  0.0 - 40.0 mg/dL Final  . LDL Cholesterol 03/08/2017 65  0 - 99 mg/dL Final  . Total CHOL/HDL Ratio 03/08/2017 4   Final  . NonHDL 03/08/2017 92.46   Final    Physical Examination:  BP 128/78   Pulse  84   Ht 5\' 7"  (1.702 m)   Wt 149 lb (67.6 kg)   SpO2 98%   BMI 23.34 kg/m      ASSESSMENT:  Diabetes type 2 , nonobese See history of present illness for detailed discussion of current diabetes management, blood sugar patterns and problems identified His A1c is still over 7%  Since his fasting readings appear to be fairly good he likely has had some postprandial hyperglycemia In the past A1c has been as low as 6.5 Main difficulty is getting his diet balanced with enough protein especially lunch and dinner He thinks his blood sugars are somewhat better with dying to take Glyset consistently before lunch  LIPIDS: He has good levels except low HDL  PLAN:   Start taking Glyset right before dinner also since his meal at dinnertime is also similar to lunch even the smaller and portion  May need to check blood sugars more often after breakfast and lunch also to determine need for adjustment of the Glyset dose  Discussed sources of protein for lunch and dinner including yogurt  Follow-up in 3 months  Bring monitor on follow-up for review   [facility] HospitalKUMAR,Emmamae Mcnamara 03/15/2017, 3:32 PM   Note: This office note was prepared with Dragon voice recognition system technology. Any transcriptional errors that result from this process are unintentional.

## 2017-03-15 NOTE — Patient Instructions (Addendum)
Take Glyset Before dinner also  Add yogurt  Check blood sugars on waking up  weekly  Also check blood sugars about 2 hours after a meal and do this after different meals by rotation  Recommended blood sugar levels on waking up is 90-130 and about 2 hours after meal is 130-160  Please bring your blood sugar monitor to each visit, thank you

## 2017-04-06 ENCOUNTER — Ambulatory Visit: Payer: BLUE CROSS/BLUE SHIELD | Admitting: Family Medicine

## 2017-04-13 ENCOUNTER — Ambulatory Visit: Payer: BLUE CROSS/BLUE SHIELD | Admitting: Family Medicine

## 2017-04-19 ENCOUNTER — Ambulatory Visit (INDEPENDENT_AMBULATORY_CARE_PROVIDER_SITE_OTHER): Payer: BLUE CROSS/BLUE SHIELD | Admitting: Family Medicine

## 2017-04-19 ENCOUNTER — Encounter: Payer: Self-pay | Admitting: Family Medicine

## 2017-04-19 VITALS — BP 138/78 | HR 82 | Temp 98.7°F | Ht 67.0 in | Wt 147.8 lb

## 2017-04-19 DIAGNOSIS — Z0000 Encounter for general adult medical examination without abnormal findings: Secondary | ICD-10-CM

## 2017-04-19 NOTE — Progress Notes (Deleted)
Joe Boyd is a 41 y.o. male is here to [facility].   History of Present Illness:   Insurance claims handler, CMA, acting as scribe for Dr. Earlene Plater.138/80  HPI  Health Maintenance Due  Topic Date Due  . OPHTHALMOLOGY EXAM  03/24/2017    PMHx, SurgHx, SocialHx, Medications, and Allergies were reviewed in the Visit Navigator and updated as appropriate.   Past Medical History:  Diagnosis Date  . Diabetes mellitus without complication (HCC)   . Tuberculosis    Finished Rx in 8/15   History reviewed. No pertinent surgical history.  Family History  Problem Relation Age of Onset  . Diabetes Mother   . Hypertension Mother   . Thyroid disease Mother   . Diabetes Father   . Hypertension Father   . Cancer Neg Hx   . Heart disease Neg Hx    Social History  Substance Use Topics  . Smoking status: Never Smoker  . Smokeless tobacco: Never Used  . Alcohol use Yes     Comment: occasionally   Current Medications and Allergies:   Current Outpatient Prescriptions:  .  glucose blood (BAYER CONTOUR TEST) test strip, Test once every 3 days., Disp: 100 each, Rfl: 5 .  miglitol (GLYSET) 25 MG tablet, Take 1 tablet (25 mg total) by mouth daily., Disp: 90 tablet, Rfl: 2 .  Multiple Vitamin (MULTIVITAMIN WITH MINERALS) TABS tablet, Take 1 tablet by mouth daily., Disp: , Rfl:  .  Probiotic Product (PROBIOTIC-10) CHEW, Chew 2 capsules by mouth at bedtime., Disp: , Rfl:  .  ranitidine (ZANTAC) 150 MG tablet, Take 150 mg by mouth daily., Disp: , Rfl:  .  Saxagliptin-Metformin (KOMBIGLYZE XR) 2.04-999 MG TB24, Take 2 tablets by mouth daily., Disp: 180 tablet, Rfl: 2  Current Facility-Administered Medications:  .  albuterol (PROVENTIL) (2.5 MG/3ML) 0.083% nebulizer solution 2.5 mg, 2.5 mg, Nebulization, Once, Anne Ng, NP No Known Allergies Review of Systems:   ROS  Vitals:   Vitals:   04/19/17 1541  Weight: 147 lb 12.8 oz (67 kg)  Height:  (1.702 m)     Body mass index  is 23.15 kg/m.  Physical Exam:   Physical Exam   Assessment and Plan:    There are no diagnoses linked to this encounter.  . Reviewed expectations re: course of current medical issues. . Discussed self-management of symptoms. . Outlined signs and symptoms indicating need for more acute intervention. . Patient verbalized understanding and all questions were answered. . See orders for this visit as documented in the electronic medical record. . Patient received an After Visit Summary.  Records requested if needed. I spent ** minutes with this patient, greater than 50% was face-to-face time counseling regarding the above diagnoses.  CMA served as Neurosurgeon during this visit. History, Physical, and Plan performed by medical provider. Documentation and orders reviewed and attested to. Helane Rima, D.O.  Helane Rima, D.O. [place] [place] 04/19/2017   Follow-up: No Follow-up on file.  Meds ordered this encounter  Medications  . ranitidine (ZANTAC) 150 MG tablet    Sig: Take 150 mg by mouth daily.  . Probiotic Product (PROBIOTIC-10) CHEW    Sig: Chew 2 capsules by mouth at bedtime.   Medications Discontinued During This Encounter  Medication Reason  . amoxicillin-clavulanate (AUGMENTIN) 875-125 MG tablet Error  . benzonatate (TESSALON) 100 MG capsule Error  . dextromethorphan-guaiFENesin (MUCINEX DM) 30-600 MG 12hr tablet Error  . fluticasone (FLONASE) 50 MCG/ACT nasal spray Error  .  oxymetazoline (AFRIN NASAL SPRAY) 0.05 % nasal spray Error   No orders of the defined types were placed in this encounter.   ***Did you take ownership of the note?

## 2017-04-19 NOTE — Progress Notes (Signed)
Subjective:  Insurance claims handler, CMA, acting as scribe for Dr. Earlene Plater.   Joe Boyd is a 41 y.o. male and is here for a comprehensive physical exam.  HPI: No concerns.  There are no preventive care reminders to display for this patient.  PMHx, SurgHx, SocialHx, Medications, and Allergies were reviewed in the Visit Navigator and updated as appropriate.   Past Medical History:  Diagnosis Date  . Diabetes mellitus without complication (HCC)   . Tuberculosis    Finished Rx in 8/15   History reviewed. No pertinent surgical history.  Family History  Problem Relation Age of Onset  . Diabetes Mother   . Hypertension Mother   . Thyroid disease Mother   . Diabetes Father   . Hypertension Father   . Cancer Neg Hx   . Heart disease Neg Hx    Social History  Substance Use Topics  . Smoking status: Never Smoker  . Smokeless tobacco: Never Used  . Alcohol use Yes     Comment: occasionally   Review of Systems:   Review of Systems  Constitutional: Negative for chills and fever.  HENT: Negative for congestion, ear pain and sore throat.   Eyes: Negative for blurred vision.  Respiratory: Negative for cough, shortness of breath and wheezing.   Cardiovascular: Negative for chest pain and palpitations.  Gastrointestinal: Negative for abdominal pain, nausea and vomiting.  Musculoskeletal: Negative for back pain and neck pain.  Skin: Negative for rash.  Neurological: Negative for dizziness, loss of consciousness and headaches.  Psychiatric/Behavioral: Negative for depression. The patient is not nervous/anxious.    Objective:   Vitals:   04/19/17 1541  BP: 138/78  Pulse: 82  Temp: 98.7 F (37.1 C)   Body mass index is 23.15 kg/m.  General: Alert, cooperative, no distress, appears stated age  Head:  Normocephalic, without obvious abnormality, atraumatic  Eyes:  PERRL, conjunctiva/corneas clear, EOM's intact, fundi benign, both eyes       Ears:  Normal TM's and external ear  canals, both ears  Nose: Nares normal, septum midline, mucosa normal, no drainage or sinus tenderness  Throat: Lips, mucosa, and tongue normal; teeth and gums normal  Neck: Supple, symmetrical, trachea midline, no adenopathy; thyroid:  No enlargement/tenderness/nodules; no carotit bruit or JVD  Back:   Symmetric, no curvature, ROM normal, no CVA tenderness  Lungs:   Clear to auscultation bilaterally, respirations unlabored  Chest wall:  No tenderness or deformity  Heart:  Regular rate and rhythm, S1 and S2 normal, no murmur, rub   or gallop  Abdomen:   Soft, non-tender, bowel sounds active, no masses, no organomegaly  Extremities: Extremities normal, atraumatic, no cyanosis or edema  Prostate: Not done  Skin: Skin color, texture, turgor normal, no rashes or lesions  Lymph nodes: Cervical, supraclavicular, and axillary nodes normal  Neurologic: CNII-XII grossly intact. Normal strength, sensation and reflexes throughout    Assessment/Plan:   Joe Boyd was seen today for annual exam.  Diagnoses and all orders for this visit:  Routine physical examination   Well Adult Exam: Labs ordered: No. Patient counseling was done. See below for items discussed. Discussed the patient's BMI.  The BMI BMI is in the acceptable range Follow up next physical in 1 year.  Patient Counseling:   Nutrition: Stressed importance of moderation in sodium/caffeine intake, saturated fat and cholesterol, caloric balance, sufficient intake of fresh fruits, vegetables, and fiber.    Stressed the importance of regular exercise.     Substance Abuse: Discussed cessation/primary  prevention of tobacco, alcohol, or other drug use; driving or other dangerous activities under the influence; availability of treatment for abuse.     Injury prevention: Discussed safety belts, safety helmets, smoke detector, smoking near bedding or upholstery.     Sexuality: Discussed sexually transmitted diseases, partner selection,  use of condoms, avoidance of unintended pregnancy  and contraceptive alternatives.     Dental health: Discussed importance of regular tooth brushing, flossing, and dental visits.    Health maintenance and immunizations reviewed. Please refer to Health maintenance section.    Helane Rima, DO [facility]  [place] served as Neurosurgeon during this visit. History, Physical, and Plan performed by medical provider. Documentation and orders reviewed and attested to. Helane Rima, D.O.

## 2017-04-19 NOTE — Progress Notes (Signed)
Pre visit review using our clinic review tool, if applicable. No additional management support is needed unless otherwise documented below in the visit note. 

## 2017-06-09 ENCOUNTER — Other Ambulatory Visit (INDEPENDENT_AMBULATORY_CARE_PROVIDER_SITE_OTHER): Payer: BLUE CROSS/BLUE SHIELD

## 2017-06-09 DIAGNOSIS — E1165 Type 2 diabetes mellitus with hyperglycemia: Secondary | ICD-10-CM | POA: Diagnosis not present

## 2017-06-09 LAB — HEMOGLOBIN A1C: HEMOGLOBIN A1C: 7.3 % — AB (ref 4.6–6.5)

## 2017-06-09 LAB — BASIC METABOLIC PANEL
BUN: 15 mg/dL (ref 6–23)
CHLORIDE: 105 meq/L (ref 96–112)
CO2: 28 meq/L (ref 19–32)
Calcium: 10 mg/dL (ref 8.4–10.5)
Creatinine, Ser: 1.02 mg/dL (ref 0.40–1.50)
GFR: 84.7 mL/min (ref >60.00)
Glucose, Bld: 151 mg/dL — ABNORMAL HIGH (ref 70–99)
POTASSIUM: 4.4 meq/L (ref 3.5–5.1)
SODIUM: 141 meq/L (ref 135–145)

## 2017-06-15 ENCOUNTER — Encounter: Payer: Self-pay | Admitting: Endocrinology

## 2017-06-15 ENCOUNTER — Ambulatory Visit (INDEPENDENT_AMBULATORY_CARE_PROVIDER_SITE_OTHER): Payer: BLUE CROSS/BLUE SHIELD | Admitting: Endocrinology

## 2017-06-15 VITALS — BP 122/88 | HR 85 | Ht 67.0 in | Wt 146.6 lb

## 2017-06-15 DIAGNOSIS — E1165 Type 2 diabetes mellitus with hyperglycemia: Secondary | ICD-10-CM | POA: Diagnosis not present

## 2017-06-15 MED ORDER — MIGLITOL 50 MG PO TABS: ORAL_TABLET | ORAL | 3 refills | Status: DC

## 2017-06-15 NOTE — Progress Notes (Signed)
Patient ID: Joe Boyd, male   DOB: 05/05/1976, 41 y.o.   MRN: [id number]           Reason for Appointment: Follow-up visit for Type 2 Diabetes  Referring physician: none  History of Present Illness:          Diagnosis: Type 2 diabetes mellitus, date of diagnosis: 2012        Past history:  At time of diagnosis he was having symptoms of increased thirst and urination and he was seen by his physician when he started feeling weak and dizzy also. His A1c at diagnosis was 12.8. He thinks he was treated with metformin only and also he started changing his diet along with eliminating sweet soft drinks.  Initially was treated with only 500 mg of metformin ER and in 2014 this was increased to 1000 mg.  He has not had any other medications for diabetes. He was last seen by his endocrinologist in [place] in 07/2014 when his A1c was 6.5 and no changes were made A1c of over 7%  in 7/16 and he was started on Kombiglyze XR  Recent history:   In 6/17 with his A1c going up to 7.6 he was given Glyset 25 mg at lunch also in addition to Kombiglyze  A1c is still over 7%, now 7.3   Current management, blood sugar patterns and problems identified:   He has not followed instructions on his last visit to start taking Glyset before dinnertime  This is despite his meals being about the same at lunch and supper time with carbohydrate  Although he thinks he is trying to act some protein with most of his meals not clear what his postprandial readings are, A1c not better  He says he has been having stress with work  Has not checked his blood sugar  However has been trying to walk almost every evening       Oral hypoglycemic drugs the patient is taking are:   Kombiglyze XR 2.04/999, 2 tablets daily, Glyset 25 mg at lunch      Side effects from medications have been: None Compliance with the medical regimen: Fair   Glucose monitoring:  done occasionally, none recently        Glucometer:  Contour        Blood Glucose readings: ?  Lab glucose was 151 after coffee and flavored creamer  Self-care:  Meals: 3 meals per day. Breakfast is usually a small sandwich Usually has a sandwich or roti and vegetables for lunch and again roti and vegetables for dinner.   More yogurt, sometimes has chicken.    Usually avoiding snacks          Exercise:   walking 45 minutes at least 5 days a week         Dietician visit, most recent: 7/16                Weight history:   Wt Readings from Last 3 Encounters:  06/15/17 146 lb 9.6 oz (66.5 kg)  04/19/17 147 lb 12.8 oz (67 kg)  03/15/17 149 lb (67.6 kg)    Glycemic control:  A1c in 07/2014 was 6.5  Lab Results  Component Value Date   HGBA1C 7.3 (H) 06/09/2017   HGBA1C 7.2 (H) 03/08/2017   HGBA1C 7.4 (H) 12/07/2016   Lab Results  Component Value Date   MICROALBUR 0.8 03/08/2017   LDLCALC 65 03/08/2017   CREATININE 1.02 06/09/2017   Lab on 06/09/2017  Component  Date Value Ref Range Status  . Hgb A1c MFr Bld 06/09/2017 7.3* 4.6 - 6.5 % Final   Glycemic Control Guidelines for People with Diabetes:Non Diabetic:  <6%Goal of Therapy: <7%Additional Action Suggested:  >8%   . Sodium 06/09/2017 141  135 - 145 mEq/L Final  . Potassium 06/09/2017 4.4  3.5 - 5.1 mEq/L Final  . Chloride 06/09/2017 105  96 - 112 mEq/L Final  . CO2 06/09/2017 28  19 - 32 mEq/L Final  . Glucose, Bld 06/09/2017 151* 70 - 99 mg/dL Final  . BUN 16/09/9603 15  6 - 23 mg/dL Final  . Creatinine, Ser 06/09/2017 1.02  0.40 - 1.50 mg/dL Final  . Calcium 54/08/8118 10.0  8.4 - 10.5 mg/dL Final  . GFR 14/78/2956 84.70  >60.00 mL/min Final      Allergies as of 06/15/2017   No Known Allergies     Medication List       Accurate as of 06/15/17  3:25 PM. Always use your most recent med list.          glucose blood test strip Commonly known as:  BAYER CONTOUR TEST Test once every 3 days.   miglitol 25 MG tablet Commonly known as:  GLYSET Take 1 tablet (25 mg total)  by mouth daily.   multivitamin with minerals Tabs tablet Take 1 tablet by mouth daily.   PROBIOTIC-10 Chew Chew 2 capsules by mouth at bedtime.   ranitidine 150 MG tablet Commonly known as:  ZANTAC Take 150 mg by mouth daily.   Saxagliptin-Metformin 2.04-999 MG Tb24 Commonly known as:  KOMBIGLYZE XR Take 2 tablets by mouth daily.       Allergies: No Known Allergies  Past Medical History:  Diagnosis Date  . Diabetes mellitus without complication (HCC)   . Tuberculosis    Finished Rx in 8/15    No past surgical history on file.  Family History  Problem Relation Age of Onset  . Diabetes Mother   . Hypertension Mother   . Thyroid disease Mother   . Diabetes Father   . Hypertension Father   . Cancer Neg Hx   . Heart disease Neg Hx     Social History:  reports that he has never smoked. He has never used smokeless tobacco. He reports that he drinks alcohol. He reports that he does not use drugs.    Review of Systems         Lipids:  LDL and triglycerides Have been normal without medications HDL  low      Lab Results  Component Value Date   CHOL 128 03/08/2017   HDL 35.40 (L) 03/08/2017   LDLCALC 65 03/08/2017   TRIG 139.0 03/08/2017   CHOLHDL 4 03/08/2017                  Thyroid: Has History of a small goiter   Lab Results  Component Value Date   TSH 1.18 05/26/2016     LABS:  Lab on 06/09/2017  Component Date Value Ref Range Status  . Hgb A1c MFr Bld 06/09/2017 7.3* 4.6 - 6.5 % Final   Glycemic Control Guidelines for People with Diabetes:Non Diabetic:  <6%Goal of Therapy: <7%Additional Action Suggested:  >8%   . Sodium 06/09/2017 141  135 - 145 mEq/L Final  . Potassium 06/09/2017 4.4  3.5 - 5.1 mEq/L Final  . Chloride 06/09/2017 105  96 - 112 mEq/L Final  . CO2 06/09/2017 28  19 - 32 mEq/L Final  .  Glucose, Bld 06/09/2017 151* 70 - 99 mg/dL Final  . BUN 16/10/960406/13/2018 15  6 - 23 mg/dL Final  . Creatinine, Ser 06/09/2017 1.02  0.40 - 1.50 mg/dL  Final  . Calcium 54/09/811906/13/2018 10.0  8.4 - 10.5 mg/dL Final  . GFR 14/78/295606/13/2018 84.70  >60.00 mL/min Final    Physical Examination:  BP 122/88   Pulse 85   Ht 5\' 7"  (1.702 m)   Wt 146 lb 9.6 oz (66.5 kg)   SpO2 98%   BMI 22.96 kg/m      ASSESSMENT:  Diabetes type 2 , nonobese See history of present illness for detailed discussion of current diabetes management, blood sugar patterns and problems identified  Currently on Kombiglyze XR and Glyset  His A1c is still over 7%  Without his sugar monitoring at home and not clear when he has high readings In the past fasting readings have not been high and the lab glucose was not quite fasting  Most likely is having high postprandial readings and more than likely after supper when he does not take any Glyset  PLAN:   Start taking 50 mg Glyset right before dinner also  New prescription has been sent  More consistent glucose monitoring at various times  He should at least check readings frequently a couple of weeks before he comes in and present his monitor here  Follow-up in 3 months   Sadiyah Kangas 06/15/2017, 3:25 PM   Note: This office note was prepared with Insurance underwriterDragon voice recognition system technology. Any transcriptional errors that result from this process are unintentional.

## 2017-06-15 NOTE — Patient Instructions (Addendum)
Start taking Glyset right before dinner also    Check blood sugars on waking up  2/7 days  Also check blood sugars about 2 hours after a meal and do this after different meals by rotation  Recommended blood sugar levels on waking up is 90-130 and about 2 hours after meal is 130-160  Please bring your blood sugar monitor to each visit, thank you

## 2017-09-10 ENCOUNTER — Other Ambulatory Visit: Payer: BLUE CROSS/BLUE SHIELD

## 2017-09-15 ENCOUNTER — Ambulatory Visit: Payer: BLUE CROSS/BLUE SHIELD | Admitting: Endocrinology

## 2017-09-21 ENCOUNTER — Other Ambulatory Visit: Payer: BLUE CROSS/BLUE SHIELD

## 2017-10-12 ENCOUNTER — Other Ambulatory Visit: Payer: Self-pay | Admitting: Endocrinology

## 2017-10-12 ENCOUNTER — Ambulatory Visit: Payer: BLUE CROSS/BLUE SHIELD | Admitting: Family Medicine

## 2017-10-13 ENCOUNTER — Ambulatory Visit (INDEPENDENT_AMBULATORY_CARE_PROVIDER_SITE_OTHER): Payer: BLUE CROSS/BLUE SHIELD | Admitting: Family Medicine

## 2017-10-13 ENCOUNTER — Ambulatory Visit (INDEPENDENT_AMBULATORY_CARE_PROVIDER_SITE_OTHER): Payer: BLUE CROSS/BLUE SHIELD

## 2017-10-13 VITALS — BP 110/70 | HR 73 | Temp 98.5°F | Ht 67.0 in | Wt 134.8 lb

## 2017-10-13 DIAGNOSIS — R1032 Left lower quadrant pain: Secondary | ICD-10-CM

## 2017-10-13 DIAGNOSIS — Z23 Encounter for immunization: Secondary | ICD-10-CM

## 2017-10-13 DIAGNOSIS — M545 Low back pain, unspecified: Secondary | ICD-10-CM

## 2017-10-13 DIAGNOSIS — R1030 Lower abdominal pain, unspecified: Secondary | ICD-10-CM | POA: Diagnosis not present

## 2017-10-13 DIAGNOSIS — E119 Type 2 diabetes mellitus without complications: Secondary | ICD-10-CM | POA: Diagnosis not present

## 2017-10-13 DIAGNOSIS — R350 Frequency of micturition: Secondary | ICD-10-CM

## 2017-10-13 LAB — POCT URINALYSIS DIPSTICK
Bilirubin, UA: NEGATIVE
Blood, UA: NEGATIVE
Glucose, UA: NEGATIVE
Ketones, UA: NEGATIVE
Leukocytes, UA: NEGATIVE
Nitrite, UA: NEGATIVE
Protein, UA: NEGATIVE
Spec Grav, UA: >=1.03 — AB (ref 1.010–1.025)
Urobilinogen, UA: 0.2 E.U./dL
pH, UA: 6 (ref 5.0–8.0)

## 2017-10-13 LAB — POCT GLYCOSYLATED HEMOGLOBIN (HGB A1C): Hemoglobin A1C: 6.5

## 2017-10-13 MED ORDER — DICLOFENAC SODIUM 75 MG PO TBEC: 75.0000 mg | DELAYED_RELEASE_TABLET | Freq: Two times a day (BID) | ORAL | 0 refills | Status: DC

## 2017-10-13 NOTE — Patient Instructions (Addendum)
Sacroiliac Joint Dysfunction Sacroiliac joint dysfunction is a condition that causes inflammation on one or both sides of the sacroiliac (SI) joint. The SI joint connects the lower part of the spine (sacrum) with the two upper portions of the pelvis (ilium). This condition causes deep aching or burning pain in the low back. In some cases, the pain may also spread into one or both buttocks or hips or spread down the legs. What are the causes? This condition may be caused by:  Pregnancy. During pregnancy, extra stress is put on the SI joints because the pelvis widens.  Injury, such as: ? Car accidents. ? Sport-related injuries. ? Work-related injuries.  Having one leg that is shorter than the other.  Conditions that affect the joints, such as: ? Rheumatoid arthritis. ? Gout. ? Psoriatic arthritis. ? Joint infection (septic arthritis).  Sometimes, the cause of SI joint dysfunction is not known. What are the signs or symptoms? Symptoms of this condition include:  Aching or burning pain in the lower back. The pain may also spread to other areas, such as: ? Buttocks. ? Groin. ? Thighs and legs.  Muscle spasms in or around the painful areas.  Increased pain when standing, walking, running, stair climbing, bending, or lifting.  How is this diagnosed? Your health care provider will do a physical exam and take your medical history. During the exam, the health care provider may move one or both of your legs to different positions to check for pain. Various tests may be done to help verify the diagnosis, including:  Imaging tests to look for other causes of pain. These may include: ? MRI. ? CT scan. ? Bone scan.  Diagnostic injection. A numbing medicine is injected into the SI joint using a needle. If the pain is temporarily improved or stopped after the injection, this can indicate that SI joint dysfunction is the problem.  How is this treated? Treatment may vary depending on the  cause and severity of your condition. Treatment options may include:  Applying ice or heat to the lower back area. This can help to reduce pain and muscle spasms.  Medicines to relieve pain or inflammation or to relax the muscles.  Wearing a back brace (sacroiliac brace) to help support the joint while your back is healing.  Physical therapy to increase muscle strength around the joint and flexibility at the joint. This may also involve learning proper body positions and ways of moving to relieve stress on the joint.  Direct manipulation of the SI joint.  Injections of steroid medicine into the joint in order to reduce pain and swelling.  Radiofrequency ablation to burn away nerves that are carrying pain messages from the joint.  Use of a device that provides electrical stimulation in order to reduce pain at the joint.  Surgery to put in screws and plates that limit or prevent joint motion. This is rare.  Follow these instructions at home:  Rest as needed. Limit your activities as directed by your health care provider.  Take medicines only as directed by your health care provider.  If directed, apply ice to the affected area: ? Put ice in a plastic bag. ? Place a towel between your skin and the bag. ? Leave the ice on for 20 minutes, 2-3 times per day.  Use a heating pad or a moist heat pack as directed by your health care provider.  Exercise as directed by your health care provider or physical therapist.  Keep all follow-up visits   as directed by your health care provider. This is important. Contact a health care provider if:  Your pain is not controlled with medicine.  You have a fever.  You have increasingly severe pain. Get help right away if:  You have weakness, numbness, or tingling in your legs or feet.  You lose control of your bladder or bowel. This information is not intended to replace advice given to you by your health care provider. Make sure you discuss  any questions you have with your health care provider. Document Released: 03/12/2009 Document Revised: 05/21/2016 Document Reviewed: 08/21/2014 Elsevier Interactive Patient Education  2018 Elsevier Inc.  

## 2017-10-13 NOTE — Progress Notes (Signed)
Joe Boyd is a 41 y.o. male here for an acute visit.  History of Present Illness:   Back Pain  This is a new problem. The current episode started in the past 7 days. The problem occurs intermittently. The problem is unchanged. The pain is present in the lumbar spine. The quality of the pain is described as cramping. Radiates to: left groin. The pain is mild. The symptoms are aggravated by bending. Stiffness is present in the morning. Pertinent negatives include no abdominal pain, bladder incontinence, bowel incontinence, dysuria, fever, numbness, pelvic pain, perianal numbness, weakness or weight loss. Risk factors include lack of exercise and poor posture. He has tried analgesics and heat for the symptoms. The treatment provided mild relief.   Diabetes Current symptoms: no polyuria or polydipsia, no chest pain, dyspnea or TIA's, no numbness, tingling or pain in extremities.  Taking medication compliantly without noted sided effects []   YES  []   NO  Episodes of hypoglycemia? []   YES  [x]   NO Maintaining a diabetic diet? [x]   YES  []   NO Trying to exercise on a regular basis? []   YES  [x]   NO  On ACE inhibitor or angiotensin II receptor blocker? [x]   YES  []   NO On Aspirin? [x]   YES  []   NO  Lab Results  Component Value Date   HGBA1C 6.5 10/13/2017    Lab Results  Component Value Date   MICROALBUR 0.8 03/08/2017    Lab Results  Component Value Date   CHOL 128 03/08/2017   HDL 35.40 (L) 03/08/2017   LDLCALC 65 03/08/2017   TRIG 139.0 03/08/2017   CHOLHDL 4 03/08/2017     Wt Readings from Last 3 Encounters:  10/13/17 134 lb 12.8 oz (61.1 kg)  06/15/17 146 lb 9.6 oz (66.5 kg)  04/19/17 147 lb 12.8 oz (67 kg)   BP Readings from Last 3 Encounters:  10/13/17 110/70  06/15/17 122/88  04/19/17 138/78   Lab Results  Component Value Date   CREATININE 1.02 06/09/2017   PMHx, SurgHx, SocialHx, Medications, and Allergies were reviewed in the Visit Navigator and updated as  appropriate.  Current Medications:   Current Outpatient Prescriptions:  .  glucose blood (BAYER CONTOUR TEST) test strip, Test once every 3 days., Disp: 100 each, Rfl: 5 .  KOMBIGLYZE XR 2.04-999 MG TB24, TAKE 2 TABLETS BY MOUTH DAILY, Disp: 180 tablet, Rfl: 0 .  miglitol (GLYSET) 50 MG tablet, 1 before lunch and dinner, Disp: 60 tablet, Rfl: 3 .  Multiple Vitamin (MULTIVITAMIN WITH MINERALS) TABS tablet, Take 1 tablet by mouth daily., Disp: , Rfl:  .  Probiotic Product (PROBIOTIC-10) CHEW, Chew 2 capsules by mouth at bedtime., Disp: , Rfl:  .  ranitidine (ZANTAC) 150 MG tablet, Take 150 mg by mouth daily., Disp: , Rfl:   No Known Allergies   Review of Systems:   Pertinent items are noted in the HPI. Otherwise, ROS is negative.  Vitals:   Vitals:   10/13/17 1543  BP: 110/70  Pulse: 73  Temp: 98.5 F (36.9 C)  TempSrc: Oral  SpO2: 98%  Weight: 134 lb 12.8 oz (61.1 kg)  Height: 5\' 7"  (1.702 m)     Body mass index is 21.11 kg/m.   Physical Exam:   Physical Exam  Constitutional: He is oriented to person, place, and time. He appears well-developed and well-nourished. No distress.  HENT:  Head: Normocephalic and atraumatic.  Right Ear: External ear normal.  Left Ear: External ear  normal.  Nose: Nose normal.  Mouth/Throat: Oropharynx is clear and moist.  Eyes: Pupils are equal, round, and reactive to light. Conjunctivae and EOM are normal.  Neck: Normal range of motion. Neck supple.  Cardiovascular: Normal rate, regular rhythm, normal heart sounds and intact distal pulses.   Pulmonary/Chest: Effort normal and breath sounds normal.  Abdominal: Soft. Bowel sounds are normal.  Musculoskeletal: Normal range of motion.  Neurological: He is alert and oriented to person, place, and time.  Skin: Skin is warm and dry.  Psychiatric: He has a normal mood and affect. His behavior is normal. Judgment and thought content normal.  Nursing note and vitals reviewed.  Results for orders  placed or performed in visit on 10/13/17  Urinalysis, Routine w reflex microscopic  Result Value Ref Range   Color, Urine YELLOW Yellow;Lt. Yellow   APPearance Cloudy (A) Clear   Specific Gravity, Urine >=1.030 (A) 1.000 - 1.030   pH 5.5 5.0 - 8.0   Total Protein, Urine NEGATIVE Negative   Urine Glucose NEGATIVE Negative   Ketones, ur NEGATIVE Negative   Bilirubin Urine NEGATIVE Negative   Hgb urine dipstick NEGATIVE Negative   Urobilinogen, UA 0.2 0.0 - 1.0   Leukocytes, UA NEGATIVE Negative   Nitrite NEGATIVE Negative   WBC, UA 0-2/hpf 0-2/hpf   RBC / HPF none seen 0-2/hpf   Squamous Epithelial / LPF Rare(0-4/hpf) Rare(0-4/hpf)  POCT urinalysis dipstick  Result Value Ref Range   Color, UA Yellow    Clarity, UA Clear    Glucose, UA Negative    Bilirubin, UA Negative    Ketones, UA Negative    Spec Grav, UA >=1.030 (A) 1.010 - 1.025   Blood, UA Negative    pH, UA 6.0 5.0 - 8.0   Protein, UA Negative    Urobilinogen, UA 0.2 0.2 or 1.0 E.U./dL   Nitrite, UA Negative    Leukocytes, UA Negative Negative  POCT glycosylated hemoglobin (Hb A1C)  Result Value Ref Range   Hemoglobin A1C 6.5    EXAM: ABDOMEN - 1 VIEW  COMPARISON:  None.  FINDINGS: Questionable linear calcification projecting over the lower pole the right renal outline. Otherwise, no radiopaque calculi project over the renal outlines, expected course of the ureters or bladder.  IMPRESSION: Questionable right renal stone.  Assessment and Plan:   Diagnoses and all orders for this visit:  Acute left-sided low back pain without sciatica Comments: Lab and XRAY reassuring. Exam most c/w MSK etiology. Orders: -     diclofenac (VOLTAREN) 75 MG EC tablet; Take 1 tablet (75 mg total) by mouth 2 (two) times daily.  Frequency of urination -     POCT urinalysis dipstick -     POCT glycosylated hemoglobin (Hb A1C) -     Urinalysis, Routine w reflex microscopic  Groin pain, left -     DG Abd 1 View -      POCT glycosylated hemoglobin (Hb A1C) -     Urinalysis, Routine w reflex microscopic  Need for immunization against influenza -     Flu Vaccine QUAD 36+ mos IM  Controlled type 2 diabetes mellitus without complication, without long-term current use of insulin (HCC) Comments: Doing well.   . Reviewed expectations re: course of current medical issues. . Discussed self-management of symptoms. . Outlined signs and symptoms indicating need for more acute intervention. . Patient verbalized understanding and all questions were answered. Marland Kitchen. Health Maintenance issues including appropriate healthy diet, exercise, and smoking avoidance were discussed with  patient. . See orders for this visit as documented in the electronic medical record. . Patient received an After Visit Summary.   Joe Rima, DO [facility] 10/23/2017  Future Appointments Date Time Provider Department Center  10/25/2017 3:15 PM Reather Littler, MD [facility] None  04/13/2018 3:40 PM Joe Rima, DO [facility] None

## 2017-10-14 ENCOUNTER — Telehealth: Payer: Self-pay | Admitting: Family Medicine

## 2017-10-14 DIAGNOSIS — Z23 Encounter for immunization: Secondary | ICD-10-CM

## 2017-10-14 DIAGNOSIS — M545 Low back pain: Secondary | ICD-10-CM | POA: Diagnosis not present

## 2017-10-14 LAB — URINALYSIS, ROUTINE W REFLEX MICROSCOPIC
Bilirubin Urine: NEGATIVE
Hgb urine dipstick: NEGATIVE
Ketones, ur: NEGATIVE
Leukocytes, UA: NEGATIVE
Nitrite: NEGATIVE
RBC / HPF: NONE SEEN (ref 0–?)
Specific Gravity, Urine: >=1.03 — AB (ref 1.000–1.030)
Total Protein, Urine: NEGATIVE
Urine Glucose: NEGATIVE
Urobilinogen, UA: 0.2 (ref 0.0–1.0)
pH: 5.5 (ref 5.0–8.0)

## 2017-10-14 NOTE — Telephone Encounter (Signed)
Please call back RE x-ray results.  Ty,  -LL

## 2017-10-15 NOTE — Telephone Encounter (Signed)
calling for x-ray results,  Advised we are waiting for Earlene PlaterWallace to review.  Ty,  -LL

## 2017-10-15 NOTE — Telephone Encounter (Signed)
Patient calling for x-ray results,  Advised on typical call back time frame.  Ty,  -LL

## 2017-10-15 NOTE — Telephone Encounter (Signed)
Please advise 

## 2017-10-19 NOTE — Telephone Encounter (Signed)
Questionable right kidney stone. Not in area of pain. How is pain now? Get description:   Location? Duration? Exacerbation? Radiation? Urine changes? I am willing to order CT stone protocol.

## 2017-10-20 ENCOUNTER — Other Ambulatory Visit: Payer: BLUE CROSS/BLUE SHIELD

## 2017-10-20 DIAGNOSIS — E1165 Type 2 diabetes mellitus with hyperglycemia: Secondary | ICD-10-CM

## 2017-10-20 NOTE — Telephone Encounter (Signed)
Patient stated that the back pain is getting better. The back pain is only every once and awhile. He is not having any pain with urination. He would like to hold off on CT for right now.

## 2017-10-23 ENCOUNTER — Encounter: Payer: Self-pay | Admitting: Family Medicine

## 2017-10-25 ENCOUNTER — Ambulatory Visit: Payer: BLUE CROSS/BLUE SHIELD | Admitting: Family Medicine

## 2017-10-25 ENCOUNTER — Ambulatory Visit (INDEPENDENT_AMBULATORY_CARE_PROVIDER_SITE_OTHER): Payer: BLUE CROSS/BLUE SHIELD | Admitting: Endocrinology

## 2017-10-25 ENCOUNTER — Encounter: Payer: Self-pay | Admitting: Endocrinology

## 2017-10-25 VITALS — BP 110/76 | HR 67 | Ht 67.0 in | Wt 137.6 lb

## 2017-10-25 DIAGNOSIS — E1165 Type 2 diabetes mellitus with hyperglycemia: Secondary | ICD-10-CM

## 2017-10-25 NOTE — Patient Instructions (Addendum)
More sugars after Bfst  Glyset at dinner for large meals

## 2017-10-25 NOTE — Progress Notes (Signed)
Patient ID: Joe Boyd, male   DOB: 05-28-1976, 41 y.o.   MRN: [id number]           Reason for Appointment: Follow-up visit for Type 2 Diabetes  Referring physician: none  History of Present Illness:          Diagnosis: Type 2 diabetes mellitus, date of diagnosis: 2012        Past history:  At time of diagnosis he was having symptoms of increased thirst and urination and he was seen by his physician when he started feeling weak and dizzy also. His A1c at diagnosis was 12.8. He thinks he was treated with metformin only and also he started changing his diet along with eliminating sweet soft drinks.  Initially was treated with only 500 mg of metformin ER and in 2014 this was increased to 1000 mg.  He has not had any other medications for diabetes. He was [place] 07[place]2014 when his A1c was 6.5 and no changes were made A1c of over 7%  in 7/16 and he was started on Kombiglyze XR In 6/17 with his A1c going up to 7.6 he was given Glyset 25 mg at lunch also in addition to Campbell SoupKombiglyze  Recent history:   A1c is 6.5 compared to previous value of 7.3    Current management, blood sugar patterns and problems identified:   He again did not bring his monitor for download  Previously has had only occasional blood sugars at home and he does not remember exact readings  Although he was told to take Lasix at lunch and supper both he says that he is eating only a light meal in the evening and is still taking 2 tablets of 25 mg Glyset at lunch which is late afternoon; no side effects with this  Not clear if he has high readings after breakfast when he is usually eating a sandwich  He also does not know why he is losing weight despite not changing his diet or activity level  However has been trying to walk almost every evening       Oral hypoglycemic drugs the patient is taking are:   Kombiglyze XR 2.04/999, 2 tablets daily, Glyset 50 mg at lunch      Side  effects from medications have been: None Compliance with the medical regimen: Fair   Glucose monitoring:  done occasionally,        Glucometer:  Contour      Blood Glucose readings:  By recall only Am 110 130-140 pc  Self-care:  Meals: 3 meals per day. Breakfast is usually a small sandwich  Tries to get some protein with his lunch, sometimes yogurt, sometimes has chicken.    Usually avoiding snacks          Exercise:   walking 45 minutes at least 5 days a week         Dietician visit, most recent: 7/16                Weight history:   Wt Readings from Last 3 Encounters:  10/25/17 137 lb 9.6 oz (62.4 kg)  10/13/17 134 lb 12.8 oz (61.1 kg)  06/15/17 146 lb 9.6 oz (66.5 kg)    Glycemic control:  A1c in 07/2014 was 6.5  Lab Results  Component Value Date   HGBA1C 6.5 10/13/2017   HGBA1C 7.3 (H) 06/09/2017   HGBA1C 7.2 (H) 03/08/2017   Lab Results  Component Value Date  MICROALBUR 0.8 03/08/2017   LDLCALC 65 03/08/2017   CREATININE 1.02 06/09/2017   No visits with results within 1 Week(s) from this visit.  Latest known visit with results is:  Office Visit on 10/13/2017  Component Date Value Ref Range Status  . Color, UA 10/13/2017 Yellow   Final  . Clarity, UA 10/13/2017 Clear   Final  . Glucose, UA 10/13/2017 Negative   Final  . Bilirubin, UA 10/13/2017 Negative   Final  . Ketones, UA 10/13/2017 Negative   Final  . Spec Grav, UA 10/13/2017 >=1.030* 1.010 - 1.025 Final  . Blood, UA 10/13/2017 Negative   Final  . pH, UA 10/13/2017 6.0  5.0 - 8.0 Final  . Protein, UA 10/13/2017 Negative   Final  . Urobilinogen, UA 10/13/2017 0.2  0.2 or 1.0 E.U./dL Final  . Nitrite, UA 16/09/9603 Negative   Final  . Leukocytes, UA 10/13/2017 Negative  Negative Final  . Hemoglobin A1C 10/13/2017 6.5   Final  . Color, Urine 10/13/2017 YELLOW  Yellow;Lt. Yellow Final  . APPearance 10/13/2017 Cloudy* Clear Final  . Specific Gravity, Urine 10/13/2017 >=1.030* 1.000 - 1.030 Final  . pH  10/13/2017 5.5  5.0 - 8.0 Final  . Total Protein, Urine 10/13/2017 NEGATIVE  Negative Final  . Urine Glucose 10/13/2017 NEGATIVE  Negative Final  . Ketones, ur 10/13/2017 NEGATIVE  Negative Final  . Bilirubin Urine 10/13/2017 NEGATIVE  Negative Final  . Hgb urine dipstick 10/13/2017 NEGATIVE  Negative Final  . Urobilinogen, UA 10/13/2017 0.2  0.0 - 1.0 Final  . Leukocytes, UA 10/13/2017 NEGATIVE  Negative Final  . Nitrite 10/13/2017 NEGATIVE  Negative Final  . WBC, UA 10/13/2017 0-2/hpf  0-2/hpf Final  . RBC / HPF 10/13/2017 none seen  0-2/hpf Final  . Squamous Epithelial / LPF 10/13/2017 Rare(0-4/hpf)  Rare(0-4/hpf) Final      Allergies as of 10/25/2017   No Known Allergies     Medication List       Accurate as of 10/25/17  3:31 PM. Always use your most recent med list.          glucose blood test strip Commonly known as:  BAYER CONTOUR TEST Test once every 3 days.   KOMBIGLYZE XR 2.04-999 MG Tb24 Generic drug:  Saxagliptin-Metformin TAKE 2 TABLETS BY MOUTH DAILY   miglitol 50 MG tablet Commonly known as:  GLYSET 1 before lunch and dinner   multivitamin with minerals Tabs tablet Take 1 tablet by mouth daily.   PROBIOTIC-10 Chew Chew 2 capsules by mouth at bedtime.   ranitidine 150 MG tablet Commonly known as:  ZANTAC Take 150 mg by mouth daily.       Allergies: No Known Allergies  Past Medical History:  Diagnosis Date  . Diabetes mellitus without complication (HCC)   . Tuberculosis    Finished Rx in 8/15    No past surgical history on file.  Family History  Problem Relation Age of Onset  . Diabetes Mother   . Hypertension Mother   . Thyroid disease Mother   . Diabetes Father   . Hypertension Father   . Cancer Neg Hx   . Heart disease Neg Hx     Social History:  reports that he has never smoked. He has never used smokeless tobacco. He reports that he drinks alcohol. He reports that he does not use drugs.    Review of Systems          Lipids:  LDL and triglycerides Have been normal without  medications HDL  low      Lab Results  Component Value Date   CHOL 128 03/08/2017   HDL 35.40 (L) 03/08/2017   LDLCALC 65 03/08/2017   TRIG 139.0 03/08/2017   CHOLHDL 4 03/08/2017                  Thyroid: Has History of a small Euthyroid goiter   Lab Results  Component Value Date   TSH 1.18 05/26/2016     LABS:  No visits with results within 1 Week(s) from this visit.  Latest known visit with results is:  Office Visit on 10/13/2017  Component Date Value Ref Range Status  . Color, UA 10/13/2017 Yellow   Final  . Clarity, UA 10/13/2017 Clear   Final  . Glucose, UA 10/13/2017 Negative   Final  . Bilirubin, UA 10/13/2017 Negative   Final  . Ketones, UA 10/13/2017 Negative   Final  . Spec Grav, UA 10/13/2017 >=1.030* 1.010 - 1.025 Final  . Blood, UA 10/13/2017 Negative   Final  . pH, UA 10/13/2017 6.0  5.0 - 8.0 Final  . Protein, UA 10/13/2017 Negative   Final  . Urobilinogen, UA 10/13/2017 0.2  0.2 or 1.0 E.U./dL Final  . Nitrite, UA 40/98/1191 Negative   Final  . Leukocytes, UA 10/13/2017 Negative  Negative Final  . Hemoglobin A1C 10/13/2017 6.5   Final  . Color, Urine 10/13/2017 YELLOW  Yellow;Lt. Yellow Final  . APPearance 10/13/2017 Cloudy* Clear Final  . Specific Gravity, Urine 10/13/2017 >=1.030* 1.000 - 1.030 Final  . pH 10/13/2017 5.5  5.0 - 8.0 Final  . Total Protein, Urine 10/13/2017 NEGATIVE  Negative Final  . Urine Glucose 10/13/2017 NEGATIVE  Negative Final  . Ketones, ur 10/13/2017 NEGATIVE  Negative Final  . Bilirubin Urine 10/13/2017 NEGATIVE  Negative Final  . Hgb urine dipstick 10/13/2017 NEGATIVE  Negative Final  . Urobilinogen, UA 10/13/2017 0.2  0.0 - 1.0 Final  . Leukocytes, UA 10/13/2017 NEGATIVE  Negative Final  . Nitrite 10/13/2017 NEGATIVE  Negative Final  . WBC, UA 10/13/2017 0-2/hpf  0-2/hpf Final  . RBC / HPF 10/13/2017 none seen  0-2/hpf Final  . Squamous Epithelial / LPF  10/13/2017 Rare(0-4/hpf)  Rare(0-4/hpf) Final    Physical Examination:  BP 110/76   Pulse 67   Ht 5\' 7"  (1.702 m)   Wt 137 lb 9.6 oz (62.4 kg)   SpO2 98%   BMI 21.55 kg/m      ASSESSMENT:  Diabetes type 2 , nonobese See history of present illness for detailed discussion of current diabetes management, blood sugar patterns and problems identified  Currently on Kombiglyze XR and Glyset, The latter once a day  His A1c is much improved at 6.5, previously consistently over 70 However this may be more related to his weight loss which has occurred for no obvious reason Most likely has monitored blood sugars sporadically and did not bring his meter again  Weight loss: He has no change in appetite, no tachycardia to suggest thyroid disease  PLAN:   Start checking blood sugars more after meals including after breakfast and supper  Discussed blood sugar targets at various times  Follow-up with PCP for general care and evaluation of weight loss if continuing  Recheck lipids on next visit   Takesha Steger 10/25/2017, 3:31 PM   Note: This office note was prepared with Insurance underwriter. Any transcriptional errors that result from this process are unintentional.

## 2018-01-02 ENCOUNTER — Other Ambulatory Visit: Payer: Self-pay | Admitting: Endocrinology

## 2018-02-24 ENCOUNTER — Other Ambulatory Visit (INDEPENDENT_AMBULATORY_CARE_PROVIDER_SITE_OTHER): Payer: BLUE CROSS/BLUE SHIELD

## 2018-02-24 DIAGNOSIS — E1165 Type 2 diabetes mellitus with hyperglycemia: Secondary | ICD-10-CM

## 2018-02-24 LAB — COMPREHENSIVE METABOLIC PANEL
ALT: 35 U/L (ref 0–53)
AST: 25 U/L (ref 0–37)
Albumin: 4.4 g/dL (ref 3.5–5.2)
Alkaline Phosphatase: 55 U/L (ref 39–117)
BILIRUBIN TOTAL: 0.6 mg/dL (ref 0.2–1.2)
BUN: 21 mg/dL (ref 6–23)
CALCIUM: 10.5 mg/dL (ref 8.4–10.5)
CHLORIDE: 103 meq/L (ref 96–112)
CO2: 31 meq/L (ref 19–32)
CREATININE: 1.09 mg/dL (ref 0.40–1.50)
GFR: 78.19 mL/min (ref >60.00)
GLUCOSE: 111 mg/dL — AB (ref 70–99)
Potassium: 5.4 mEq/L — ABNORMAL HIGH (ref 3.5–5.1)
SODIUM: 139 meq/L (ref 135–145)
Total Protein: 8.3 g/dL (ref 6.0–8.3)

## 2018-02-24 LAB — LIPID PANEL
CHOL/HDL RATIO: 4
Cholesterol: 152 mg/dL (ref 0–200)
HDL: 41.3 mg/dL (ref >39.00)
LDL Cholesterol: 89 mg/dL (ref 0–99)
NONHDL: 110.28
Triglycerides: 108 mg/dL (ref 0.0–149.0)
VLDL: 21.6 mg/dL (ref 0.0–40.0)

## 2018-02-24 LAB — MICROALBUMIN / CREATININE URINE RATIO
CREATININE, U: 170.1 mg/dL
Microalb Creat Ratio: 0.6 mg/g (ref 0.0–30.0)
Microalb, Ur: 1 mg/dL (ref 0.0–1.9)

## 2018-02-24 LAB — HEMOGLOBIN A1C: HEMOGLOBIN A1C: 6.9 % — AB (ref 4.6–6.5)

## 2018-02-28 ENCOUNTER — Ambulatory Visit: Payer: BLUE CROSS/BLUE SHIELD | Admitting: Endocrinology

## 2018-02-28 ENCOUNTER — Encounter: Payer: Self-pay | Admitting: Endocrinology

## 2018-02-28 VITALS — BP 120/72 | HR 72 | Ht 67.0 in

## 2018-02-28 DIAGNOSIS — E1165 Type 2 diabetes mellitus with hyperglycemia: Secondary | ICD-10-CM | POA: Diagnosis not present

## 2018-02-28 NOTE — Progress Notes (Signed)
Patient ID: Joe Boyd, male   DOB: August 27, 1976, 42 y.o.   MRN: [id number]           Reason for Appointment: Follow-up visit for Type 2 Diabetes  Referring physician: none  History of Present Illness:          Diagnosis: Type 2 diabetes mellitus, date of diagnosis: 2012        Past history:  At time of diagnosis he was having symptoms of increased thirst and urination and he was seen by his physician when he started feeling weak and dizzy also. His A1c at diagnosis was 12.8. He thinks he was treated with metformin only and also he started changing his diet along with eliminating sweet soft drinks.  Initially was treated with only 500 mg of metformin ER and in 2014 this was increased to 1000 mg.  He has not had any other medications for diabetes. He was last seen by [place]ocrin[place] in 07/2014 when his A1c was 6.5 and no changes were made A1c of over 7%  in 7/16 and he was started on Kombiglyze XR In 6/17 with his A1c going up to 7.6 he was given Glyset 25 mg at lunch also in addition to [employer] SoupKombiglyze  Recent history:   A1c is higher at 6.9, previously 6.5     Current management, blood sugar patterns and problems identified:   He again did not bring his monitor for download  He has gained a lot of weight and he thinks this is from lack of exercise  Also tends to have high-fat sandwiches at lunch while working  He has not done any walking which he used to do previously  Also he has he thinks his blood sugars are fairly good the may be up to 180 after dinner  Lab glucose was 111 fasting  He is generally tends to have a higher carbohydrate dinner  He will not take his Glyset at lunchtime unless he is eating at home which is usually not the case       Oral hypoglycemic drugs the patient is taking are:   Kombiglyze XR 2.04/999, 2 tablets daily, Glyset 50 mg at lunch, dinner      Side effects from medications have been: None Compliance with the medical regimen:  Fair   Glucose monitoring:  done occasionally,        Glucometer:  Contour      Blood Glucose readings:  By recall  Am 130  pc 180  Self-care:  Meals: 3 meals per day. Breakfast is usually a small sandwich  Tries to get some protein with his lunch, sometimes yogurt, sometimes has chicken.    Usually avoiding snacks          Exercise: not  walking          Dietician visit, most recent: 7/16                Weight history:   Wt Readings from Last 3 Encounters:  10/25/17 137 lb 9.6 oz (62.4 kg)  10/13/17 134 lb 12.8 oz (61.1 kg)  06/15/17 146 lb 9.6 oz (66.5 kg)    Glycemic control:  A1c in 07/2014 was 6.5  Lab Results  Component Value Date   HGBA1C 6.9 (H) 02/24/2018   HGBA1C 6.5 10/13/2017   HGBA1C 7.3 (H) 06/09/2017   Lab Results  Component Value Date   MICROALBUR 1.0 02/24/2018   LDLCALC 89 02/24/2018   CREATININE 1.09 02/24/2018   Lab  on 02/24/2018  Component Date Value Ref Range Status  . Sodium 02/24/2018 139  135 - 145 mEq/L Final  . Potassium 02/24/2018 5.4* 3.5 - 5.1 mEq/L Final  . Chloride 02/24/2018 103  96 - 112 mEq/L Final  . CO2 02/24/2018 31  19 - 32 mEq/L Final  . Glucose, Bld 02/24/2018 111* 70 - 99 mg/dL Final  . BUN 16/09/9603 21  6 - 23 mg/dL Final  . Creatinine, Ser 02/24/2018 1.09  0.40 - 1.50 mg/dL Final  . Total Bilirubin 02/24/2018 0.6  0.2 - 1.2 mg/dL Final  . Alkaline Phosphatase 02/24/2018 55  39 - 117 U/L Final  . AST 02/24/2018 25  0 - 37 U/L Final  . ALT 02/24/2018 35  0 - 53 U/L Final  . Total Protein 02/24/2018 8.3  6.0 - 8.3 g/dL Final  . Albumin 54/08/8118 4.4  3.5 - 5.2 g/dL Final  . Calcium 14/78/2956 10.5  8.4 - 10.5 mg/dL Final  . GFR 21/30/8657 78.19  >60.00 mL/min Final  . Cholesterol 02/24/2018 152  0 - 200 mg/dL Final   ATP III Classification       Desirable:  < 200 mg/dL               Borderline High:  200 - 239 mg/dL          High:  > = 846 mg/dL  . Triglycerides 02/24/2018 108.0  0.0 - 149.0 mg/dL Final   Normal:   <962 mg/dLBorderline High:  150 - 199 mg/dL  . HDL 02/24/2018 41.30  >39.00 mg/dL Final  . VLDL 95/28/4132 21.6  0.0 - 40.0 mg/dL Final  . LDL Cholesterol 02/24/2018 89  0 - 99 mg/dL Final  . Total CHOL/HDL Ratio 02/24/2018 4   Final                  Men          Women1/2 Average Risk     3.4          3.3Average Risk          5.0          4.42X Average Risk          9.6          7.13X Average Risk          15.0          11.0                      . NonHDL 02/24/2018 110.28   Final   NOTE:  Non-HDL goal should be 30 mg/dL higher than patient's LDL goal (i.e. LDL goal of < 70 mg/dL, would have non-HDL goal of < 100 mg/dL)  . Hgb A1c MFr Bld 02/24/2018 6.9* 4.6 - 6.5 % Final   Glycemic Control Guidelines for People with Diabetes:Non Diabetic:  <6%Goal of Therapy: <7%Additional Action Suggested:  >8%   . Microalb, Ur 02/24/2018 1.0  0.0 - 1.9 mg/dL Final  . Creatinine,U 44/12/270 170.1  mg/dL Final  . Microalb Creat Ratio 02/24/2018 0.6  0.0 - 30.0 mg/g Final      Allergies as of 02/28/2018   No Known Allergies     Medication List        Accurate as of 02/28/18  3:23 PM. Always use your most recent med list.          glucose blood test strip Commonly known as:  BAYER CONTOUR TEST Test  once every 3 days.   KOMBIGLYZE XR 2.04-999 MG Tb24 Generic drug:  Saxagliptin-Metformin TAKE 2 TABLETS BY MOUTH DAILY   miglitol 50 MG tablet Commonly known as:  GLYSET 1 before lunch and dinner   multivitamin with minerals Tabs tablet Take 1 tablet by mouth daily.   PROBIOTIC-10 Chew Chew 2 capsules by mouth at bedtime.   ranitidine 150 MG tablet Commonly known as:  ZANTAC Take 150 mg by mouth daily.       Allergies: No Known Allergies  Past Medical History:  Diagnosis Date  . Diabetes mellitus without complication (HCC)   . Tuberculosis    Finished Rx in 8/15    History reviewed. No pertinent surgical history.  Family History  Problem Relation Age of Onset  . Diabetes Mother    . Hypertension Mother   . Thyroid disease Mother   . Diabetes Father   . Hypertension Father   . Cancer Neg Hx   . Heart disease Neg Hx     Social History:  reports that  has never smoked. he has never used smokeless tobacco. He reports that he drinks alcohol. He reports that he does not use drugs.    Review of Systems         Lipids:  LDL and triglycerides  normal without medications HDL low normal      Lab Results  Component Value Date   CHOL 152 02/24/2018   HDL 41.30 02/24/2018   LDLCALC 89 02/24/2018   TRIG 108.0 02/24/2018   CHOLHDL 4 02/24/2018                  Thyroid: Has History of a small Euthyroid goiter   Lab Results  Component Value Date   TSH 1.18 05/26/2016     LABS:  Lab on 02/24/2018  Component Date Value Ref Range Status  . Sodium 02/24/2018 139  135 - 145 mEq/L Final  . Potassium 02/24/2018 5.4* 3.5 - 5.1 mEq/L Final  . Chloride 02/24/2018 103  96 - 112 mEq/L Final  . CO2 02/24/2018 31  19 - 32 mEq/L Final  . Glucose, Bld 02/24/2018 111* 70 - 99 mg/dL Final  . BUN 16/09/9603 21  6 - 23 mg/dL Final  . Creatinine, Ser 02/24/2018 1.09  0.40 - 1.50 mg/dL Final  . Total Bilirubin 02/24/2018 0.6  0.2 - 1.2 mg/dL Final  . Alkaline Phosphatase 02/24/2018 55  39 - 117 U/L Final  . AST 02/24/2018 25  0 - 37 U/L Final  . ALT 02/24/2018 35  0 - 53 U/L Final  . Total Protein 02/24/2018 8.3  6.0 - 8.3 g/dL Final  . Albumin 54/08/8118 4.4  3.5 - 5.2 g/dL Final  . Calcium 14/78/2956 10.5  8.4 - 10.5 mg/dL Final  . GFR 21/30/8657 78.19  >60.00 mL/min Final  . Cholesterol 02/24/2018 152  0 - 200 mg/dL Final   ATP III Classification       Desirable:  < 200 mg/dL               Borderline High:  200 - 239 mg/dL          High:  > = 846 mg/dL  . Triglycerides 02/24/2018 108.0  0.0 - 149.0 mg/dL Final   Normal:  <962 mg/dLBorderline High:  150 - 199 mg/dL  . HDL 02/24/2018 41.30  >39.00 mg/dL Final  . VLDL 95/28/4132 21.6  0.0 - 40.0 mg/dL Final  . LDL  Cholesterol 02/24/2018 89  0 -  99 mg/dL Final  . Total CHOL/HDL Ratio 02/24/2018 4   Final                  Men          Women1/2 Average Risk     3.4          3.3Average Risk          5.0          4.42X Average Risk          9.6          7.13X Average Risk          15.0          11.0                      . NonHDL 02/24/2018 110.28   Final   NOTE:  Non-HDL goal should be 30 mg/dL higher than patient's LDL goal (i.e. LDL goal of < 70 mg/dL, would have non-HDL goal of < 100 mg/dL)  . Hgb A1c MFr Bld 02/24/2018 6.9* 4.6 - 6.5 % Final   Glycemic Control Guidelines for People with Diabetes:Non Diabetic:  <6%Goal of Therapy: <7%Additional Action Suggested:  >8%   . Microalb, Ur 02/24/2018 1.0  0.0 - 1.9 mg/dL Final  . Creatinine,U 09/81/1914 170.1  mg/dL Final  . 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 Also with these meals he is not taking any Glyset  Most likely has monitored blood sugars sporadically and did not bring his meter again   Dyslipidemia: This is minimal and HDL is above 40, however he does need to improve his diet with reduced saturated fat especially with months meals  PLAN:   Start exercising whether he uses an exercise equipment or walks outside  He does need to start checking blood sugars more after lunch also  He will need to keep some Glyset at work to  take at lunch since he is eating carbohydrate consistently at that time  Checking blood sugars more after meals including after breakfast and supper    Reather Littler 02/28/2018, 3:23 PM   Note: This office note was prepared with Dragon voice recognition system technology. Any transcriptional errors that result from this process are unintentional.

## 2018-02-28 NOTE — Patient Instructions (Signed)
Glyset 1 at lunch also

## 2018-04-13 ENCOUNTER — Ambulatory Visit: Payer: BLUE CROSS/BLUE SHIELD | Admitting: Family Medicine

## 2018-04-13 ENCOUNTER — Encounter: Payer: Self-pay | Admitting: Family Medicine

## 2018-04-13 VITALS — HR 78 | Temp 98.4°F | Ht 67.0 in | Wt 144.2 lb

## 2018-04-13 DIAGNOSIS — E119 Type 2 diabetes mellitus without complications: Secondary | ICD-10-CM

## 2018-04-13 NOTE — Progress Notes (Signed)
Joe Boyd is a 42 y.o. male is here for follow up.  History of Present Illness:   Joe BottomJamie Boyd CMA acting as scribe for Dr. Earlene PlaterWallace.  HPI: Patient comes in today for a follow up. Joe Boyd has no problems or concerns today. Joe Boyd has not had an eye exam in over two years. Declined us putting in a referral. Joe Boyd [facility]. Joe Boyd will have them send us the report.   Health Maintenance Due  Topic Date Due  . FOOT EXAM  03/15/2018  . OPHTHALMOLOGY EXAM  03/24/2018   [facility] 04/19/2017 04/06/2016 07/12/2015  Decreased Interest 0 0 0  Down, Depressed, Hopeless 0 0 0  PHQ - 2 Score 0 0 0   PMHx, SurgHx, SocialHx, FamHx, Medications, and Allergies were reviewed in the Visit Navigator and updated as appropriate.   Patient Active Problem List   Diagnosis Date Noted  . Diabetes mellitus type 2, controlled, without complications (HCC) 02/23/2015   Social History   Tobacco Use  . Smoking status: Never Smoker  . Smokeless tobacco: Never Used  Substance Use Topics  . Alcohol use: Yes    Comment: occasionally  . Drug use: No   Current Medications and Allergies:   .  glucose blood (BAYER CONTOUR TEST) test strip, Test once every 3 days., Disp: 100 each, Rfl: 5 .  KOMBIGLYZE XR 2.04-999 MG TB24, TAKE 2 TABLETS BY MOUTH DAILY, Disp: 180 tablet, Rfl: 1 .  miglitol (GLYSET) 50 MG tablet, 1 before lunch and dinner, Disp: 60 tablet, Rfl: 3 .  Multiple Vitamin (MULTIVITAMIN WITH MINERALS) TABS tablet, Take 1 tablet by mouth daily., Disp: , Rfl:  .  Probiotic Product (PROBIOTIC-10) CHEW, Chew 2 capsules by mouth at bedtime., Disp: , Rfl:  .  ranitidine (ZANTAC) 150 MG tablet, Take 150 mg by mouth daily., Disp: , Rfl:   No Known Allergies   Review of Systems   Pertinent items are noted in the HPI. Otherwise, ROS is negative.  Vitals:   Vitals:   04/13/18 1542  Pulse: 78  Temp: 98.4 F (36.9 C)  TempSrc: Oral  SpO2: 97%  Weight: 144 lb  3.2 oz (65.4 kg)  Height: 5\' 7"  (1.702 m)     Body mass index is 22.58 kg/m.   Physical Exam:   Physical Exam  Constitutional: Joe Boyd is oriented to person, place, and time. Joe Boyd appears well-developed and well-nourished. No distress.  HENT:  Head: Normocephalic and atraumatic.  Right Ear: External ear normal.  Left Ear: External ear normal.  Nose: Nose normal.  Mouth/Throat: Oropharynx is clear and moist.  Eyes: Pupils are equal, round, and reactive to light. Conjunctivae and EOM are normal.  Neck: Normal range of motion. Neck supple.  Cardiovascular: Normal rate, regular rhythm, normal heart sounds and intact distal pulses.  Pulmonary/Chest: Effort normal and breath sounds normal.  Abdominal: Soft. Bowel sounds are normal.  Musculoskeletal: Normal range of motion.  Neurological: Joe Boyd is alert and oriented to person, place, and time.  Skin: Skin is warm and dry.  Psychiatric: Joe Boyd has a normal mood and affect. His behavior is normal. Judgment and thought content normal.  Nursing note and vitals reviewed.   Diabetic Foot Exam - Simple   Simple Foot Form Diabetic Foot exam was performed with the following findings:  Yes 04/18/2018 12:17 PM  Visual Inspection No deformities, no ulcerations, no other skin breakdown bilaterally:  Yes Sensation Testing Pulse Check Posterior Tibialis and  Dorsalis pulse intact bilaterally:  Yes Comments      Cholesterol 152 0 - 200 mg/dL   Triglycerides 161.0 0.0 - 149.0 mg/dL   HDL 96.04 >54.09 mg/dL   VLDL 81.1 0.0 - 91.4 mg/dL   LDL Cholesterol 89 0 - 99 mg/dL   Total CHOL/HDL Ratio 4    NonHDL 110.28   Hemoglobin A1c  Result Value Ref Range   Hgb A1c MFr Bld 6.9 (H) 4.6 - 6.5 %  Microalbumin / creatinine urine ratio  Result Value Ref Range   Microalb, Ur 1.0 0.0 - 1.9 mg/dL   Creatinine,U 782.9 mg/dL   Microalb Creat Ratio 0.6 0.0 - 30.0 mg/g   Assessment and Plan:   Diagnoses and all orders for this visit:  Controlled type 2 diabetes  mellitus without complication, without long-term current use of insulin (HCC) Comments: A1c fairly controlled.  Followed by endocrinology.  Weight fluctuates.  Statin refusal.   . Reviewed expectations re: course of current medical issues. . Discussed self-management of symptoms. . Outlined signs and symptoms indicating need for more acute intervention. . Patient verbalized understanding and all questions were answered. Marland Kitchen Health Maintenance issues including appropriate healthy diet, exercise, and smoking avoidance were discussed with patient. . See orders for this visit as documented in the electronic medical record. . Patient received an After Visit Summary.  Helane Rima, DO [place] [place] 04/18/2018  Future Appointments  Date Time Provider Department Center  05/30/2018  9:30 AM [facility] None  06/01/2018  3:15 PM Reather Littler, MD [facility] None  10/18/2018  4:20 PM Helane Rima, DO [facility]

## 2018-04-18 ENCOUNTER — Encounter: Payer: Self-pay | Admitting: Family Medicine

## 2018-05-30 ENCOUNTER — Other Ambulatory Visit: Payer: BLUE CROSS/BLUE SHIELD

## 2018-05-31 ENCOUNTER — Other Ambulatory Visit (INDEPENDENT_AMBULATORY_CARE_PROVIDER_SITE_OTHER): Payer: BLUE CROSS/BLUE SHIELD

## 2018-05-31 DIAGNOSIS — E1165 Type 2 diabetes mellitus with hyperglycemia: Secondary | ICD-10-CM | POA: Diagnosis not present

## 2018-05-31 LAB — BASIC METABOLIC PANEL
BUN: 14 mg/dL (ref 6–23)
CHLORIDE: 102 meq/L (ref 96–112)
CO2: 30 mEq/L (ref 19–32)
Calcium: 9.5 mg/dL (ref 8.4–10.5)
Creatinine, Ser: 0.96 mg/dL (ref 0.40–1.50)
GFR: 90.42 mL/min (ref >60.00)
Glucose, Bld: 130 mg/dL — ABNORMAL HIGH (ref 70–99)
POTASSIUM: 4.7 meq/L (ref 3.5–5.1)
SODIUM: 139 meq/L (ref 135–145)

## 2018-05-31 LAB — HEMOGLOBIN A1C: HEMOGLOBIN A1C: 7.2 % — AB (ref 4.6–6.5)

## 2018-06-01 ENCOUNTER — Ambulatory Visit: Payer: BLUE CROSS/BLUE SHIELD | Admitting: Endocrinology

## 2018-06-01 ENCOUNTER — Encounter: Payer: Self-pay | Admitting: Endocrinology

## 2018-06-01 VITALS — BP 120/80 | HR 71 | Ht 67.0 in | Wt 145.2 lb

## 2018-06-01 DIAGNOSIS — E1165 Type 2 diabetes mellitus with hyperglycemia: Secondary | ICD-10-CM | POA: Diagnosis not present

## 2018-06-01 LAB — GLUCOSE, POCT (MANUAL RESULT ENTRY): POC GLUCOSE: 101 mg/dL — AB (ref 70–99)

## 2018-06-01 NOTE — Progress Notes (Signed)
Patient ID: Joe Boyd, male   DOB: 1976/08/13, 42 y.o.   MRN: [id number]           Reason for Appointment: Follow-up visit for Type 2 Diabetes  Referring physician: none  History of Present Illness:          Diagnosis: Type 2 diabetes mellitus, date of diagnosis: 2012        Past history:  At time of diagnosis he was having symptoms of increased thirst and urination and he was seen by his physician when he started feeling weak and dizzy also. His A1c at diagnosis was 12.8. He thinks he was treated with metformin only and also he started changing his diet along with eliminating sweet soft drinks.  Initially was treated with only 500 mg of metformin ER and in 2014 this was increased to 1000 mg.  He has not had any other medications for diabetes. He [place] when his A1c was 6.5 and no changes were made A1c of over 7%  in 7/16 and he was started on Kombiglyze XR In 6/17 with his A1c going up to 7.6 he was given Glyset 25 mg at lunch also in addition to [employer] SoupKombiglyze  Recent history:   A1c is higher at 7.2   Current management, blood sugar patterns and problems identified:  His blood sugar only twice in the last month fasting was 142 and after lunch 3  Although he has not gained any more weight has not exercised consistently and only in the last week or so has started doing some walking  Late breakfast [employer] shop relatively high fat sandwich or wrap  He has been told to take Glyset with every meal at least at lunch and dinner but he only seems to take it at lunchtime, no side effects from this  His evening meal is regularly lighter but still has carbohydrate  No readings after supper  He has a 90-day supply of Kombiglyze and does not want to change at this time       Oral hypoglycemic drugs the patient is taking are:   Kombiglyze XR 2.04/999, 2 tablets daily, Glyset 50 mg at lunch mostly      Side effects from  medications have been: None Compliance with the medical regimen: Fair   Glucose monitoring:  done occasionally,        Glucometer:  Contour      Blood Glucose readings: As above   Self-care:  Meals: 3 meals per day. Breakfast is usually a small sandwich  Tries to get some protein with his lunch, sometimes yogurt, sometimes has chicken.    Usually avoiding snacks          Exercise: now  walking in the evening for the last week         Dietician visit, most recent: 7/16                Weight history:   Wt Readings from Last 3 Encounters:  06/01/18 145 lb 3.2 oz (65.9 kg)  04/13/18 144 lb 3.2 oz (65.4 kg)  10/25/17 137 lb 9.6 oz (62.4 kg)    Glycemic control:   Lab Results  Component Value Date   HGBA1C 7.2 (H) 05/31/2018   HGBA1C 6.9 (H) 02/24/2018   HGBA1C 6.5 10/13/2017   Lab Results  Component Value Date   MICROALBUR 1.0 02/24/2018   LDLCALC 89 02/24/2018   CREATININE 0.96 05/31/2018   Office  Visit on 06/01/2018  Component Date Value Ref Range Status  . POC Glucose 06/01/2018 101* 70 - 99 mg/dl Final  Lab on 69/62/9528  Component Date Value Ref Range Status  . Sodium 05/31/2018 139  135 - 145 mEq/L Final  . Potassium 05/31/2018 4.7  3.5 - 5.1 mEq/L Final  . Chloride 05/31/2018 102  96 - 112 mEq/L Final  . CO2 05/31/2018 30  19 - 32 mEq/L Final  . Glucose, Bld 05/31/2018 130* 70 - 99 mg/dL Final  . BUN 41/32/4401 14  6 - 23 mg/dL Final  . Creatinine, Ser 05/31/2018 0.96  0.40 - 1.50 mg/dL Final  . Calcium 02/72/5366 9.5  8.4 - 10.5 mg/dL Final  . GFR 44/02/4741 90.42  >60.00 mL/min Final  . Hgb A1c MFr Bld 05/31/2018 7.2* 4.6 - 6.5 % Final   Glycemic Control Guidelines for People with Diabetes:Non Diabetic:  <6%Goal of Therapy: <7%Additional Action Suggested:  >8%       Allergies as of 06/01/2018   No Known Allergies     Medication List        Accurate as of 06/01/18  3:22 PM. Always use your most recent med list.          glucose blood test  strip Commonly known as:  BAYER CONTOUR TEST Test once every 3 days.   KOMBIGLYZE XR 2.04-999 MG Tb24 Generic drug:  Saxagliptin-Metformin TAKE 2 TABLETS BY MOUTH DAILY   miglitol 50 MG tablet Commonly known as:  GLYSET 1 before lunch and dinner   multivitamin with minerals Tabs tablet Take 1 tablet by mouth daily.   PROBIOTIC-10 Chew Chew 2 capsules by mouth at bedtime.   ranitidine 150 MG tablet Commonly known as:  ZANTAC Take 150 mg by mouth daily.       Allergies: No Known Allergies  Past Medical History:  Diagnosis Date  . Diabetes mellitus without complication (HCC)   . Tuberculosis    Finished Rx in 8/15    History reviewed. No pertinent surgical history.  Family History  Problem Relation Age of Onset  . Diabetes Mother   . Hypertension Mother   . Thyroid disease Mother   . Diabetes Father   . Hypertension Father   . Cancer Neg Hx   . Heart disease Neg Hx     Social History:  reports that he has never smoked. He has never used smokeless tobacco. He reports that he drinks alcohol. He reports that he does not use drugs.    Review of Systems    He is asking about a chronic generalized rash especially on his upper extremities and chest has not resolution with OTC Tinactin       Lipids:  LDL and triglycerides  normal without medications HDL low normal 41      Lab Results  Component Value Date   CHOL 152 02/24/2018   HDL 41.30 02/24/2018   LDLCALC 89 02/24/2018   TRIG 108.0 02/24/2018   CHOLHDL 4 02/24/2018                  Thyroid: Has History of a small Euthyroid goiter   Lab Results  Component Value Date   TSH 1.18 05/26/2016     LABS:  Office Visit on 06/01/2018  Component Date Value Ref Range Status  . POC Glucose 06/01/2018 101* 70 - 99 mg/dl Final  Lab on 59/56/3875  Component Date Value Ref Range Status  . Sodium 05/31/2018 139  135 - 145 mEq/L  Final  . Potassium 05/31/2018 4.7  3.5 - 5.1 mEq/L Final  . Chloride 05/31/2018  102  96 - 112 mEq/L Final  . CO2 05/31/2018 30  19 - 32 mEq/L Final  . Glucose, Bld 05/31/2018 130* 70 - 99 mg/dL Final  . BUN 16/09/9603 14  6 - 23 mg/dL Final  . Creatinine, Ser 05/31/2018 0.96  0.40 - 1.50 mg/dL Final  . Calcium 54/08/8118 9.5  8.4 - 10.5 mg/dL Final  . GFR 14/78/2956 90.42  >60.00 mL/min Final  . Hgb A1c MFr Bld 05/31/2018 7.2* 4.6 - 6.5 % Final   Glycemic Control Guidelines for People with Diabetes:Non Diabetic:  <6%Goal of Therapy: <7%Additional Action Suggested:  >8%     Physical Examination:  BP 120/80 (BP Location: Left Arm, Patient Position: Sitting, Cuff Size: Normal)   Pulse 71   Ht 5\' 7"  (1.702 m)   Wt 145 lb 3.2 oz (65.9 kg)   SpO2 98%   BMI 22.74 kg/m    He has a macular patchy slightly scaly and nearly skin colored rash on his extremities the upper arms  ASSESSMENT:  Diabetes type 2 , nonobese See history of present illness for detailed discussion of current diabetes management, blood sugar patterns and problems identified  He is on Kombiglyze XR and Glyset, with glasses only once a day  As discussed above his A1c is going up progressively now 7.2 He is not aware of his blood sugar readings since he has done it only twice in the last month He has only recently started walking for exercise He does have some carbohydrates with all meals and does not take his Glyset or check his sugars after meals He may be a good candidate for Invokana but he does not want to change Kombiglyze since he has a supply at home  Pityriasis versicolor on extremities   PLAN:   Need to start walking regularly to new  More readings after lunch and dinner  He will need to take Glyset with every meal for now  Discussed that if he has any GI side effects he can cut it down to a half a tablet temporarily  Still changing his Kombiglyze if blood sugars are not improved  For his skin rash he will try Nizoral   Reather Littler 06/01/2018, 3:22 PM   Note: This office  note was prepared with Dragon voice recognition system technology. Any transcriptional errors that result from this process are unintentional.

## 2018-06-01 NOTE — Patient Instructions (Addendum)
Check blood sugars on waking up  2-3/7  Also check blood sugars about 2 hours after a meal and do this after different meals by rotation  Recommended blood sugar levels on waking up is 90-130 and about 2 hours after meal is 130-160  Please bring your blood sugar monitor to each visit, thank you  Miglitol with all 3 meals  Walk daily

## 2018-06-02 MED ORDER — KETOCONAZOLE 2 % EX CREA: 1.0000 "application " | TOPICAL_CREAM | Freq: Every day | CUTANEOUS | 0 refills | Status: DC

## 2018-08-24 ENCOUNTER — Other Ambulatory Visit (INDEPENDENT_AMBULATORY_CARE_PROVIDER_SITE_OTHER): Payer: BLUE CROSS/BLUE SHIELD

## 2018-08-24 DIAGNOSIS — E1165 Type 2 diabetes mellitus with hyperglycemia: Secondary | ICD-10-CM

## 2018-08-24 LAB — BASIC METABOLIC PANEL
BUN: 13 mg/dL (ref 6–23)
CHLORIDE: 103 meq/L (ref 96–112)
CO2: 29 meq/L (ref 19–32)
CREATININE: 1.03 mg/dL (ref 0.40–1.50)
Calcium: 9.7 mg/dL (ref 8.4–10.5)
GFR: 83.28 mL/min (ref >60.00)
Glucose, Bld: 114 mg/dL — ABNORMAL HIGH (ref 70–99)
POTASSIUM: 4.5 meq/L (ref 3.5–5.1)
Sodium: 140 mEq/L (ref 135–145)

## 2018-08-24 LAB — HEMOGLOBIN A1C: HEMOGLOBIN A1C: 7.2 % — AB (ref 4.6–6.5)

## 2018-09-01 ENCOUNTER — Ambulatory Visit: Payer: BLUE CROSS/BLUE SHIELD | Admitting: Endocrinology

## 2018-09-01 ENCOUNTER — Encounter: Payer: Self-pay | Admitting: Endocrinology

## 2018-09-01 VITALS — BP 124/82 | HR 69 | Ht 68.0 in | Wt 138.0 lb

## 2018-09-01 DIAGNOSIS — E1165 Type 2 diabetes mellitus with hyperglycemia: Secondary | ICD-10-CM

## 2018-09-01 NOTE — Progress Notes (Signed)
Patient ID: Joe Boyd, male   DOB: 11-23-1976, 42 y.o.   MRN: [id number]           Reason for Appointment: Follow-up visit for Type 2 Diabetes  Referring physician: none  History of Present Illness:          Diagnosis: Type 2 diabetes mellitus, date of diagnosis: 2012        Past history:  At time of diagnosis he was having symptoms of increased thirst and urination and he was seen by his physician when he started feeling weak and dizzy also. His A1c at diagnosis was 12.8. He thinks he was treated with metformin only and also he started changing his diet along with eliminating sweet soft drinks.  Initially was treated with only 500 mg of metformin ER and in 2014 this was increased to 1000 mg.  He has not had any other medications for diabetes. He was last seen by his endocrinologist in [place] in 07/2014 when his A1c was 6.5 and no changes were made A1c of over 7%  in 7/16 and he was started on Kombiglyze XR In 6/17 with his A1c going up to 7.6 he was given Glyset 25 mg at lunch also in addition to [employer]  Recent history:   A1c is same at 7.2   Current management, blood sugar patterns and problems identified:  Again has not brought his monitor for download  However likely to be checking only very sporadically with occasional readings in the mornings and occasionally after evening meal  Previously was eating high fat sandwiches at work for his lunch but he is trying to now eat more home cooked food more often  However even despite reminders he is not taking any Glyset for his lunch which does contain carbohydrate consistently  He thinks his blood sugars after his evening meal which is relatively lighter may be no more than 140  Discussed that his A1c indicates higher readings than what he is measuring at home  He says that because of long working hours and no off days he is not finding the time to exercise  He has lost weight which he thinks may be related to long  working hours and stress       Oral hypoglycemic drugs the patient is taking are:   Kombiglyze XR 2.04/999, 2 tablets daily, Glyset 50 mg at lunch mostly      Side effects from medications have been: None Compliance with the medical regimen: Fair   Glucose monitoring:  done occasionally,        Glucometer:  Contour      Blood Glucose readings: Recent range 104-140   Self-care:  Meals: 3 meals per day. Breakfast is usually a small sandwich or wrap with egg and cheese Tries to get some protein with his lunch, sometimes yogurt, sometimes has chicken.    Usually avoiding snacks          Exercise: not  walking in the evening for the last few weeks         Dietician visit, most recent: 7/16                Weight history:   Wt Readings from Last 3 Encounters:  09/01/18 138 lb (62.6 kg)  06/01/18 145 lb 3.2 oz (65.9 kg)  04/13/18 144 lb 3.2 oz (65.4 kg)    Glycemic control:   Lab Results  Component Value Date   HGBA1C 7.2 (H) 08/24/2018   HGBA1C 7.2 (H)  05/31/2018   HGBA1C 6.9 (H) 02/24/2018   Lab Results  Component Value Date   MICROALBUR 1.0 02/24/2018   LDLCALC 89 02/24/2018   CREATININE 1.03 08/24/2018   No visits with results within 1 Week(s) from this visit.  Latest known visit with results is:  Lab on 08/24/2018  Component Date Value Ref Range Status  . Sodium 08/24/2018 140  135 - 145 mEq/L Final  . Potassium 08/24/2018 4.5  3.5 - 5.1 mEq/L Final  . Chloride 08/24/2018 103  96 - 112 mEq/L Final  . CO2 08/24/2018 29  19 - 32 mEq/L Final  . Glucose, Bld 08/24/2018 114* 70 - 99 mg/dL Final  . BUN 16/09/9603 13  6 - 23 mg/dL Final  . Creatinine, Ser 08/24/2018 1.03  0.40 - 1.50 mg/dL Final  . Calcium 54/08/8118 9.7  8.4 - 10.5 mg/dL Final  . GFR 14/78/2956 83.28  >60.00 mL/min Final  . Hgb A1c MFr Bld 08/24/2018 7.2* 4.6 - 6.5 % Final   Glycemic Control Guidelines for People with Diabetes:Non Diabetic:  <6%Goal of Therapy: <7%Additional Action Suggested:  >8%        Allergies as of 09/01/2018   No Known Allergies     Medication List        Accurate as of 09/01/18  3:39 PM. Always use your most recent med list.          glucose blood test strip Test once every 3 days.   ketoconazole 2 % cream Commonly known as:  NIZORAL Apply 1 application topically daily. Apply on affected areas   KOMBIGLYZE XR 2.04-999 MG Tb24 Generic drug:  Saxagliptin-Metformin TAKE 2 TABLETS BY MOUTH DAILY   miglitol 50 MG tablet Commonly known as:  GLYSET 1 before lunch and dinner   multivitamin with minerals Tabs tablet Take 1 tablet by mouth daily.   PROBIOTIC-10 Chew Chew 2 capsules by mouth at bedtime.   ranitidine 150 MG tablet Commonly known as:  ZANTAC Take 150 mg by mouth daily.       Allergies: No Known Allergies  Past Medical History:  Diagnosis Date  . Diabetes mellitus without complication (HCC)   . Tuberculosis    Finished Rx in 8/15    No past surgical history on file.  Family History  Problem Relation Age of Onset  . Diabetes Mother   . Hypertension Mother   . Thyroid disease Mother   . Diabetes Father   . Hypertension Father   . Cancer Neg Hx   . Heart disease Neg Hx     Social History:  reports that he has never smoked. He has never used smokeless tobacco. He reports that he drinks alcohol. He reports that he does not use drugs.    Review of Systems    He had tinea versicolor on his extremities and this resolved with using Nizoral cream      Lipids:  LDL and triglycerides  normal without medications HDL low normal 41      Lab Results  Component Value Date   CHOL 152 02/24/2018   HDL 41.30 02/24/2018   LDLCALC 89 02/24/2018   TRIG 108.0 02/24/2018   CHOLHDL 4 02/24/2018                  Thyroid: Has History of a small Euthyroid goiter   Lab Results  Component Value Date   TSH 1.18 05/26/2016     LABS:  No visits with results within 1 Week(s) from this visit.  Latest known visit with results  is:  Lab on 08/24/2018  Component Date Value Ref Range Status  . Sodium 08/24/2018 140  135 - 145 mEq/L Final  . Potassium 08/24/2018 4.5  3.5 - 5.1 mEq/L Final  . Chloride 08/24/2018 103  96 - 112 mEq/L Final  . CO2 08/24/2018 29  19 - 32 mEq/L Final  . Glucose, Bld 08/24/2018 114* 70 - 99 mg/dL Final  . BUN 69/62/9528 13  6 - 23 mg/dL Final  . Creatinine, Ser 08/24/2018 1.03  0.40 - 1.50 mg/dL Final  . Calcium 41/32/4401 9.7  8.4 - 10.5 mg/dL Final  . GFR 02/72/5366 83.28  >60.00 mL/min Final  . Hgb A1c MFr Bld 08/24/2018 7.2* 4.6 - 6.5 % Final   Glycemic Control Guidelines for People with Diabetes:Non Diabetic:  <6%Goal of Therapy: <7%Additional Action Suggested:  >8%     Physical Examination:  BP 124/82 (BP Location: Right Arm, Patient Position: Sitting, Cuff Size: Normal)   Pulse 69   Ht 5\' 8"  (1.727 m)   Wt 138 lb (62.6 kg)   SpO2 98%   BMI 20.98 kg/m     ASSESSMENT:  Diabetes type 2 , nonobese See history of present illness for detailed discussion of current diabetes management, blood sugar patterns and problems identified  He is on Kombiglyze XR and Glyset  As discussed above his A1c is still higher than usual at 7.2 Most likely this is from postprandial hyperglycemia This is related to either relatively higher fat or carbohydrate meals, recently not exercising and also not taking Glyset for his lunch meal and only at suppertime Although he thinks he is usually eating 2 meals a day may sometimes have 3  LIPIDS: Needs follow-up on the next visit  PLAN:   He will take Glyset with every meal  Try to check consistently after meals especially lunch  Bring monitor for download on each visit  Start walking or other exercise  Discussed A1c goal of at least under 7     Joe Boyd 09/01/2018, 3:39 PM   Note: This office note was prepared with Dragon voice recognition system technology. Any transcriptional errors that result from this process are  unintentional.

## 2018-09-01 NOTE — Patient Instructions (Addendum)
Check blood sugars on waking up  1/7  Also check blood sugars about 2 hours after a meal and do this after different meals by rotation  Recommended blood sugar levels on waking up is 90-130 and about 2 hours after meal is 130-160  Please bring your blood sugar monitor to each visit, thank you  Take Miglitol pill to work

## 2018-09-13 ENCOUNTER — Other Ambulatory Visit: Payer: Self-pay | Admitting: Endocrinology

## 2018-10-14 DIAGNOSIS — H2512 Age-related nuclear cataract, left eye: Secondary | ICD-10-CM | POA: Diagnosis not present

## 2018-10-14 DIAGNOSIS — H2511 Age-related nuclear cataract, right eye: Secondary | ICD-10-CM | POA: Diagnosis not present

## 2018-10-14 DIAGNOSIS — H43822 Vitreomacular adhesion, left eye: Secondary | ICD-10-CM | POA: Diagnosis not present

## 2018-10-14 DIAGNOSIS — E119 Type 2 diabetes mellitus without complications: Secondary | ICD-10-CM | POA: Diagnosis not present

## 2018-10-14 DIAGNOSIS — H43821 Vitreomacular adhesion, right eye: Secondary | ICD-10-CM | POA: Diagnosis not present

## 2018-10-18 ENCOUNTER — Ambulatory Visit: Payer: BLUE CROSS/BLUE SHIELD | Admitting: Family Medicine

## 2018-10-19 ENCOUNTER — Encounter: Payer: Self-pay | Admitting: Family Medicine

## 2018-10-19 ENCOUNTER — Ambulatory Visit: Payer: BLUE CROSS/BLUE SHIELD | Admitting: Family Medicine

## 2018-10-19 VITALS — BP 118/76 | HR 70 | Temp 98.1°F | Ht 68.0 in | Wt 135.8 lb

## 2018-10-19 DIAGNOSIS — Z5320 Procedure and treatment not carried out because of patient's decision for unspecified reasons: Secondary | ICD-10-CM

## 2018-10-19 DIAGNOSIS — Z5329 Procedure and treatment not carried out because of patient's decision for other reasons: Secondary | ICD-10-CM | POA: Diagnosis not present

## 2018-10-19 DIAGNOSIS — E1165 Type 2 diabetes mellitus with hyperglycemia: Secondary | ICD-10-CM

## 2018-10-19 DIAGNOSIS — Z23 Encounter for immunization: Secondary | ICD-10-CM | POA: Diagnosis not present

## 2018-10-19 NOTE — Progress Notes (Signed)
Joe Boyd is a 42 y.o. male is here for follow up.  History of Present Illness:   HPI:   Health Maintenance Due  Topic Date Due  . OPHTHALMOLOGY EXAM  03/24/2018   Depression screen PHQ 2/9 10/20/2018 04/19/2017 04/06/2016  Decreased Interest 0 0 0  Down, Depressed, Hopeless 0 0 0  PHQ - 2 Score 0 0 0   PMHx, SurgHx, SocialHx, FamHx, Medications, and Allergies were reviewed in the Visit Navigator and updated as appropriate.   Patient Active Problem List   Diagnosis Date Noted  . Diabetes mellitus type 2, controlled, without complications (HCC) 02/23/2015   Social History   Tobacco Use  . Smoking status: Never Smoker  . Smokeless tobacco: Never Used  Substance Use Topics  . Alcohol use: Yes    Comment: occasionally  . Drug use: No   Current Medications and Allergies:   .  glucose blood (BAYER CONTOUR TEST) test strip, Test once every 3 days., Disp: 100 each, Rfl: 5 .  KOMBIGLYZE XR 2.04-999 MG TB24, TAKE 2 TABLETS BY MOUTH DAILY, Disp: 180 tablet, Rfl: 1 .  miglitol (GLYSET) 50 MG tablet, 1 before lunch and dinner (Patient taking differently: 1 before lunch and dinner), Disp: 60 tablet, Rfl: 3 .  Multiple Vitamin (MULTIVITAMIN WITH MINERALS) TABS tablet, Take 1 tablet by mouth daily., Disp: , Rfl:  .  Probiotic Product (PROBIOTIC-10) CHEW, Chew 2 capsules by mouth at bedtime., Disp: , Rfl:   No Known Allergies   Review of Systems   Pertinent items are noted in the HPI. Otherwise, ROS is negative.  Vitals:   Vitals:   10/19/18 1405  BP: 118/76  Pulse: 70  Temp: 98.1 F (36.7 C)  TempSrc: Oral  SpO2: 99%  Weight: 135 lb 12.8 oz (61.6 kg)  Height: 5\' 8"  (1.727 m)     Body mass index is 20.65 kg/m.  Physical Exam:   Physical Exam  Constitutional: He is oriented to person, place, and time. He appears well-developed and well-nourished. No distress.  HENT:  Head: Normocephalic and atraumatic.  Right Ear: External ear normal.  Left Ear: External ear  normal.  Nose: Nose normal.  Mouth/Throat: Oropharynx is clear and moist.  Eyes: Pupils are equal, round, and reactive to light. Conjunctivae and EOM are normal.  Neck: Normal range of motion. Neck supple.  Cardiovascular: Normal rate, regular rhythm, normal heart sounds and intact distal pulses.  Pulmonary/Chest: Effort normal and breath sounds normal.  Abdominal: Soft. Bowel sounds are normal.  Musculoskeletal: Normal range of motion.  Neurological: He is alert and oriented to person, place, and time.  Skin: Skin is warm and dry.  Psychiatric: He has a normal mood and affect. His behavior is normal. Judgment and thought content normal.  Nursing note and vitals reviewed.  Diabetic Foot Exam - Simple   Simple Foot Form Diabetic Foot exam was performed with the following findings:  Yes 10/20/2018  7:28 AM  Visual Inspection No deformities, no ulcerations, no other skin breakdown bilaterally:  Yes Sensation Testing Intact to touch and monofilament testing bilaterally:  Yes Pulse Check Posterior Tibialis and Dorsalis pulse intact bilaterally:  Yes Comments     Results for orders placed or performed in visit on 08/24/18  Basic metabolic panel  Result Value Ref Range   Sodium 140 135 - 145 mEq/L   Potassium 4.5 3.5 - 5.1 mEq/L   Chloride 103 96 - 112 mEq/L   CO2 29 19 - 32 mEq/L  Glucose, Bld 114 (H) 70 - 99 mg/dL   BUN 13 6 - 23 mg/dL   Creatinine, Ser 1.61 0.40 - 1.50 mg/dL   Calcium 9.7 8.4 - 09.6 mg/dL   GFR 04.54 >09.81 mL/min  Hemoglobin A1c  Result Value Ref Range   Hgb A1c MFr Bld 7.2 (H) 4.6 - 6.5 %   Lab Results  Component Value Date   CHOL 152 02/24/2018   CHOL 128 03/08/2017   CHOL 146 05/26/2016   Lab Results  Component Value Date   HDL 41.30 02/24/2018   HDL 35.40 (L) 03/08/2017   HDL 38.30 (L) 05/26/2016   Lab Results  Component Value Date   LDLCALC 89 02/24/2018   LDLCALC 65 03/08/2017   LDLCALC 70 05/26/2016   Lab Results  Component Value Date    TRIG 108.0 02/24/2018   TRIG 139.0 03/08/2017   TRIG 192.0 (H) 05/26/2016   Lab Results  Component Value Date   CHOLHDL 4 02/24/2018   CHOLHDL 4 03/08/2017   CHOLHDL 4 05/26/2016   Assessment and Plan:   Type 2 diabetes mellitus with hyperglycemia (HCC) Diabetic ROS - medication compliance: compliant all of the time, diabetic diet compliance: compliant most of the time, home glucose monitoring: is performed sporadically, last eye exam approximately 1 day ago. Will need to request records.  New concerns: None.   Diabetic exam: feet: warm, good capillary refill, normal DP and PT pulses and normal sensory exam.  Lab review: labs are reviewed, up to date and normal.  Assessment: Diabetes Mellitus: stable.  Plan: See orders for this visit as documented in the electronic medical record. Diabetic issues reviewed with him: low cholesterol diet, weight control and daily exercise discussed, home glucose monitoring emphasized, diabetic Sick Day rules reviewed, handout given, annual eye examinations at Ophthalmology discussed and long term diabetic complications discussed.  1. Type 2 diabetes mellitus with hyperglycemia, without long-term current use of insulin (HCC)  2. Need for influenza vaccination - Flu Vaccine QUAD 6+ mos PF IM (Fluarix Quad PF)  3. Refusal of statin medication by patient  . Reviewed expectations re: course of current medical issues. . Discussed self-management of symptoms. . Outlined signs and symptoms indicating need for more acute intervention. . Patient verbalized understanding and all questions were answered. Marland [facility] Health Maintenance issues including appropriate healthy diet, exercise, and smoking avoidance were discussed with patient. . See orders for this visit as documented in the electronic medical record. . Patient received an After Visit Summary.  Helane Rima, DO [place] 10/20/2018

## 2018-10-20 DIAGNOSIS — Z5329 Procedure and treatment not carried out because of patient's decision for other reasons: Secondary | ICD-10-CM | POA: Insufficient documentation

## 2018-10-20 DIAGNOSIS — Z5320 Procedure and treatment not carried out because of patient's decision for unspecified reasons: Secondary | ICD-10-CM | POA: Insufficient documentation

## 2018-10-20 NOTE — Assessment & Plan Note (Signed)
Diabetic ROS - medication compliance: compliant all of the time, diabetic diet compliance: compliant most of the time, home glucose monitoring: is performed sporadically, last eye exam approximately 1 day ago. Will need to request records.  New concerns: None.   Diabetic exam: feet: warm, good capillary refill, normal DP and PT pulses and normal sensory exam.  Lab review: labs are reviewed, up to date and normal.  Assessment: Diabetes Mellitus: stable.  Plan: See orders for this visit as documented in the electronic medical record. Diabetic issues reviewed with him: low cholesterol diet, weight control and daily exercise discussed, home glucose monitoring emphasized, diabetic Sick Day rules reviewed, handout given, annual eye examinations at Ophthalmology discussed and long term diabetic complications discussed.

## 2018-11-10 ENCOUNTER — Encounter: Payer: Self-pay | Admitting: Family Medicine

## 2018-11-10 ENCOUNTER — Ambulatory Visit: Payer: BLUE CROSS/BLUE SHIELD | Admitting: Family Medicine

## 2018-11-10 VITALS — BP 116/78 | HR 60 | Temp 98.2°F | Ht 68.0 in | Wt 134.8 lb

## 2018-11-10 DIAGNOSIS — M545 Low back pain, unspecified: Secondary | ICD-10-CM

## 2018-11-10 DIAGNOSIS — M79645 Pain in left finger(s): Secondary | ICD-10-CM

## 2018-11-10 MED ORDER — IBUPROFEN 600 MG PO TABS: 600.0000 mg | ORAL_TABLET | Freq: Three times a day (TID) | ORAL | 0 refills | Status: DC | PRN

## 2018-11-10 MED ORDER — KETOROLAC TROMETHAMINE 60 MG/2ML IM SOLN
60.0000 mg | Freq: Once | INTRAMUSCULAR | Status: AC
  Administered 2018-11-10: 60 mg via INTRAMUSCULAR

## 2018-11-10 NOTE — Progress Notes (Signed)
Patient: Joe Boyd MRN: [id number] DOB: May 06, 1976 PCP: Helane RimaWallace, Erica, DO     Subjective:  Chief Complaint  Patient presents with  . Back Pain  . Hand Pain    HPI: The patient is a 42 y.o. male who presents today for right sided lumbar pain.  Pain started about 6 days ago. He states it just started. It is more noticeable if he does a twisting motion. He is unaware of any precipitating factors. No trauma or changed/increased exercise or work out. Never had any issues with his hip. He is on his feet all day. Does a lot of pivoting. He has taken tylenol unsure if it really helped. Walking doesn't seem to bother him and stairs doesn't seem to bother him. Mainly twisting motion and laying down and getting up. He has no radiation of the pain down his buttocks, leg. No burning, loss of sensation, weakness or other radicular symptoms. Pain is located his right lumbar spine, more on side of back.   Also has complaints of left thumb pain that started around the same time. No precipitating injury. He is right handed. No loss of strength or decreased hand grip. He doesn't really have a repetitive motion with his left thumb. Pain is the DIP joint when he flexes the joint. No erythema or swelling.    Review of Systems  Constitutional: Negative for chills and fever.  Respiratory: Negative for cough and shortness of breath.   Gastrointestinal: Negative for abdominal pain.  Genitourinary: Negative for difficulty urinating, dysuria and flank pain. Frequency:  left thumb and right lower back   Musculoskeletal: Positive for arthralgias and back pain. Negative for gait problem and myalgias.        Right lower back pain  Left thumb pain x approx 1 week    Allergies Patient has No Known Allergies.  Past Medical History Patient  has a past medical history of Diabetes mellitus without complication (HCC) and Tuberculosis.  Surgical History Patient  has no past surgical history on file.  Family  History Pateint's family history includes Diabetes in his father and mother; Hypertension in his father and mother; Thyroid disease in his mother.  Social History Patient  reports that he has never smoked. He has never used smokeless tobacco. He reports that he drinks alcohol. He reports that he does not use drugs.    Objective: Vitals:   11/10/18 1412  BP: 116/78  Pulse: 60  Temp: 98.2 F (36.8 C)  TempSrc: Oral  SpO2: 99%  Weight: 134 lb 12.8 oz (61.1 kg)  Height: 5\' 8"  (1.727 m)    Body mass index is 20.5 kg/m.  Physical Exam  Constitutional: He appears well-developed and well-nourished.  Musculoskeletal:  Left thumb: no erythema, edema, full ROM. Pain with flexion in DIP joint. Negative finklestein's.   Pain with palpation over right lumbar spine around L3-L4 in paraspinal area. No pain over vertebral spine.   Straight leg test negative bilaterally. Strength intact in lower extremities.   Neurological: He displays normal reflexes. No sensory deficit. He exhibits normal muscle tone. Coordination normal.  Vitals reviewed.      Assessment/plan: 1. Lumbar pain Appears MSK. We are going to do conservative therapy for 2 weeks as no red flags on exam or indication for imaging at this time.  If not better in 2 weeks will need to f/u for imaging and further eval. toradol shot in office and then ibuprofen Tid as needed for pain. Handout given on stretches for lower back.  Also recommended heating pad prn. Precautions given.  - ketorolac (TORADOL) injection 60 mg  2. Thumb pain, left Normal exam. Will do NSAIDs prn. If not better will f/u with pcp.    Return if symptoms worsen or fail to improve.   Orland Mustard, MD [place] [facility]       11/10/2018

## 2018-11-10 NOTE — Patient Instructions (Signed)
Ibuprofen every 8 hours with food as needed for pain.  Heating pad Stretches   If not better in 2 weeks let us know.

## 2018-11-14 ENCOUNTER — Ambulatory Visit: Payer: BLUE CROSS/BLUE SHIELD | Admitting: Family Medicine

## 2018-11-14 ENCOUNTER — Encounter: Payer: Self-pay | Admitting: Family Medicine

## 2018-11-14 ENCOUNTER — Other Ambulatory Visit: Payer: Self-pay

## 2018-11-14 VITALS — BP 118/80 | HR 84 | Temp 98.1°F | Ht 68.0 in | Wt 137.4 lb

## 2018-11-14 DIAGNOSIS — M79604 Pain in right leg: Secondary | ICD-10-CM

## 2018-11-14 DIAGNOSIS — M62830 Muscle spasm of back: Secondary | ICD-10-CM | POA: Diagnosis not present

## 2018-11-14 DIAGNOSIS — M62838 Other muscle spasm: Secondary | ICD-10-CM | POA: Diagnosis not present

## 2018-11-14 DIAGNOSIS — M545 Low back pain, unspecified: Secondary | ICD-10-CM

## 2018-11-14 MED ORDER — BACLOFEN 10 MG PO TABS: 10.0000 mg | ORAL_TABLET | Freq: Three times a day (TID) | ORAL | 0 refills | Status: DC

## 2018-11-14 MED ORDER — HYDROCODONE-ACETAMINOPHEN 5-325 MG PO TABS: 1.0000 | ORAL_TABLET | Freq: Four times a day (QID) | ORAL | 0 refills | Status: DC | PRN

## 2018-11-14 NOTE — Progress Notes (Signed)
Joe Boyd is a 42 y.o. male here for an acute visit.  History of Present Illness:   Joe Boyd, CMA acting as scribe for Dr. Helane RimaErica Travares Nelles.   Hip Pain   The incident occurred more than 1 week ago. There was no injury mechanism. The pain is present in the right hip (top of right thigh into back of calf ). The quality of the pain is described as stabbing. The pain is at a severity of 9/10. The pain is severe. The pain has been fluctuating since onset. Associated symptoms include a loss of motion. Pertinent negatives include no inability to bear weight. Associated symptoms comments: Is painful with weight bearing. Lifting leg to get up stairs or get in and out of car. . The symptoms are aggravated by movement. He has tried heat, acetaminophen and NSAIDs for the symptoms. The treatment provided mild relief.   Patient denies any changes in bowel or bladder. He denies any numbness or tingling. He has had no injury.   PMHx, SurgHx, SocialHx, Medications, and Allergies were reviewed in the Visit Navigator and updated as appropriate.  Current Medications:   .  glucose blood (BAYER CONTOUR TEST) test strip, Test once every 3 days., Disp: 100 each, Rfl: 5 .  ibuprofen (ADVIL,MOTRIN) 600 MG tablet, Take 1 tablet (600 mg total) by mouth every 8 (eight) hours as needed., Disp: 30 tablet, Rfl: 0 .  KOMBIGLYZE XR 2.04-999 MG TB24, TAKE 2 TABLETS BY MOUTH DAILY, Disp: 180 tablet, Rfl: 1 .  miglitol (GLYSET) 50 MG tablet, 1 before lunch and dinner (Patient taking differently: 1 before lunch and dinner), Disp: 60 tablet, Rfl: 3 .  Multiple Vitamin (MULTIVITAMIN WITH MINERALS) TABS tablet, Take 1 tablet by mouth daily., Disp: , Rfl:  .  Probiotic Product (PROBIOTIC-10) CHEW, Chew 2 capsules by mouth at bedtime., Disp: , Rfl:    No Known Allergies   Review of Systems:   Pertinent items are noted in the HPI. Otherwise, ROS is negative.  Vitals:   Vitals:   11/14/18 1522  BP: 118/80    Pulse: 84  Temp: 98.1 F (36.7 C)  TempSrc: Oral  SpO2: 98%  Weight: 137 lb 6.4 oz (62.3 kg)  Height: 5\' 8"  (1.727 m)     Body mass index is 20.89 kg/m.  Physical Exam:   Physical Exam  Constitutional: He is oriented to person, place, and time. He appears well-developed and well-nourished. No distress.  HENT:  Head: Normocephalic and atraumatic.  Right Ear: External ear normal.  Left Ear: External ear normal.  Nose: Nose normal.  Eyes: Conjunctivae are normal.  Neck: Normal range of motion.  Cardiovascular: Normal rate and regular rhythm.  Pulmonary/Chest: Effort normal. No respiratory distress.  Abdominal: Soft. Bowel sounds are normal. There is no tenderness.  Musculoskeletal: Normal range of motion. He exhibits no edema or deformity.       Back:  TTP right SI and right piriformis.  Neurological: He is alert and oriented to person, place, and time.  Skin: Skin is warm. He is not diaphoretic.  Psychiatric: He has a normal mood and affect. His behavior is normal.   Assessment and Plan:   Joe Boyd was seen today for hip pain.  Diagnoses and all orders for this visit:  Lumbar paraspinal muscle spasm -     Ambulatory referral to Sports Medicine  Spasm of right piriformis muscle -     HYDROcodone-acetaminophen (NORCO/VICODIN) 5-325 MG tablet; Take 1 tablet by mouth every 6 (six)  hours as needed for moderate pain. -     baclofen (LIORESAL) 10 MG tablet; Take 1 tablet (10 mg total) by mouth 3 (three) times daily. -     Ambulatory referral to Sports Medicine  Lumbar pain with radiation down right leg -     Ambulatory referral to Sports Medicine   . Reviewed expectations re: course of current medical issues. . Discussed self-management of symptoms. . Outlined signs and symptoms indicating need for more acute intervention. . Patient verbalized understanding and all questions were answered. Marland Kitchen Health Maintenance issues including appropriate healthy diet, exercise, and  smoking avoidance were discussed with patient. . See orders for this visit as documented in the electronic medical record. . Patient received an After Visit Summary.  CMA served as Neurosurgeon during this visit. History, Physical, and Plan performed by medical provider. The above documentation has been reviewed and is accurate and complete. Helane Rima, D.O.  Helane Rima, DO [place] 11/17/2018

## 2018-11-15 ENCOUNTER — Ambulatory Visit: Payer: BLUE CROSS/BLUE SHIELD | Admitting: Sports Medicine

## 2018-11-16 ENCOUNTER — Ambulatory Visit: Payer: BLUE CROSS/BLUE SHIELD | Admitting: Sports Medicine

## 2018-11-16 ENCOUNTER — Encounter: Payer: Self-pay | Admitting: Sports Medicine

## 2018-11-16 VITALS — BP 118/80 | HR 67 | Ht 68.0 in | Wt 136.2 lb

## 2018-11-16 DIAGNOSIS — M9907 Segmental and somatic dysfunction of upper extremity: Secondary | ICD-10-CM | POA: Diagnosis not present

## 2018-11-16 DIAGNOSIS — M9903 Segmental and somatic dysfunction of lumbar region: Secondary | ICD-10-CM | POA: Diagnosis not present

## 2018-11-16 DIAGNOSIS — M9908 Segmental and somatic dysfunction of rib cage: Secondary | ICD-10-CM | POA: Diagnosis not present

## 2018-11-16 DIAGNOSIS — M62830 Muscle spasm of back: Secondary | ICD-10-CM | POA: Diagnosis not present

## 2018-11-16 DIAGNOSIS — M62838 Other muscle spasm: Secondary | ICD-10-CM

## 2018-11-16 DIAGNOSIS — M545 Low back pain, unspecified: Secondary | ICD-10-CM

## 2018-11-16 DIAGNOSIS — M9902 Segmental and somatic dysfunction of thoracic region: Secondary | ICD-10-CM | POA: Diagnosis not present

## 2018-11-16 DIAGNOSIS — M9904 Segmental and somatic dysfunction of sacral region: Secondary | ICD-10-CM

## 2018-11-16 DIAGNOSIS — M79645 Pain in left finger(s): Secondary | ICD-10-CM | POA: Insufficient documentation

## 2018-11-16 DIAGNOSIS — M9905 Segmental and somatic dysfunction of pelvic region: Secondary | ICD-10-CM

## 2018-11-16 DIAGNOSIS — M79604 Pain in right leg: Secondary | ICD-10-CM | POA: Insufficient documentation

## 2018-11-16 NOTE — Progress Notes (Signed)
PROCEDURE NOTE : OSTEOPATHIC MANIPULATION The decision today to treat with Osteopathic Manipulative Therapy (OMT) was based on physical exam findings. Verbal consent was obtained following a discussion with the patient regarding the of risks, benefits and potential side effects, including an acute pain flare,post manipulation soreness and need for repeat treatments.     Contraindications to OMT: NONE  Manipulation was performed as below: Regions Treated OMT Techniques Used  1. Thoracic spine 2. Ribs 3. Lumbar spine 4. Pelvis 5. Sacrum 6. Upper extremities . HVLA . muscle energy . myofascial release   The patient tolerated the treatment well and reported Improved symptoms following treatment today. Patient was given medications, exercises, stretches and lifestyle modifications per AVS and verbally.   OSTEOPATHIC/STRUCTURAL EXAM:   T2 -6 Neutral, Rotated LEFT, Sidebent RIGHT Rib 8 Right  Posterior L4 FRS right (Flexed, Rotated & Sidebent) Right psoas spasm Right anterior innonimate L on L sacral torsion left thumb subluxed with slight flexure contracture

## 2018-11-16 NOTE — Progress Notes (Signed)
Joe Boyd. Joe Boyd Sports [facility] [phone]  Joe Boyd - 42 y.o. male MRN [id number]  Date of birth: Jul 05, 1976  Visit Date: 11/16/2018  PCP: Helane Rima, DO   Referred by: Helane Rima, DO   Scribe(s) for today's visit: Christoper Fabian, LAT, ATC  SUBJECTIVE:  Joe Boyd is here for New Patient (Initial Visit) (LBP and R hip/leg pain)  Referred by: Dr. Earlene Plater  HPI: His LBP and R LE symptoms INITIALLY: Began about a week ago w/ no known MOI Described as 5/10 pain is dull and aching, radiating to R post LE (calf) Worsened with weight bearing and actively moving his R LE; R sidelying Improved with Baclofen, Vicodin and IcyHot Additional associated symptoms include: no N/T noted in B LEs; no changes in B/B and no change in symptoms w/ cough/sneeze    At this time symptoms are improving compared to onset  He has been taking Baclofen 10 mg TID and Vicodine 5-325 mg q 6 hours prn.  Before being prescribed the Baclofen and Vicodin, he tired heat, NSAIDs and Tylenol w/ no relief.  REVIEW OF SYSTEMS: Reports night time disturbances. Denies fevers, chills, or night sweats. Denies unexplained weight loss. Denies personal history of cancer. Denies changes in bowel or bladder habits. Denies recent unreported falls. Denies new or worsening dyspnea or wheezing. Denies headaches or dizziness.  Denies numbness, tingling or weakness  In the extremities.  Denies dizziness or presyncopal episodes Denies lower extremity edema    HISTORY:  Prior history reviewed and updated per electronic medical record.  Social History   Occupational History  . Occupation: Ship broker: [employer]  Tobacco Use  . Smoking status: Never Smoker  . Smokeless tobacco: Never Used  Substance and Sexual Activity  . Alcohol use: Yes    Comment: occasionally  . Drug use: No  . Sexual activity: Not on file   Social  History   Social History Narrative   Born and raised in [place] until the age of 72. Parents live in [place]. Father is a Development worker, international aid. Divorced. Son is 56. 04/19/2017     Past Medical History:  Diagnosis Date  . Diabetes mellitus without complication (HCC)   . Tuberculosis    Finished Rx in 8/15   No past surgical history on file. family history includes Diabetes in his father and mother; Hypertension in his father and mother; Thyroid disease in his mother. There is no history of Cancer or Heart disease.  DATA OBTAINED & REVIEWED:   Recent Labs    02/24/18 0858 05/31/18 1005 08/24/18 0929  HGBA1C 6.9* 7.2* 7.2*   Problem  Thumb Pain, Left  Lumbar Pain With Radiation Down Right Leg   .   OBJECTIVE:  VS:  HT:5\' 8"  (172.7 cm)   WT:136 lb 3.2 oz (61.8 kg)  BMI:20.71    BP:118/80  HR:67bpm  TEMP: ( )  RESP:98 %   PHYSICAL EXAM: CONSTITUTIONAL: Well-developed, Well-nourished and In no acute distress PSYCHIATRIC: Alert & appropriately interactive. and Not depressed or anxious appearing. RESPIRATORY: No increased work of breathing and Trachea Midline EYES: Pupils are equal., EOM intact without nystagmus. and No scleral icterus.  VASCULAR EXAM: Warm and well perfused NEURO: unremarkable  MSK Exam: BACK Exam: Normal alignment & Contours Skin: No overlying erythema/ecchymosis  MOTOR TESTING: Intact in all LE myotomes and Able to heel and toe walk without difficutly Markedly tight hip flexor on the right greater  than left.  60 degrees flexion contracture position of ease.       RIGHT    LEFT Straight leg raise-------------------------: normal, no pain                         normal, no pain Braggard Stretch Test------------------: normal, no pain                         normal, no pain Slump Sign--------------------------------: normal, no pain                         normal, no pain Popliteal compression test------------: normal, no pain                         normal,  no pain Greater sciatic notch tenderness----: normal, no pain                         normal, no pain   REFLEXES Right Left  DTR - L3/4 -Patellar 2+ 2+  DTR - L5/S1 - Achilles 2+ 2+    ASSESSMENT   1. Lumbar paraspinal muscle spasm   2. Spasm of right piriformis muscle   3. Lumbar pain with radiation down right leg   4. Lumbar pain   5. Thumb pain, left   6. Somatic dysfunction of upper extremity   7. Somatic dysfunction of thoracic region   8. Somatic dysfunction of lumbar region   9. Somatic dysfunction of rib cage region   10. Somatic dysfunction of pelvis region   11. Somatic dysfunction of sacral region     PLAN:  Pertinent additional documentation may be included in corresponding procedure notes, imaging studies, problem based documentation and patient instructions.  Procedures:  . Osteopathic manipulation was performed today based on physical exam findings.  Please see procedure note for further information including Osteopathic Exam findings . Discussed the foundation of treatment for this condition is physical therapy and/or daily (5-6 days/week) therapeutic exercises, focusing on core strengthening, coordination, neuromuscular control/reeducation.  Therapeutic exercises prescribed per procedure note.  Medications:  No orders of the defined types were placed in this encounter.  Discussion/Instructions: Thumb pain, left Responding well to osteopathic manipulation today.  Does have some underlying degenerative changes appreciated based on exam.  Lumbar pain with radiation down right leg Discussed the underlying features of tight hip flexors leading to crouched, fetal like position that results in spinal column compression.  Including lumbar hyperflexion with hypermobility, thoracic flexion with restrictive rotation and cervical lordosis reversal   Ultimately he should do well with conservative measures and home exercise program as well as manipulation performed  today.  .   . Links to [employer] videos provided today per Patient Instructions.  These exercises were developed by Myles LippsEric Goodman, DC with a strong emphasis on core neuromuscular reducation and postural realignment through body-weight exercises. . Discussed red flag symptoms that warrant earlier emergent evaluation and patient voices understanding. . Activity modifications and the importance of avoiding exacerbating activities (limiting pain to no more than a 4 / 10 during or following activity) recommended and discussed.  Follow-up:  . Return in about 2 weeks (around 11/30/2018).  . If any lack of improvement: consider further diagnostic evaluation with Plain film x-rays . At follow up will plan: to consider repeat osteopathic manipulation     CMA/ATC served  as scribe during this visit. History, Physical, and Plan performed by medical provider. Documentation and orders reviewed and attested to.      Andrena Mews, DO    [place] [facility] Physician

## 2018-11-16 NOTE — Progress Notes (Signed)
PROCEDURE NOTE: THERAPEUTIC EXERCISES (97110) 15 minutes spent for Therapeutic exercises as below and as referenced in the AVS.  This included exercises focusing on stretching, strengthening, with significant focus on eccentric aspects.   Proper technique shown and discussed handout in great detail with ATC.  All questions were discussed and answered.   Long term goals include an improvement in range of motion, strength, endurance as well as avoiding reinjury. Frequency of visits is one time as determined during today's  office visit. Frequency of exercises to be performed is as per handout.  EXERCISES REVIEWED:  Goodman Exercises  Thoracic Mobility  Pelvic recruitment/tilting 

## 2018-11-16 NOTE — Patient Instructions (Addendum)
Also check out [employer]Foundation Training" which is a program developed by Dr. Myles LippsEric Goodman.   There are links to a couple of his YouTube Videos below and I would like to see performing one of his videos 5-6 days per week.    A good intro video is: "Independence from Pain 7-minute Video" - [email or web address]   Exercises that focus more on the neck are as below: Dr. Derrill KayGoodman with [employer] teaching neck and shoulder details Part 1 - [email or web address] Part 2 Dr. Derrill KayGoodman with [facility] [employer] [employer] quick routine to practice daily - [email or web address]  Do not try to attempt the entire video when first beginning.    Try breaking of each exercise that he goes into shorter segments.  Otherwise if they perform an exercise for 45 seconds, start with 15 seconds and rest and then resume when they begin the new activity.  If you work your way up to being able to do these videos without having to stop, I expect you will see significant improvements in your pain.  If you enjoy his videos and would like to find out more you can look on his website: [email or web address].  He has a workout streaming option as well as a DVD set available for purchase.  Amazon has the best price for his DVDs.    Please perform the exercise program that we have prepared for you and gone over in detail on a daily basis.  In addition to the handout you were provided you can access your program through: [email or web address]   Your unique program code is:  [email or web address]

## 2018-11-20 ENCOUNTER — Encounter: Payer: Self-pay | Admitting: Sports Medicine

## 2018-11-20 NOTE — Assessment & Plan Note (Signed)
Discussed the underlying features of tight hip flexors leading to crouched, fetal like position that results in spinal column compression.  Including lumbar hyperflexion with hypermobility, thoracic flexion with restrictive rotation and cervical lordosis reversal   Ultimately he should do well with conservative measures and home exercise program as well as manipulation performed today.

## 2018-11-20 NOTE — Assessment & Plan Note (Signed)
Responding well to osteopathic manipulation today.  Does have some underlying degenerative changes appreciated based on exam.

## 2018-11-29 ENCOUNTER — Ambulatory Visit: Payer: BLUE CROSS/BLUE SHIELD | Admitting: Sports Medicine

## 2019-01-05 ENCOUNTER — Other Ambulatory Visit (INDEPENDENT_AMBULATORY_CARE_PROVIDER_SITE_OTHER): Payer: BLUE CROSS/BLUE SHIELD

## 2019-01-05 DIAGNOSIS — E1165 Type 2 diabetes mellitus with hyperglycemia: Secondary | ICD-10-CM | POA: Diagnosis not present

## 2019-01-05 LAB — LIPID PANEL
Cholesterol: 110 mg/dL (ref 0–200)
HDL: 42.2 mg/dL (ref >39.00)
LDL Cholesterol: 52 mg/dL (ref 0–99)
NonHDL: 67.83
Total CHOL/HDL Ratio: 3
Triglycerides: 77 mg/dL (ref 0.0–149.0)
VLDL: 15.4 mg/dL (ref 0.0–40.0)

## 2019-01-05 LAB — COMPREHENSIVE METABOLIC PANEL
ALT: 24 U/L (ref 0–53)
AST: 22 U/L (ref 0–37)
Albumin: 4.6 g/dL (ref 3.5–5.2)
Alkaline Phosphatase: 50 U/L (ref 39–117)
BUN: 16 mg/dL (ref 6–23)
CO2: 29 mEq/L (ref 19–32)
Calcium: 10.2 mg/dL (ref 8.4–10.5)
Chloride: 102 mEq/L (ref 96–112)
Creatinine, Ser: 1.02 mg/dL (ref 0.40–1.50)
GFR: 84.08 mL/min (ref >60.00)
Glucose, Bld: 82 mg/dL (ref 70–99)
Potassium: 4.8 mEq/L (ref 3.5–5.1)
Sodium: 138 mEq/L (ref 135–145)
Total Bilirubin: 0.8 mg/dL (ref 0.2–1.2)
Total Protein: 7.4 g/dL (ref 6.0–8.3)

## 2019-01-05 LAB — HEMOGLOBIN A1C: Hgb A1c MFr Bld: 7.1 % — ABNORMAL HIGH (ref 4.6–6.5)

## 2019-01-09 ENCOUNTER — Other Ambulatory Visit: Payer: BLUE CROSS/BLUE SHIELD

## 2019-01-12 ENCOUNTER — Ambulatory Visit: Payer: BLUE CROSS/BLUE SHIELD | Admitting: Endocrinology

## 2019-01-13 ENCOUNTER — Ambulatory Visit: Payer: BLUE CROSS/BLUE SHIELD | Admitting: Endocrinology

## 2019-01-13 ENCOUNTER — Telehealth: Payer: Self-pay | Admitting: Endocrinology

## 2019-01-13 ENCOUNTER — Encounter: Payer: Self-pay | Admitting: Endocrinology

## 2019-01-13 VITALS — BP 118/80 | HR 84 | Ht 68.0 in | Wt 134.0 lb

## 2019-01-13 DIAGNOSIS — E1165 Type 2 diabetes mellitus with hyperglycemia: Secondary | ICD-10-CM | POA: Diagnosis not present

## 2019-01-13 LAB — GLUCOSE, POCT (MANUAL RESULT ENTRY): POC Glucose: 112 mg/dl — AB (ref 70–99)

## 2019-01-13 NOTE — Telephone Encounter (Signed)
Patient stated at check out that the name of the new medication he is taking is: Atorvastatin 10 mg-once every 5 days.

## 2019-01-13 NOTE — Patient Instructions (Signed)
Check blood sugars on waking up days a week  Also check blood sugars about 2 hours after meals and do this after different meals by rotation  Recommended blood sugar levels on waking up are 80-130 and about 2 hours after meal is 130-160  Please bring your blood sugar monitor to each visit, thank you  Call name of BP med  Exercise daily

## 2019-01-13 NOTE — Telephone Encounter (Signed)
Medication added to pt med list.  

## 2019-01-13 NOTE — Progress Notes (Signed)
Patient ID: Joe Boyd, male   DOB: 1976/01/17, 43 y.o.   MRN: [id number]           Reason for Appointment: Follow-up visit for Type 2 Diabetes  Referring physician: none  History of Present Illness:          Diagnosis: Type 2 diabetes mellitus, date of diagnosis: 2012        Past history:  At time of diagnosis he was having symptoms of increased thirst and urination and he was seen by his physician when he started feeling weak and dizzy also. His A1c at diagnosis was 12.8. He thinks he was treated with metformin only and also he started changing his diet along with eliminating sweet soft drinks.  Initially was treated with only 500 mg of metformin ER and in 2014 this was increased to 1000 mg.  He has not had any other medications for diabetes. He was last seen by his endocrinologist in [place] in 07/2014 when his A1c was 6.5 and no changes were made A1c of over 7%  in 7/16 and he was started on Kombiglyze XR In 6/17 with his A1c going up to 7.6 he was given Glyset 25 mg at lunch also in addition to [employer]  Recent history:   A1c is 7.1, previously 7.2       Oral hypoglycemic drugs the patient is taking are:   Kombiglyze XR 2.04/999, 2 tablets daily, Glyset 50 mg at lunch and dinner  Current management, blood sugar patterns and problems identified:  Again has not checked his sugars much and has only 1 reading in the last month which was 113  He says he goes to bed an hour after eating dinner and is too busy to check the blood sugar during the day  Has been advised to take Glyset with both lunch and dinner is usually doing this  He is now usually not eating much at breakfast at all and also cutting back on meats  However he is exercising only rarely  Lab glucose readings are fasting and near normal       Side effects from medications have been: None Compliance with the medical regimen: Fair   Glucose monitoring:  done occasionally,        Glucometer:  Contour       Blood Glucose readings:   Self-care:  Meals: 3 meals per day. Breakfast is recently skipped Tries to get some protein with his lunch, sometimes yogurt, sometimes has chicken.    Usually avoiding snacks          Exercise: not regular         Dietician visit, most recent: 7/16                Weight history:   Wt Readings from Last 3 Encounters:  01/13/19 134 lb (60.8 kg)  11/16/18 136 lb 3.2 oz (61.8 kg)  11/14/18 137 lb 6.4 oz (62.3 kg)    Glycemic control:   Lab Results  Component Value Date   HGBA1C 7.1 (H) 01/05/2019   HGBA1C 7.2 (H) 08/24/2018   HGBA1C 7.2 (H) 05/31/2018   Lab Results  Component Value Date   MICROALBUR 1.0 02/24/2018   LDLCALC 52 01/05/2019   CREATININE 1.02 01/05/2019   Office Visit on 01/13/2019  Component Date Value Ref Range Status  . POC Glucose 01/13/2019 112* 70 - 99 mg/dl Final      Allergies as of 01/13/2019   No Known Allergies  Medication List       Accurate as of January 13, 2019  9:18 AM. Always use your most recent med list.        baclofen 10 MG tablet Commonly known as:  LIORESAL Take 1 tablet (10 mg total) by mouth 3 (three) times daily.   glucose blood test strip Commonly known as:  BAYER CONTOUR TEST Test once every 3 days.   HYDROcodone-acetaminophen 5-325 MG tablet Commonly known as:  NORCO/VICODIN Take 1 tablet by mouth every 6 (six) hours as needed for moderate pain.   ibuprofen 600 MG tablet Commonly known as:  ADVIL,MOTRIN Take 1 tablet (600 mg total) by mouth every 8 (eight) hours as needed.   KOMBIGLYZE XR 2.04-999 MG Tb24 Generic drug:  Saxagliptin-Metformin TAKE 2 TABLETS BY MOUTH DAILY   miglitol 50 MG tablet Commonly known as:  GLYSET 1 before lunch and dinner   multivitamin with minerals Tabs tablet Take 1 tablet by mouth daily.   PROBIOTIC-10 Chew Chew 2 capsules by mouth at bedtime.       Allergies: No Known Allergies  Past Medical History:  Diagnosis Date  . Diabetes  mellitus without complication (HCC)   . Tuberculosis    Finished Rx in 8/15    History reviewed. No pertinent surgical history.  Family History  Problem Relation Age of Onset  . Diabetes Mother   . Hypertension Mother   . Thyroid disease Mother   . Diabetes Father   . Hypertension Father   . Cancer Neg Hx   . Heart disease Neg Hx     Social History:  reports that he has never smoked. He has never used smokeless tobacco. He reports current alcohol use. He reports that he does not use drugs.    Review of Systems        Lipids:  LDL and triglycerides  normal without medication improved HDL low normal again, previously had been down to 35      Lab Results  Component Value Date   CHOL 110 01/05/2019   CHOL 152 02/24/2018   CHOL 128 03/08/2017   Lab Results  Component Value Date   HDL 42.20 01/05/2019   HDL 41.30 02/24/2018   HDL 35.40 (L) 03/08/2017   Lab Results  Component Value Date   LDLCALC 52 01/05/2019   LDLCALC 89 02/24/2018   LDLCALC 65 03/08/2017   Lab Results  Component Value Date   TRIG 77.0 01/05/2019   TRIG 108.0 02/24/2018   TRIG 139.0 03/08/2017   Lab Results  Component Value Date   CHOLHDL 3 01/05/2019   CHOLHDL 4 02/24/2018   CHOLHDL 4 03/08/2017   No results found for: LDLDIRECT                 Thyroid: Has History of a small Euthyroid goiter   Lab Results  Component Value Date   TSH 1.18 05/26/2016     LABS:  Office Visit on 01/13/2019  Component Date Value Ref Range Status  . POC Glucose 01/13/2019 112* 70 - 99 mg/dl Final    Physical Examination:  BP 118/80 (BP Location: Left Arm, Patient Position: Sitting, Cuff Size: Normal)   Pulse 84   Ht 5\' 8"  (1.727 m)   Wt 134 lb (60.8 kg)   SpO2 99%   BMI 20.37 kg/m     ASSESSMENT:  Diabetes type 2 , nonobese See history of present illness for detailed discussion of current diabetes management, blood sugar patterns and problems identified  He is on Kombiglyze XR and  Glyset  A1c is 7.1, in the past has been as low as 6.1  Although his blood sugars are reasonably controlled he probably has high postprandial readings which are not documented Also can do better with exercise  Overall he is trying to cut back on portions and get a protein with lunch and dinner  LIPIDS: Adequately controlled without medications and has no other risk factors  He says that he is taking some unknown blood pressure pill once every 5 days as a prophylactic but he takes this from his father's prescription and no records available.  Advised him to call about this Does not need to be on a prophylactic ACE inhibitor considering his mild diabetes and negative microalbumin  PLAN:   He will continue to take Glyset with every meal  Try to check consistently after meals even if it is 1 hour after eating at night  Bring monitor for download on each visit  Start walking or other aerobic exercise  No change in medications as yet     Reather Littler 01/13/2019, 9:18 AM   Note: This office note was prepared with Dragon voice recognition system technology. Any transcriptional errors that result from this process are unintentional.

## 2019-02-22 ENCOUNTER — Telehealth: Payer: Self-pay | Admitting: Endocrinology

## 2019-02-22 NOTE — Telephone Encounter (Signed)
MEDICATION: Miglitol 50 MG  PHARMACY:  [facility] [address]  IS THIS A 90 DAY SUPPLY : unknown  IS PATIENT OUT OF MEDICATION: yes  IF NOT; HOW MUCH IS LEFT:   LAST APPOINTMENT DATE: @1 /17/2020  NEXT APPOINTMENT DATE:@4 /14/2020  DO WE HAVE YOUR PERMISSION TO LEAVE A DETAILED MESSAGE: yes  OTHER COMMENTS:    **Let patient know to contact pharmacy at the end of the day to make sure medication is ready. **  ** Please notify patient to allow 48-72 hours to process**  **Encourage patient to contact the pharmacy for refills or they can request refills through [facility]**

## 2019-02-23 ENCOUNTER — Other Ambulatory Visit: Payer: Self-pay

## 2019-02-23 MED ORDER — MIGLITOL 50 MG PO TABS: ORAL_TABLET | ORAL | 3 refills | Status: DC

## 2019-02-23 NOTE — Telephone Encounter (Signed)
Rx sent 

## 2019-02-24 ENCOUNTER — Other Ambulatory Visit: Payer: Self-pay | Admitting: Endocrinology

## 2019-03-01 ENCOUNTER — Telehealth: Payer: Self-pay

## 2019-03-01 ENCOUNTER — Other Ambulatory Visit: Payer: Self-pay

## 2019-03-01 MED ORDER — ACARBOSE 50 MG PO TABS: ORAL_TABLET | ORAL | 3 refills | Status: DC

## 2019-03-01 NOTE — Telephone Encounter (Signed)
Pt's pharmacy sent a fax stating that the Glyset 50mg  is unavailable and 25mg  tablets are $197.00 after insurance.  Do you have an alternative the pt can try?

## 2019-03-01 NOTE — Telephone Encounter (Signed)
He can switch to acarbose 50 mg

## 2019-03-01 NOTE — Telephone Encounter (Signed)
Patient has returned call. 

## 2019-03-01 NOTE — Telephone Encounter (Signed)
Called pt again and was able to leave detailed voicemail with medication change. Pt urged to call back if he should have any questions.

## 2019-03-01 NOTE — Telephone Encounter (Signed)
Attempted to call pt and informed him of medication change from [facility] to Acarbose. Pt did not answer phone call and voicemail box was full. Will attempt to call back at a later time.

## 2019-03-06 LAB — HM DIABETES EYE EXAM

## 2019-03-07 ENCOUNTER — Encounter: Payer: Self-pay | Admitting: Physician Assistant

## 2019-04-11 ENCOUNTER — Other Ambulatory Visit: Payer: Self-pay

## 2019-04-11 ENCOUNTER — Other Ambulatory Visit (INDEPENDENT_AMBULATORY_CARE_PROVIDER_SITE_OTHER): Payer: BLUE CROSS/BLUE SHIELD

## 2019-04-11 DIAGNOSIS — E1165 Type 2 diabetes mellitus with hyperglycemia: Secondary | ICD-10-CM | POA: Diagnosis not present

## 2019-04-11 LAB — COMPREHENSIVE METABOLIC PANEL
ALT: 23 U/L (ref 0–53)
AST: 21 U/L (ref 0–37)
Albumin: 4.6 g/dL (ref 3.5–5.2)
Alkaline Phosphatase: 51 U/L (ref 39–117)
BUN: 20 mg/dL (ref 6–23)
CO2: 29 mEq/L (ref 19–32)
Calcium: 10.3 mg/dL (ref 8.4–10.5)
Chloride: 106 mEq/L (ref 96–112)
Creatinine, Ser: 1 mg/dL (ref 0.40–1.50)
GFR: 80.84 mL/min (ref >60.00)
Glucose, Bld: 102 mg/dL — ABNORMAL HIGH (ref 70–99)
Potassium: 5.4 mEq/L — ABNORMAL HIGH (ref 3.5–5.1)
Sodium: 144 mEq/L (ref 135–145)
Total Bilirubin: 0.6 mg/dL (ref 0.2–1.2)
Total Protein: 7.6 g/dL (ref 6.0–8.3)

## 2019-04-11 LAB — MICROALBUMIN / CREATININE URINE RATIO
Creatinine,U: 124.5 mg/dL
Microalb Creat Ratio: 0.6 mg/g (ref 0.0–30.0)
Microalb, Ur: <0.7 mg/dL (ref 0.0–1.9)

## 2019-04-11 LAB — HEMOGLOBIN A1C: Hgb A1c MFr Bld: 6.7 % — ABNORMAL HIGH (ref 4.6–6.5)

## 2019-04-17 ENCOUNTER — Ambulatory Visit: Payer: BLUE CROSS/BLUE SHIELD | Admitting: Family Medicine

## 2019-04-18 ENCOUNTER — Encounter: Payer: Self-pay | Admitting: Endocrinology

## 2019-04-18 ENCOUNTER — Ambulatory Visit (INDEPENDENT_AMBULATORY_CARE_PROVIDER_SITE_OTHER): Payer: BLUE CROSS/BLUE SHIELD | Admitting: Endocrinology

## 2019-04-18 ENCOUNTER — Other Ambulatory Visit: Payer: Self-pay

## 2019-04-18 DIAGNOSIS — E049 Nontoxic goiter, unspecified: Secondary | ICD-10-CM | POA: Diagnosis not present

## 2019-04-18 DIAGNOSIS — E119 Type 2 diabetes mellitus without complications: Secondary | ICD-10-CM

## 2019-04-18 NOTE — Progress Notes (Signed)
Patient ID: Joe Boyd, male   DOB: 18-Jul-1976, 43 y.o.   MRN: [id number]           Reason for Appointment: Follow-up visit for Type 2 Diabetes   Today's office visit was provided via telemedicine using video technique Explained to the patient and the the limitations of evaluation and management by telemedicine and the availability of in person appointments.  The patient understood the limitations and agreed to proceed. Patient also understood that the telehealth visit is billable. . Location of the patient: Home . Location of the provider: Office Only the patient and myself were participating in the encounter   History of Present Illness:          Diagnosis: Type 2 diabetes mellitus, date of diagnosis: 2012        Past history:  At time of diagnosis he was having symptoms of increased thirst and urination and he was seen by his physician when he started feeling weak and dizzy also. His A1c at diagnosis was 12.8. He thinks he was treated with metformin only and also he started changing his diet along with eliminating sweet soft drinks.  Initially was treated with only 500 mg of metformin ER and in 2014 this was increased to 1000 mg.  He has not had any other medications for diabetes. He was last seen by his endocrinologist in [place] in 07/2014 when his A1c was 6.5 and no changes were made A1c of over 7%  in 7/16 and he was started on Kombiglyze XR In 6/17 with his A1c going up to 7.6 he was given Glyset 25 mg at lunch also in addition to [employer]  Recent history:   A1c is 6.7, previously 7.1 and 7.2       Oral hypoglycemic drugs the patient is taking are:   Kombiglyze XR 2.04/999, 2 tablets daily, Glyset 50 mg at lunch and dinner  Current management, blood sugar patterns and problems identified:  Although his weight is about the same his blood sugar control is somewhat better  He says that he is trying to walk more regularly, previously not exercising  Also may not be  eating out as much as he did and has more time planning his meals since he is not as busy at work  As before he forgets to check his sugars especially after meals  He has been trying to take his Kombiglyze and Glyset consistently including taking Glyset twice a day  He is asking about 2 episodes of vomiting in the morning and once the saw his Kombiglyze tablets in the emesis from the night before  Lab glucose reading was only 104       Side effects from medications have been: None Compliance with the medical regimen: Fair   Glucose monitoring:  done occasionally,        Glucometer:  Contour      Blood Glucose readings: Recent readings 94 and 100 fasting, 153 at dinnertime  Self-care:  Meals: 3 meals per day. Breakfast is recently skipped Tries to get some protein with his lunch, sometimes yogurt, sometimes has chicken.    Usually avoiding snacks          Exercise: regular walking now         Dietician visit, most recent: 7/16                Weight history:   Wt Readings from Last 3 Encounters:  01/13/19 134 lb (60.8 kg)  11/16/18 136 lb  3.2 oz (61.8 kg)  11/14/18 137 lb 6.4 oz (62.3 kg)    Glycemic control:   Lab Results  Component Value Date   HGBA1C 6.7 (H) 04/11/2019   HGBA1C 7.1 (H) 01/05/2019   HGBA1C 7.2 (H) 08/24/2018   Lab Results  Component Value Date   MICROALBUR <0.7 04/11/2019   LDLCALC 52 01/05/2019   CREATININE 1.00 04/11/2019   No visits with results within 1 Week(s) from this visit.  Latest known visit with results is:  Lab on 04/11/2019  Component Date Value Ref Range Status  . Sodium 04/11/2019 144  135 - 145 mEq/L Final  . Potassium 04/11/2019 5.4* 3.5 - 5.1 mEq/L Final  . Chloride 04/11/2019 106  96 - 112 mEq/L Final  . CO2 04/11/2019 29  19 - 32 mEq/L Final  . Glucose, Bld 04/11/2019 102* 70 - 99 mg/dL Final  . BUN 40/98/1191 20  6 - 23 mg/dL Final  . Creatinine, Ser 04/11/2019 1.00  0.40 - 1.50 mg/dL Final  . Total Bilirubin  04/11/2019 0.6  0.2 - 1.2 mg/dL Final  . Alkaline Phosphatase 04/11/2019 51  39 - 117 U/L Final  . AST 04/11/2019 21  0 - 37 U/L Final  . ALT 04/11/2019 23  0 - 53 U/L Final  . Total Protein 04/11/2019 7.6  6.0 - 8.3 g/dL Final  . Albumin 47/82/9562 4.6  3.5 - 5.2 g/dL Final  . Calcium 13/07/6577 10.3  8.4 - 10.5 mg/dL Final  . GFR 46/96/2952 80.84  >60.00 mL/min Final  . Hgb A1c MFr Bld 04/11/2019 6.7* 4.6 - 6.5 % Final   Glycemic Control Guidelines for People with Diabetes:Non Diabetic:  <6%Goal of Therapy: <7%Additional Action Suggested:  >8%   . Microalb, Ur 04/11/2019 <0.7  0.0 - 1.9 mg/dL Final  . Creatinine,U 84/13/2440 124.5  mg/dL Final  . Microalb Creat Ratio 04/11/2019 0.6  0.0 - 30.0 mg/g Final      Allergies as of 04/18/2019   No Known Allergies     Medication List       Accurate as of April 18, 2019  3:53 PM. Always use your most recent med list.        acarbose 50 MG tablet Commonly known as:  PRECOSE TAKE 1 TABLET BY MOUTH THREE TIMES DAILY WITH MEALS.   atorvastatin 10 MG tablet Commonly known as:  LIPITOR Take 10 mg by mouth daily. TAKE 1 TABLET BY MOUTH ONCE EVERY 3 DAYS.   glucose blood test strip Commonly known as:  IT consultant Test once every 3 days.   HYDROcodone-acetaminophen 5-325 MG tablet Commonly known as:  NORCO/VICODIN Take 1 tablet by mouth every 6 (six) hours as needed for moderate pain.   ibuprofen 600 MG tablet Commonly known as:  ADVIL Take 1 tablet (600 mg total) by mouth every 8 (eight) hours as needed.   Kombiglyze XR 2.04-999 MG Tb24 Generic drug:  Saxagliptin-Metformin TAKE 2 TABLETS BY MOUTH DAILY   multivitamin with minerals Tabs tablet Take 1 tablet by mouth daily.   Probiotic-10 Chew Chew 2 capsules by mouth at bedtime.       Allergies: No Known Allergies  Past Medical History:  Diagnosis Date  . Diabetes mellitus without complication (HCC)   . Tuberculosis    Finished Rx in 8/15    History  reviewed. No pertinent surgical history.  Family History  Problem Relation Age of Onset  . Diabetes Mother   . Hypertension Mother   . Thyroid disease  Mother   . Diabetes Father   . Hypertension Father   . Cancer Neg Hx   . Heart disease Neg Hx     Social History:  reports that he has never smoked. He has never used smokeless tobacco. He reports current alcohol use. He reports that he does not use drugs.    Review of Systems        Lipids:  LDL and triglycerides  normal without any pharmacological treatment HDL low normal, previously had been down to 35      Lab Results  Component Value Date   CHOL 110 01/05/2019   CHOL 152 02/24/2018   CHOL 128 03/08/2017   Lab Results  Component Value Date   HDL 42.20 01/05/2019   HDL 41.30 02/24/2018   HDL 35.40 (L) 03/08/2017   Lab Results  Component Value Date   LDLCALC 52 01/05/2019   LDLCALC 89 02/24/2018   LDLCALC 65 03/08/2017   Lab Results  Component Value Date   TRIG 77.0 01/05/2019   TRIG 108.0 02/24/2018   TRIG 139.0 03/08/2017   Lab Results  Component Value Date   CHOLHDL 3 01/05/2019   CHOLHDL 4 02/24/2018   CHOLHDL 4 03/08/2017   No results found for: LDLDIRECT                 Thyroid: Has History of a small goiter with normal TSH   Lab Results  Component Value Date   TSH 1.18 05/26/2016     LABS:  No visits with results within 1 Week(s) from this visit.  Latest known visit with results is:  Lab on 04/11/2019  Component Date Value Ref Range Status  . Sodium 04/11/2019 144  135 - 145 mEq/L Final  . Potassium 04/11/2019 5.4* 3.5 - 5.1 mEq/L Final  . Chloride 04/11/2019 106  96 - 112 mEq/L Final  . CO2 04/11/2019 29  19 - 32 mEq/L Final  . Glucose, Bld 04/11/2019 102* 70 - 99 mg/dL Final  . BUN 16/09/9603 20  6 - 23 mg/dL Final  . Creatinine, Ser 04/11/2019 1.00  0.40 - 1.50 mg/dL Final  . Total Bilirubin 04/11/2019 0.6  0.2 - 1.2 mg/dL Final  . Alkaline Phosphatase 04/11/2019 51  39 - 117 U/L  Final  . AST 04/11/2019 21  0 - 37 U/L Final  . ALT 04/11/2019 23  0 - 53 U/L Final  . Total Protein 04/11/2019 7.6  6.0 - 8.3 g/dL Final  . Albumin 54/08/8118 4.6  3.5 - 5.2 g/dL Final  . Calcium 14/78/2956 10.3  8.4 - 10.5 mg/dL Final  . GFR 21/30/8657 80.84  >60.00 mL/min Final  . Hgb A1c MFr Bld 04/11/2019 6.7* 4.6 - 6.5 % Final   Glycemic Control Guidelines for People with Diabetes:Non Diabetic:  <6%Goal of Therapy: <7%Additional Action Suggested:  >8%   . Microalb, Ur 04/11/2019 <0.7  0.0 - 1.9 mg/dL Final  . Creatinine,U 84/69/6295 124.5  mg/dL Final  . Microalb Creat Ratio 04/11/2019 0.6  0.0 - 30.0 mg/g Final    Physical Examination:  There were no vitals taken for this visit.    ASSESSMENT:  Diabetes type 2 , nonobese See history of present illness for detailed discussion of current diabetes management, blood sugar patterns and problems identified  He is on Kombiglyze XR and Glyset twice daily  A1c is 6.7 and improved  He is not checking his blood sugars much Likely has some postprandial hyperglycemia which he does not monitor He is  doing some regular exercise Also with his not being as busy is eating less restaurant meals and lower fat intake He will continue to take Glyset and Kombiglyze  Discussed that his 2 isolated episodes of vomiting are unlikely to be related to his Kombiglyze since he has been on this for quite some time   LIPIDS: Adequately controlled without medications and has no other risk factors  HYPERKALEMIA: His potassium was 5.4 which is unexplained and not related to medications, OTC supplements or any significant use of nonsteroidal drugs.  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 Any transcriptional errors that result from this process are unintentional.

## 2019-07-04 ENCOUNTER — Other Ambulatory Visit (HOSPITAL_COMMUNITY)
Admission: RE | Admit: 2019-07-04 | Discharge: 2019-07-04 | Disposition: A | Discharge status: Home / Self Care | Payer: BC Managed Care – PPO | Source: Ambulatory Visit | Attending: Family Medicine | Admitting: Family Medicine

## 2019-07-04 ENCOUNTER — Encounter: Payer: Self-pay | Admitting: Family Medicine

## 2019-07-04 ENCOUNTER — Ambulatory Visit: Payer: BC Managed Care – PPO | Admitting: Family Medicine

## 2019-07-04 ENCOUNTER — Other Ambulatory Visit: Payer: Self-pay

## 2019-07-04 VITALS — BP 126/82 | HR 74 | Temp 98.4°F | Ht 68.0 in | Wt 134.2 lb

## 2019-07-04 DIAGNOSIS — Z113 Encounter for screening for infections with a predominantly sexual mode of transmission: Secondary | ICD-10-CM

## 2019-07-04 DIAGNOSIS — Z5329 Procedure and treatment not carried out because of patient's decision for other reasons: Secondary | ICD-10-CM

## 2019-07-04 DIAGNOSIS — E1165 Type 2 diabetes mellitus with hyperglycemia: Secondary | ICD-10-CM

## 2019-07-04 DIAGNOSIS — Z5320 Procedure and treatment not carried out because of patient's decision for unspecified reasons: Secondary | ICD-10-CM

## 2019-07-04 NOTE — Progress Notes (Signed)
Joe Boyd is a 43 y.o. male is here for follow up.  History of Present Illness:   HPI:   1. Screening for STDs (sexually transmitted diseases) In a new relationship and wants to be safe. No symptoms.  2. Type 2 diabetes mellitus with hyperglycemia, without long-term current use of insulin ([place]) Doing very well. Followed by Endocrinology.  3. Refusal of statin medication by patient Lab Results  Component Value Date   CHOL 110 01/05/2019   HDL 42.20 01/05/2019   LDLCALC 52 01/05/2019   TRIG 77.0 01/05/2019   CHOLHDL 3 01/05/2019   Lab Results  Component Value Date   ALT 13 07/04/2019   AST 14 07/04/2019   ALKPHOS 51 04/11/2019   BILITOT 0.5 07/04/2019   There are no preventive care reminders to display for this patient.   Depression screen [facility] 2/9 10/20/2018 04/19/2017 04/06/2016  Decreased Interest 0 0 0  Down, Depressed, Hopeless 0 0 0  PHQ - 2 Score 0 0 0   PMHx, SurgHx, SocialHx, FamHx, Medications, and Allergies were reviewed in the Visit Navigator and updated as appropriate.   Patient Active Problem List   Diagnosis Date Noted  . Thumb pain, left 11/16/2018  . Lumbar pain with radiation down right leg 11/16/2018  . Refusal of statin medication by patient 10/20/2018  . Type 2 diabetes mellitus with hyperglycemia ([place]) 02/23/2015   Social History   Tobacco Use  . Smoking status: Never Smoker  . Smokeless tobacco: Never Used  Substance Use Topics  . Alcohol use: Yes    Comment: occasionally  . Drug use: No   Current Medications and Allergies   Current Outpatient Medications:  .  acarbose (PRECOSE) 50 MG tablet, TAKE 1 TABLET BY MOUTH THREE TIMES DAILY WITH MEALS. (Patient taking differently: Take 50 mg by mouth 2 (two) times a day. TAKE 1 TABLET BY MOUTH TWO TIMES DAILY WITH MEALS.), Disp: 90 tablet, Rfl: 3 .  atorvastatin (LIPITOR) 10 MG tablet, Take 10 mg by mouth daily. TAKE 1 TABLET BY MOUTH ONCE EVERY 3 DAYS., Disp: , Rfl:  .  glucose blood  (BAYER CONTOUR TEST) test strip, Test once every 3 days., Disp: 100 each, Rfl: 5 .  HYDROcodone-acetaminophen (NORCO/VICODIN) 5-325 MG tablet, Take 1 tablet by mouth every 6 (six) hours as needed for moderate pain., Disp: 20 tablet, Rfl: 0 .  KOMBIGLYZE XR 2.04-999 MG TB24, TAKE 2 TABLETS BY MOUTH DAILY, Disp: 180 tablet, Rfl: 1 .  Multiple Vitamin (MULTIVITAMIN WITH MINERALS) TABS tablet, Take 1 tablet by mouth daily., Disp: , Rfl:  .  Probiotic Product (PROBIOTIC-10) CHEW, Chew 2 capsules by mouth at bedtime., Disp: , Rfl:   No Known Allergies   Review of Systems   Pertinent items are noted in the HPI. Otherwise, a complete ROS is negative.  Vitals   Vitals:   07/04/19 1557  BP: 126/82  Pulse: 74  Temp: 98.4 F (36.9 C)  TempSrc: Oral  SpO2: 97%  Weight: 134 lb 3.2 oz (60.9 kg)  Height: 5\' 8"  (1.727 m)     Body mass index is 20.41 kg/m.  Physical Exam   Physical Exam Vitals signs and nursing note reviewed.  Constitutional:      General: He is not in acute distress.    Appearance: He is well-developed.  HENT:     Head: Normocephalic and atraumatic.     Right Ear: External ear normal.     Left Ear: External ear normal.  Nose: Nose normal.  Eyes:     Conjunctiva/sclera: Conjunctivae normal.     Pupils: Pupils are equal, round, and reactive to light.  Neck:     Musculoskeletal: Normal range of motion and neck supple.  Cardiovascular:     Rate and Rhythm: Normal rate and regular rhythm.     Heart sounds: Normal heart sounds.  Pulmonary:     Effort: Pulmonary effort is normal.     Breath sounds: Normal breath sounds.  Abdominal:     General: Bowel sounds are normal.     Palpations: Abdomen is soft.  Musculoskeletal: Normal range of motion.  Skin:    General: Skin is warm and dry.  Neurological:     Mental Status: He is alert and oriented to person, place, and time.  Psychiatric:        Behavior: Behavior normal.        Thought Content: Thought content  normal.        Judgment: Judgment normal.    Assessment and Plan   Hollie was seen today for diabetes.  Diagnoses and all orders for this visit:  Type 2 diabetes mellitus with hyperglycemia, without long-term current use of insulin (HCC) Comments: Doing well. Continue current treatment. Orders: -     Comprehensive metabolic panel  Screening for STDs (sexually transmitted diseases) Comments: Discussed safe sex. Labs today. Orders: -     HIV Antibody (routine testing w rflx) -     RPR -     Urine cytology ancillary only  Refusal of statin medication by patient Comments: Will continue to monitor.  . Orders and follow up as documented in EpicCare, reviewed diet, exercise and weight control, cardiovascular risk and specific lipid/LDL goals reviewed, reviewed medications and side effects in detail.  . Reviewed expectations re: course of current medical issues. . Outlined signs and symptoms indicating need for more acute intervention. . Patient verbalized understanding and all questions were answered. . Patient received an After Visit Summary.  Helane RimaErica Tesean Stump, DO [place] 07/13/2019

## 2019-07-06 LAB — COMPLETE METABOLIC PANEL WITH GFR
AG Ratio: 1.7 (calc) (ref 1.0–2.5)
ALT: 13 U/L (ref 9–46)
AST: 14 U/L (ref 10–40)
Albumin: 4.5 g/dL (ref 3.6–5.1)
Alkaline phosphatase (APISO): 53 U/L (ref 36–130)
BUN: 17 mg/dL (ref 7–25)
CO2: 24 mmol/L (ref 20–32)
Calcium: 10 mg/dL (ref 8.6–10.3)
Chloride: 105 mmol/L (ref 98–110)
Creat: 1.27 mg/dL (ref 0.60–1.35)
GFR, Est African American: 79 mL/min/{1.73_m2} (ref >=60)
GFR, Est Non African American: 68 mL/min/{1.73_m2} (ref >=60)
Globulin: 2.6 g/dL (calc) (ref 1.9–3.7)
Glucose, Bld: 85 mg/dL (ref 65–99)
Potassium: 4.8 mmol/L (ref 3.5–5.3)
Sodium: 144 mmol/L (ref 135–146)
Total Bilirubin: 0.5 mg/dL (ref 0.2–1.2)
Total Protein: 7.1 g/dL (ref 6.1–8.1)

## 2019-07-06 LAB — RPR: RPR Ser Ql: NONREACTIVE

## 2019-07-06 LAB — TEST AUTHORIZATION

## 2019-07-06 LAB — HIV ANTIBODY (ROUTINE TESTING W REFLEX): HIV 1&2 Ab, 4th Generation: NONREACTIVE

## 2019-07-07 LAB — URINE CYTOLOGY ANCILLARY ONLY
Chlamydia: NEGATIVE
Neisseria Gonorrhea: NEGATIVE
Trichomonas: NEGATIVE

## 2019-07-10 LAB — URINE CYTOLOGY ANCILLARY ONLY: Bacterial vaginitis: NEGATIVE

## 2019-08-21 ENCOUNTER — Other Ambulatory Visit (INDEPENDENT_AMBULATORY_CARE_PROVIDER_SITE_OTHER): Payer: BC Managed Care – PPO

## 2019-08-21 ENCOUNTER — Other Ambulatory Visit: Payer: Self-pay

## 2019-08-21 DIAGNOSIS — E049 Nontoxic goiter, unspecified: Secondary | ICD-10-CM

## 2019-08-21 DIAGNOSIS — E119 Type 2 diabetes mellitus without complications: Secondary | ICD-10-CM

## 2019-08-21 LAB — BASIC METABOLIC PANEL
BUN: 15 mg/dL (ref 6–23)
CO2: 31 mEq/L (ref 19–32)
Calcium: 10 mg/dL (ref 8.4–10.5)
Chloride: 106 mEq/L (ref 96–112)
Creatinine, Ser: 1.08 mg/dL (ref 0.40–1.50)
GFR: 73.85 mL/min (ref >60.00)
Glucose, Bld: 126 mg/dL — ABNORMAL HIGH (ref 70–99)
Potassium: 5.8 mEq/L — ABNORMAL HIGH (ref 3.5–5.1)
Sodium: 142 mEq/L (ref 135–145)

## 2019-08-21 LAB — HEMOGLOBIN A1C: Hgb A1c MFr Bld: 6.6 % — ABNORMAL HIGH (ref 4.6–6.5)

## 2019-08-22 LAB — TSH: TSH: 0.51 u[IU]/mL (ref 0.35–4.50)

## 2019-08-24 ENCOUNTER — Telehealth: Payer: Self-pay | Admitting: Endocrinology

## 2019-08-24 ENCOUNTER — Encounter: Payer: Self-pay | Admitting: Endocrinology

## 2019-08-24 ENCOUNTER — Ambulatory Visit (INDEPENDENT_AMBULATORY_CARE_PROVIDER_SITE_OTHER): Payer: BC Managed Care – PPO | Admitting: Endocrinology

## 2019-08-24 ENCOUNTER — Other Ambulatory Visit: Payer: Self-pay

## 2019-08-24 VITALS — BP 110/70 | HR 70 | Ht 68.0 in | Wt 128.6 lb

## 2019-08-24 DIAGNOSIS — E119 Type 2 diabetes mellitus without complications: Secondary | ICD-10-CM

## 2019-08-24 DIAGNOSIS — E875 Hyperkalemia: Secondary | ICD-10-CM | POA: Diagnosis not present

## 2019-08-24 NOTE — Patient Instructions (Signed)
Check blood sugars on waking up days a week  Also check blood sugars about 2 hours after meals and do this after different meals by rotation  Recommended blood sugar levels on waking up are 90-130 and about 2 hours after meal is 130-160  Please bring your blood sugar monitor to each visit, thank you   

## 2019-08-24 NOTE — Telephone Encounter (Signed)
Pt states Test strips and Lancets is not refilled at [facility] in [place].   glucose blood (BAYER CONTOUR TEST) test strip  [employer]ayer Microlet Lancets lancets  Refill needed. Please advise?  Call pt @ [phone] Thank you!

## 2019-08-24 NOTE — Progress Notes (Signed)
Patient ID: Joe Boyd, male   DOB: 16-Aug-1976, 43 y.o.   MRN: [id number]           Reason for Appointment: Follow-up visit for Type 2 Diabetes   History of Present Illness:          Diagnosis: Type 2 diabetes mellitus, date of diagnosis: 2012        Past history:  At time of diagnosis he was having symptoms of increased thirst and urination and he was seen by his physician when he started feeling weak and dizzy also. His A1c at diagnosis was 12.8. He thinks he was treated with metformin only and also he started changing his diet along with eliminating sweet soft drinks.  Initially was treated with only 500 mg of metformin ER and in 2014 this was increased to 1000 mg.  He has not had any other medications for diabetes. He was last seen by his endocrinologist in [place] in 07/2014 when his A1c was 6.5 and no changes were made A1c of over 7%  in 7/16 and he was started on Kombiglyze XR In 6/17 with his A1c going up to 7.6 he was given Glyset 25 mg at lunch also in addition to [employer]  Recent history:   A1c is 6.6 and relatively better than before       Oral hypoglycemic drugs the patient is taking are:   Kombiglyze XR 2.04/999, 2 tablets daily, Glyset 50 mg at lunch and dinner  Current management, blood sugar patterns and problems identified:  Although he has been advised to check his blood sugars regularly has been only 3 readings in the last month  His weight is significantly lower  He thinks this could be from doing more walking and hiking  He thinks his appetite and food intake is overall about the same  Not clear if some of his readings are before or after meals and recent blood sugar range 97-117  He has no difficulty with taking his Glyset as directed  Takes Kombiglyze in the evenings but he says that in the mornings he tends to have 3 loose stools which has been going on for some time  Lab glucose reading was 126       Side effects from medications have been:  None Compliance with the medical regimen: Fair   Glucose monitoring:  done occasionally,        Glucometer:  Contour      Blood Glucose readings: Recent readings as above  Self-care:  Meals: 3 meals per day. Breakfast is recently skipped Tries to get some protein with his lunch, sometimes yogurt, sometimes has chicken.    Usually avoiding snacks          Exercise: regular walking upto 2 hours         Dietician visit, most recent: 7/16                Weight history:   Wt Readings from Last 3 Encounters:  08/24/19 128 lb 9.6 oz (58.3 kg)  07/04/19 134 lb 3.2 oz (60.9 kg)  01/13/19 134 lb (60.8 kg)    Glycemic control:   Lab Results  Component Value Date   HGBA1C 6.6 (H) 08/21/2019   HGBA1C 6.7 (H) 04/11/2019   HGBA1C 7.1 (H) 01/05/2019   Lab Results  Component Value Date   MICROALBUR <0.7 04/11/2019   LDLCALC 52 01/05/2019   CREATININE 1.08 08/21/2019   Lab on 08/21/2019  Component Date Value Ref Range Status  .  TSH 08/21/2019 0.51  0.35 - 4.50 uIU/mL Final  . Sodium 08/21/2019 142  135 - 145 mEq/L Final  . Potassium 08/21/2019 5.8* 3.5 - 5.1 mEq/L Final  . Chloride 08/21/2019 106  96 - 112 mEq/L Final  . CO2 08/21/2019 31  19 - 32 mEq/L Final  . Glucose, Bld 08/21/2019 126* 70 - 99 mg/dL Final  . BUN 16/10/960408/24/2020 15  6 - 23 mg/dL Final  . Creatinine, Ser 08/21/2019 1.08  0.40 - 1.50 mg/dL Final  . Calcium 54/09/811908/24/2020 10.0  8.4 - 10.5 mg/dL Final  . GFR 14/78/295608/24/2020 73.85  >60.00 mL/min Final  . Hgb A1c MFr Bld 08/21/2019 6.6* 4.6 - 6.5 % Final   Glycemic Control Guidelines for People with Diabetes:Non Diabetic:  <6%Goal of Therapy: <7%Additional Action Suggested:  >8%       Allergies as of 08/24/2019   No Known Allergies     Medication List       Accurate as of August 24, 2019  3:43 PM. If you have any questions, ask your nurse or doctor.        acarbose 50 MG tablet Commonly known as: PRECOSE TAKE 1 TABLET BY MOUTH THREE TIMES DAILY WITH MEALS. What  changed:   how much to take  how to take this  when to take this  additional instructions   atorvastatin 10 MG tablet Commonly known as: LIPITOR Take 10 mg by mouth daily. TAKE 1 TABLET BY MOUTH ONCE EVERY 3 DAYS.   Bayer Microlet Lancets lancets 1 each by Other route every 3 (three) days. Use as instructed to check blood sugar every 3 days.   glucose blood test strip Commonly known as: IT consultantBayer Contour Test Test once every 3 days.   Kombiglyze XR 2.04-999 MG Tb24 Generic drug: Saxagliptin-Metformin TAKE 2 TABLETS BY MOUTH DAILY   multivitamin with minerals Tabs tablet Take 1 tablet by mouth daily.   Probiotic-10 Chew Chew 2 capsules by mouth at bedtime.       Allergies: No Known Allergies  Past Medical History:  Diagnosis Date  . Diabetes mellitus without complication (HCC)   . Tuberculosis    Finished Rx in 8/15    History reviewed. No pertinent surgical history.  Family History  Problem Relation Age of Onset  . Diabetes Mother   . Hypertension Mother   . Thyroid disease Mother   . Diabetes Father   . Hypertension Father   . Cancer Neg Hx   . Heart disease Neg Hx     Social History:  reports that he has never smoked. He has never used smokeless tobacco. He reports current alcohol use. He reports that he does not use drugs.    Review of Systems        Lipids:  LDL and triglycerides previously normal without any pharmacological treatment  He now says that he takes Lipitor every 3 days from his father's prescription and this has not been prescribed to him.      Lab Results  Component Value Date   CHOL 110 01/05/2019   CHOL 152 02/24/2018   CHOL 128 03/08/2017   Lab Results  Component Value Date   HDL 42.20 01/05/2019   HDL 41.30 02/24/2018   HDL 35.40 (L) 03/08/2017   Lab Results  Component Value Date   LDLCALC 52 01/05/2019   LDLCALC 89 02/24/2018   LDLCALC 65 03/08/2017   Lab Results  Component Value Date   TRIG 77.0 01/05/2019    TRIG 108.0 02/24/2018  TRIG 139.0 03/08/2017   Lab Results  Component Value Date   CHOLHDL 3 01/05/2019   CHOLHDL 4 02/24/2018   CHOLHDL 4 03/08/2017   No results found for: LDLDIRECT                 Thyroid: Has History of a small goiter with normal TSH   Lab Results  Component Value Date   TSH 0.51 08/21/2019   HYPERKALEMIA:  His potassium is periodically high In the past he had stated that he would sometimes take lisinopril from his father's prescription but he does not think he is doing this now He is not eating any salt substitutes and has only occasional fruits like bananas or oranges Renal function has been normal  Lab Results  Component Value Date   K 5.8 (H) 08/21/2019     LABS:  Lab on 08/21/2019  Component Date Value Ref Range Status  . TSH 08/21/2019 0.51  0.35 - 4.50 uIU/mL Final  . Sodium 08/21/2019 142  135 - 145 mEq/L Final  . Potassium 08/21/2019 5.8* 3.5 - 5.1 mEq/L Final  . Chloride 08/21/2019 106  96 - 112 mEq/L Final  . CO2 08/21/2019 31  19 - 32 mEq/L Final  . Glucose, Bld 08/21/2019 126* 70 - 99 mg/dL Final  . BUN 08/21/2019 15  6 - 23 mg/dL Final  . Creatinine, Ser 08/21/2019 1.08  0.40 - 1.50 mg/dL Final  . Calcium 08/21/2019 10.0  8.4 - 10.5 mg/dL Final  . GFR 08/21/2019 73.85  >60.00 mL/min Final  . Hgb A1c MFr Bld 08/21/2019 6.6* 4.6 - 6.5 % Final   Glycemic Control Guidelines for People with Diabetes:Non Diabetic:  <6%Goal of Therapy: <7%Additional Action Suggested:  >8%     Physical Examination:  BP 110/70 (BP Location: Left Arm, Patient Position: Sitting, Cuff Size: Normal)   Pulse 70   Ht 5\' 8"  (1.727 m)   Wt 128 lb 9.6 oz (58.3 kg)   SpO2 96%   BMI 19.55 kg/m     ASSESSMENT:  Diabetes type 2 , nonobese See history of present illness for detailed discussion of current diabetes management, blood sugar patterns and problems identified  He is on Kombiglyze XR and Glyset twice daily  A1c is 6.6  He is on the same  regimen of Glyset and Kombiglyze Overall doing well with regular exercise He has lost weight unexpectedly but not clear this is related to his increased physical activity, diet appears to be about the same He has sporadic blood sugars at home which are near normal and lab glucose was 126 fasting  WEIGHT loss: He is subjectively feeling good and not clear if this is abnormal.  TSH is normal   HYPERKALEMIA: His potassium was again high at 5.8 This is not explained by any medications, diet or renal function Not clear if this is related to hemolysis as his potassium was normal in July   PLAN:   Start checking glucose regularly after meals No change in diabetes medicine Repeat potassium today Advised him to watch his weight and if he continues to lose weight he will follow-up with his PCP  Follow-up in 4 months    Erandy Mceachern 08/24/2019, 3:43 PM   Note: This office note was prepared with Dragon voice recognition system technology. Any transcriptional errors that result from this process are unintentional.

## 2019-08-25 ENCOUNTER — Other Ambulatory Visit: Payer: Self-pay

## 2019-08-25 DIAGNOSIS — E119 Type 2 diabetes mellitus without complications: Secondary | ICD-10-CM

## 2019-08-25 LAB — POTASSIUM: Potassium: 4.2 mEq/L (ref 3.5–5.1)

## 2019-08-25 MED ORDER — GLUCOSE BLOOD VI STRP: ORAL_STRIP | 5 refills | Status: DC

## 2019-08-25 MED ORDER — BAYER MICROLET LANCETS MISC: 1.0000 | 2 refills | Status: DC

## 2019-08-25 NOTE — Telephone Encounter (Signed)
Rx sent 

## 2019-09-25 ENCOUNTER — Other Ambulatory Visit: Payer: Self-pay | Admitting: Endocrinology

## 2019-09-25 ENCOUNTER — Telehealth: Payer: Self-pay | Admitting: Family Medicine

## 2019-09-25 ENCOUNTER — Telehealth: Payer: Self-pay | Admitting: Endocrinology

## 2019-09-25 MED ORDER — SAXAGLIPTIN HCL 5 MG PO TABS: 5.0000 mg | ORAL_TABLET | Freq: Every day | ORAL | 2 refills | Status: DC

## 2019-09-25 NOTE — Telephone Encounter (Signed)
Patient states he has been taking Januiva 50 mg and stopped taking KOMBIGLYZE XR 2.04-999 MG TB24 2 weeks ago. States his weight has not been changing and would like to discuss other medications.  Please Advise, Thanks

## 2019-09-25 NOTE — Telephone Encounter (Signed)
Diarrhea less with stopping Kombiglyze.  Will change his prescription to Onglyza 5 mg daily and he will come back in early December for follow-up

## 2019-09-25 NOTE — Telephone Encounter (Signed)
See note

## 2019-09-25 NOTE — Telephone Encounter (Signed)
Please let patient know that I sent a prescription to [facility] since we want to do a trial before doing mail order

## 2019-09-25 NOTE — Telephone Encounter (Signed)
Patient is calling s been taking Januiva 50 mg and stopped taking KOMBIGLYZE XR 2.04-999 MG TB24 2 weeks ago. States his weight is 125lb Wanting a referral for his weight loss. Please Advise,  [place]- [phone]

## 2019-09-25 NOTE — Telephone Encounter (Signed)
Called pt and attempted to give him MD message. Pt stated that Dr. Dwyane Dee already contacted him and told him this information.

## 2019-09-26 NOTE — Telephone Encounter (Signed)
Left message to return call to our office.  Think that wt that is in message is wrong need to verify with patient.

## 2019-10-04 ENCOUNTER — Ambulatory Visit (INDEPENDENT_AMBULATORY_CARE_PROVIDER_SITE_OTHER): Payer: BC Managed Care – PPO | Admitting: Physician Assistant

## 2019-10-04 ENCOUNTER — Encounter: Payer: Self-pay | Admitting: Physician Assistant

## 2019-10-04 ENCOUNTER — Other Ambulatory Visit: Payer: Self-pay

## 2019-10-04 VITALS — BP 140/84 | HR 62 | Temp 98.4°F | Ht 68.0 in | Wt 128.4 lb

## 2019-10-04 DIAGNOSIS — R634 Abnormal weight loss: Secondary | ICD-10-CM

## 2019-10-04 DIAGNOSIS — Z23 Encounter for immunization: Secondary | ICD-10-CM

## 2019-10-04 LAB — COMPREHENSIVE METABOLIC PANEL
ALT: 21 U/L (ref 0–53)
AST: 20 U/L (ref 0–37)
Albumin: 4.7 g/dL (ref 3.5–5.2)
Alkaline Phosphatase: 51 U/L (ref 39–117)
BUN: 13 mg/dL (ref 6–23)
CO2: 30 mEq/L (ref 19–32)
Calcium: 10.7 mg/dL — ABNORMAL HIGH (ref 8.4–10.5)
Chloride: 103 mEq/L (ref 96–112)
Creatinine, Ser: 1.01 mg/dL (ref 0.40–1.50)
GFR: 79.75 mL/min (ref >60.00)
Glucose, Bld: 86 mg/dL (ref 70–99)
Potassium: 4.4 mEq/L (ref 3.5–5.1)
Sodium: 142 mEq/L (ref 135–145)
Total Bilirubin: 1.1 mg/dL (ref 0.2–1.2)
Total Protein: 7.6 g/dL (ref 6.0–8.3)

## 2019-10-04 LAB — CBC WITH DIFFERENTIAL/PLATELET
Basophils Absolute: 0.1 10*3/uL (ref 0.0–0.1)
Basophils Relative: 1.2 % (ref 0.0–3.0)
Eosinophils Absolute: 0 10*3/uL (ref 0.0–0.7)
Eosinophils Relative: 0.1 % (ref 0.0–5.0)
HCT: 38.6 % — ABNORMAL LOW (ref 39.0–52.0)
Hemoglobin: 12 g/dL — ABNORMAL LOW (ref 13.0–17.0)
Lymphocytes Relative: 31.2 % (ref 12.0–46.0)
Lymphs Abs: 2.1 10*3/uL (ref 0.7–4.0)
MCHC: 31 g/dL (ref 30.0–36.0)
MCV: 67.9 fl — ABNORMAL LOW (ref 78.0–100.0)
Monocytes Absolute: 0.4 10*3/uL (ref 0.1–1.0)
Monocytes Relative: 5.6 % (ref 3.0–12.0)
Neutro Abs: 4.2 10*3/uL (ref 1.4–7.7)
Neutrophils Relative %: 61.9 % (ref 43.0–77.0)
Platelets: 205 10*3/uL (ref 150.0–400.0)
RBC: 5.69 Mil/uL (ref 4.22–5.81)
RDW: 15.8 % — ABNORMAL HIGH (ref 11.5–15.5)
WBC: 6.7 10*3/uL (ref 4.0–10.5)

## 2019-10-04 LAB — SEDIMENTATION RATE: Sed Rate: 3 mm/hr (ref 0–15)

## 2019-10-04 LAB — C-REACTIVE PROTEIN: CRP: <1 mg/dL (ref 0.5–20.0)

## 2019-10-04 LAB — TSH: TSH: 0.98 u[IU]/mL (ref 0.35–4.50)

## 2019-10-04 NOTE — Patient Instructions (Signed)
It was great to see you!  We will be in touch with your lab results.  Let's follow-up in 3-4 weeks to see how your weight is doing and we can reassess next steps at that time.  If your abdominal pain gets worse or if it persists, please let me know.  Always have a source of protein with meals and snacks. I want you to try to have at least a total of 5 snack/meals daily.   Take care,  Inda Coke [name]

## 2019-10-04 NOTE — Progress Notes (Signed)
Joe Boyd is a 43 y.o. male here for a new problem.  I acted as a Education administrator for [employer], [name] Anselmo Pickler, LPN  History of Present Illness:   Chief Complaint  Patient presents with  . Weight Loss    HPI   Weight loss Pt c/o weight loss past month. States he is eating but not gaining weight. Diabetes medication was changed to [place] 9/28 due to diarrhea from [place].  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, and he spends a lot of time on his feet but not really doing a lot of aerobic like activities.  He does hike once a week for about an hour and a half.  Denies: Fever, chills, night sweats, abdominal pain, lymph node swelling, concern for STDs, joint pain, rectal bleeding, family history of colon cancer.  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  Wt Readings from Last 5 Encounters:  10/04/19 128 lb 6.1 oz (58.2 kg)  08/24/19 128 lb 9.6 oz (58.3 kg)  07/04/19 134 lb 3.2 oz (60.9 kg)  01/13/19 134 lb (60.8 kg)  11/16/18 136 lb 3.2 oz (61.8 kg)     Past Medical History:  Diagnosis Date  . Diabetes mellitus without complication ([place])   . Tuberculosis    Finished Rx in 8/15     Social History   Socioeconomic History  . Marital status: Divorced    Spouse name: Not on file  . Number of children: 1  . Years of education: 70  . Highest education level: Not on file  Occupational History  . Occupation: Emergency planning/management officer: [employer]  . Financial resource strain: Not on file  . Food insecurity    Worry: Not on file    Inability: Not on file  . Transportation needs    Medical: Not on file    Non-medical: Not on file  Tobacco Use  . Smoking status: Never Smoker   . Smokeless tobacco: Never Used  Substance and Sexual Activity  . Alcohol use: Yes    Comment: occasionally  . Drug use: No  . Sexual activity: Not on file  Lifestyle  . Physical activity    Days per week: Not on file    Minutes per session: Not on file  . Stress: Not on file  Relationships  . Social Herbalist on phone: Not on file    Gets together: Not on file    Attends religious service: Not on file    Active member of club or organization: Not on file    Attends meetings of clubs or organizations: Not on file    Relationship status: Not on file  . Intimate partner violence    Fear of current or ex partner: Not on file    Emotionally abused: Not on file    Physically abused: Not on file    Forced sexual activity: Not on file  Other Topics Concern  . Not on file  Social History Narrative   Born and raised in [place] until the age of 39. Parents live in [place]. Father is a Film/video editor. Divorced. Son is 15. 04/19/2017     History reviewed.  No pertinent surgical history.  Family History  Problem Relation Age of Onset  . Diabetes Mother   . Hypertension Mother   . Thyroid disease Mother   . Diabetes Father   . Hypertension Father   . Cancer Neg Hx   . Heart disease Neg Hx     No Known Allergies  Current Medications:   Current Outpatient Medications:  .  acarbose (PRECOSE) 50 MG tablet, TAKE 1 TABLET BY MOUTH THREE TIMES DAILY WITH MEALS. (Patient taking differently: Take 50 mg by mouth 2 (two) times a day. TAKE 1 TABLET BY MOUTH TWO TIMES DAILY WITH MEALS.), Disp: 90 tablet, Rfl: 3 .  atorvastatin (LIPITOR) 10 MG tablet, Take 10 mg by mouth daily. TAKE 1 TABLET BY MOUTH ONCE EVERY 3 DAYS., Disp: , Rfl:  .  Bayer Microlet Lancets lancets, 1 each by Other route every 3 (three) days. Use as instructed to check blood sugar every 3 days., Disp: 100 each, Rfl: 2 .  glucose blood (BAYER CONTOUR TEST) test strip, Test once every 3 days., Disp: 100 each, Rfl: 5 .   Multiple Vitamin (MULTIVITAMIN WITH MINERALS) TABS tablet, Take 1 tablet by mouth daily., Disp: , Rfl:  .  Probiotic Product (PROBIOTIC-10) CHEW, Chew 2 capsules by mouth at bedtime., Disp: , Rfl:  .  saxagliptin HCl (ONGLYZA) 5 MG TABS tablet, Take 1 tablet (5 mg total) by mouth daily., Disp: 30 tablet, Rfl: 2   Review of Systems:   ROS Negative unless otherwise specified per HPI.  Vitals:   Vitals:   10/04/19 1356  BP: 140/84  Pulse: 62  Temp: 98.4 F (36.9 C)  TempSrc: Temporal  SpO2: 98%  Weight: 128 lb 6.1 oz (58.2 kg)  Height: 5\' 8"  (1.727 m)     Body mass index is 19.52 kg/m.  Physical Exam:   Physical Exam Vitals signs and nursing note reviewed.  Constitutional:      General: He is not in acute distress.    Appearance: He is well-developed. He is not ill-appearing or toxic-appearing.  Cardiovascular:     Rate and Rhythm: Normal rate and regular rhythm.     Pulses: Normal pulses.     Heart sounds: Normal heart sounds, S1 normal and S2 normal.     Comments: No LE edema Pulmonary:     Effort: Pulmonary effort is normal.     Breath sounds: Normal breath sounds.  Abdominal:     General: Abdomen is flat. Bowel sounds are normal.     Palpations: Abdomen is soft.     Tenderness: There is abdominal tenderness in the periumbilical area. There is no guarding or rebound.     Comments: Tenderness to periumbilical/mid right section of abdomen, very mild  Skin:    General: Skin is warm and dry.  Neurological:     Mental Status: He is alert.     GCS: GCS eye subscore is 4. GCS verbal subscore is 5. GCS motor subscore is 6.  Psychiatric:        Speech: Speech normal.        Behavior: Behavior normal. Behavior is cooperative.      Assessment and Plan:   [name] was seen today for weight loss.  Diagnoses and all orders for this visit:  Unintentional weight loss Will check labs.  I do not think that he is eating enough, but this has not been a change for him so I  am uncertain why he has had a weight change recently.  Discussed need for protein intake consistently throughout the day, and to avoid going more than 4 to 5 hours without eating.  Glucerna shakes provided.  I recommend that he follow-up in 3 to 4 weeks, sooner if other concerns.  His abdominal pain was not what brought him here, but I instructed him that if his abdominal pain that was palpated on exam worsens, to seek medical attention for this.  We will reevaluate at next visit as well. -     CBC with Differential/Platelet -     Cancel: HIV Antibody (routine testing w rflx) -     TSH -     Comprehensive metabolic panel -     Sedimentation rate -     C-reactive protein  Need for immunization against influenza -     Flu Vaccine QUAD 36+ mos IM  . Reviewed expectations re: course of current medical issues. . Discussed self-management of symptoms. . Outlined signs and symptoms indicating need for more acute intervention. . Patient verbalized understanding and all questions were answered. . See orders for this visit as documented in the electronic medical record. . Patient received an After-Visit Summary.  CMA or LPN served as scribe during this visit. History, Physical, and Plan performed by medical provider. The above documentation has been reviewed and is accurate and complete.   Jarold Motto, [name]

## 2019-10-05 NOTE — Telephone Encounter (Signed)
Pt was seen by [facility] yesterday about weight.

## 2019-10-06 ENCOUNTER — Other Ambulatory Visit: Payer: Self-pay | Admitting: Physician Assistant

## 2019-10-06 DIAGNOSIS — R710 Precipitous drop in hematocrit: Secondary | ICD-10-CM

## 2019-10-09 ENCOUNTER — Other Ambulatory Visit: Payer: BC Managed Care – PPO

## 2019-10-10 ENCOUNTER — Other Ambulatory Visit (INDEPENDENT_AMBULATORY_CARE_PROVIDER_SITE_OTHER): Payer: BC Managed Care – PPO

## 2019-10-10 DIAGNOSIS — R710 Precipitous drop in hematocrit: Secondary | ICD-10-CM | POA: Diagnosis not present

## 2019-10-10 LAB — FECAL OCCULT BLOOD, IMMUNOCHEMICAL: Fecal Occult Bld: NEGATIVE

## 2019-10-25 ENCOUNTER — Other Ambulatory Visit: Payer: Self-pay

## 2019-10-25 ENCOUNTER — Telehealth: Payer: Self-pay

## 2019-10-25 MED ORDER — SAXAGLIPTIN HCL 5 MG PO TABS: 5.0000 mg | ORAL_TABLET | Freq: Every day | ORAL | 1 refills | Status: DC

## 2019-10-25 NOTE — Telephone Encounter (Signed)
MEDICATION: saxagliptin HCl (ONGLYZA) 5 MG TABS tablet  PHARMACY:  [facility] online   IS THIS A 90 DAY SUPPLY : yes  IS PATIENT OUT OF MEDICATION:   IF NOT; HOW MUCH IS LEFT: 3 pills   LAST APPOINTMENT DATE: @9 /28/2020  NEXT APPOINTMENT DATE:@12 /06/2019  DO WE HAVE YOUR PERMISSION TO LEAVE A DETAILED MESSAGE:  OTHER COMMENTS: please call patient he has some questions about another medication he would like to alternate until refill is filled and sent    **Let patient know to contact pharmacy at the end of the day to make sure medication is ready. **  ** Please notify patient to allow 48-72 hours to process**  **Encourage patient to contact the pharmacy for refills or they can request refills through [facility]**

## 2019-10-25 NOTE — Telephone Encounter (Signed)
Rx sent 

## 2019-10-27 ENCOUNTER — Other Ambulatory Visit: Payer: Self-pay

## 2019-10-27 MED ORDER — SAXAGLIPTIN HCL 5 MG PO TABS: 5.0000 mg | ORAL_TABLET | Freq: Every day | ORAL | 1 refills | Status: DC

## 2019-10-31 ENCOUNTER — Other Ambulatory Visit: Payer: Self-pay

## 2019-11-01 ENCOUNTER — Ambulatory Visit (INDEPENDENT_AMBULATORY_CARE_PROVIDER_SITE_OTHER): Payer: BC Managed Care – PPO | Admitting: Physician Assistant

## 2019-11-01 ENCOUNTER — Encounter: Payer: Self-pay | Admitting: Physician Assistant

## 2019-11-01 VITALS — BP 116/80 | HR 65 | Temp 97.9°F | Ht 68.0 in | Wt 130.4 lb

## 2019-11-01 DIAGNOSIS — R109 Unspecified abdominal pain: Secondary | ICD-10-CM

## 2019-11-01 DIAGNOSIS — Z1211 Encounter for screening for malignant neoplasm of colon: Secondary | ICD-10-CM | POA: Diagnosis not present

## 2019-11-01 DIAGNOSIS — R710 Precipitous drop in hematocrit: Secondary | ICD-10-CM

## 2019-11-01 DIAGNOSIS — R634 Abnormal weight loss: Secondary | ICD-10-CM | POA: Diagnosis not present

## 2019-11-01 NOTE — Progress Notes (Signed)
Joe Boyd is a 43 y.o. male is here for transfer of care.  I acted as a Neurosurgeonscribe for Energy East CorporationSamantha Iridessa Harrow, [name] Corky Mullonna Orphanos, LPN  History of Present Illness:   Chief Complaint  Patient presents with  . Transfer of care    from Dr. Earlene PlaterWallace    HPI   Pt is here today transferring care from Dr. Earlene PlaterWallace.  He is here to discuss his last visit where we found decreased hemoglobin on his blood panel, and we also talked about his unintentional weight loss.  Since he last saw me he has started to increase his protein intake and is drinking Ensure supplements, at least trying to drink 1 a day.  He denies any known rectal bleeding, nausea, vomiting.  He states that his stools fluctuate between diarrhea and constipation and this is not abnormal for him.  He typically has more diarrhea than constipation.  He states that this is never truly been worked up before.  He sometimes gets generalized abdominal pain with this.  He does not donate blood.  He denies family history of colon cancer.  Health Maintenance Due  Topic Date Due  . FOOT EXAM  10/20/2019    Past Medical History:  Diagnosis Date  . Diabetes mellitus without complication (HCC)   . Tuberculosis    Finished Rx in 8/15     Social History   Socioeconomic History  . Marital status: Divorced    Spouse name: Not on file  . Number of children: 1  . Years of education: 716  . Highest education level: Not on file  Occupational History  . Occupation: Ship brokerield Trainer     Employer: [employer]  Social Needs  . Financial resource strain: Not on file  . Food insecurity    Worry: Not on file    Inability: Not on file  . Transportation needs    Medical: Not on file    Non-medical: Not on file  Tobacco Use  . Smoking status: Never Smoker  . Smokeless tobacco: Never Used  Substance and Sexual Activity  . Alcohol use: Yes    Comment: occasionally  . Drug use: No  . Sexual activity: Not on file  Lifestyle  . Physical  activity    Days per week: Not on file    Minutes per session: Not on file  . Stress: Not on file  Relationships  . Social Musicianconnections    Talks on phone: Not on file    Gets together: Not on file    Attends religious service: Not on file    Active member of club or organization: Not on file    Attends meetings of clubs or organizations: Not on file    Relationship status: Not on file  . Intimate partner violence    Fear of current or ex partner: Not on file    Emotionally abused: Not on file    Physically abused: Not on file    Forced sexual activity: Not on file  Other Topics Concern  . Not on file  Social History Narrative   Born and raised in [place] the age of 43. [place]. Father is a Development worker, international aidCardiologist. Divorced. Son is 16. 04/19/2017     History reviewed. No pertinent surgical history.  Family History  Problem Relation Age of Onset  . Diabetes Mother   . Hypertension Mother   . Thyroid disease Mother   . Diabetes Father   . Hypertension Father   .  Cancer Neg Hx   . Heart disease Neg Hx     PMHx, SurgHx, SocialHx, FamHx, Medications, and Allergies were reviewed in the Visit Navigator and updated as appropriate.   Patient Active Problem List   Diagnosis Date Noted  . Thumb pain, left 11/16/2018  . Lumbar pain with radiation down right leg 11/16/2018  . Refusal of statin medication by patient 10/20/2018  . Type 2 diabetes mellitus with hyperglycemia ([place]) 02/23/2015    Social History   Tobacco Use  . Smoking status: Never Smoker  . Smokeless tobacco: Never Used  Substance Use Topics  . Alcohol use: Yes    Comment: occasionally  . Drug use: No    Current Medications and Allergies:    Current Outpatient Medications:  .  acarbose (PRECOSE) 50 MG tablet, TAKE 1 TABLET BY MOUTH THREE TIMES DAILY WITH MEALS. (Patient taking differently: Take 50 mg by mouth 2 (two) times a day. TAKE 1 TABLET BY MOUTH TWO TIMES DAILY WITH MEALS.), Disp: 90 tablet, Rfl:  3 .  atorvastatin (LIPITOR) 10 MG tablet, Take 10 mg by mouth daily. TAKE 1 TABLET BY MOUTH ONCE EVERY 3 DAYS., Disp: , Rfl:  .  Bayer Microlet Lancets lancets, 1 each by Other route every 3 (three) days. Use as instructed to check blood sugar every 3 days., Disp: 100 each, Rfl: 2 .  glucose blood (BAYER CONTOUR TEST) test strip, Test once every 3 days., Disp: 100 each, Rfl: 5 .  Multiple Vitamin (MULTIVITAMIN WITH MINERALS) TABS tablet, Take 1 tablet by mouth daily., Disp: , Rfl:  .  Probiotic Product (PROBIOTIC-10) CHEW, Chew 2 capsules by mouth at bedtime., Disp: , Rfl:  .  saxagliptin HCl (ONGLYZA) 5 MG TABS tablet, Take 1 tablet (5 mg total) by mouth daily., Disp: 90 tablet, Rfl: 1  No Known Allergies  Review of Systems   ROS Negative unless otherwise specified per HPI.  Vitals:   Vitals:   11/01/19 1450  BP: 116/80  Pulse: 65  Temp: 97.9 F (36.6 C)  TempSrc: Temporal  SpO2: 98%  Weight: 130 lb 6.1 oz (59.1 kg)  Height: 5\' 8"  (1.727 m)     Body mass index is 19.82 kg/m.   Physical Exam:    Physical Exam Vitals signs and nursing note reviewed.  Constitutional:      General: He is not in acute distress.    Appearance: He is well-developed. He is not ill-appearing or toxic-appearing.  Cardiovascular:     Rate and Rhythm: Normal rate and regular rhythm.     Pulses: Normal pulses.     Heart sounds: Normal heart sounds, S1 normal and S2 normal.     Comments: No LE edema Pulmonary:     Effort: Pulmonary effort is normal.     Breath sounds: Normal breath sounds.  Abdominal:     General: Abdomen is flat.     Palpations: Abdomen is soft.     Tenderness: There is abdominal tenderness.    Skin:    General: Skin is warm and dry.  Neurological:     Mental Status: He is alert.     GCS: GCS eye subscore is 4. GCS verbal subscore is 5. GCS motor subscore is 6.  Psychiatric:        Speech: Speech normal.        Behavior: Behavior normal. Behavior is cooperative.       Assessment and Plan:    Can was seen today for transfer of care.  Diagnoses and all orders for this visit:  Decreased hemoglobin We will update CBC and add an additional labs for further evaluation of possible anemia.  We are also going to send him for colonoscopy.  Worsening precautions advised. -     Iron, TIBC and Ferritin Panel -     CBC with Differential/Platelet -     B12 and Folate Panel -     REFERRAL TO GI COLONOSCOPY  Unintentional weight loss He currently is up 2 pounds.  Recommended continued adequate protein intake throughout the day, Ensure supplements as needed.  Continue to monitor at each visit.  Abdominal pain I do think that he is dealing with some intermittent constipation and this could be causing his symptoms.  Again we are sending for colonoscopy for further evaluation and treatment of this.  No red flags on exam.  Special screening for malignant neoplasms, colon He is 42 years old and is due for screening. -     REFERRAL TO GI COLONOSCOPY   . Reviewed expectations re: course of current medical issues. . Discussed self-management of symptoms. . Outlined signs and symptoms indicating need for more acute intervention. . Patient verbalized understanding and all questions were answered. . See orders for this visit as documented in the electronic medical record. . Patient received an After Visit Summary.  CMA or LPN served as scribe during this visit. History, Physical, and Plan performed by medical provider. The above documentation has been reviewed and is accurate and complete.   Joe Motto, [name] [place] 11/01/2019  Follow-up: No follow-ups on file.

## 2019-11-01 NOTE — Patient Instructions (Signed)
It was great to see you!  We will be in touch with your lab results.  The gastroenterologist will be in touch with scheduling the colonoscopy.  Let's follow-up after the colonoscopy, sooner if you have concerns.  Take care,  Inda Coke [name]

## 2019-11-02 ENCOUNTER — Encounter: Payer: Self-pay | Admitting: Physician Assistant

## 2019-11-02 LAB — IRON,TIBC AND FERRITIN PANEL
%SAT: 30 % (calc) (ref 20–48)
Ferritin: 18 ng/mL — ABNORMAL LOW (ref 38–380)
Iron: 115 ug/dL (ref 50–180)
TIBC: 382 mcg/dL (calc) (ref 250–425)

## 2019-11-02 LAB — CBC WITH DIFFERENTIAL/PLATELET
Basophils Absolute: 0.1 10*3/uL (ref 0.0–0.1)
Basophils Relative: 1 % (ref 0.0–3.0)
Eosinophils Absolute: 0.1 10*3/uL (ref 0.0–0.7)
Eosinophils Relative: 0.6 % (ref 0.0–5.0)
HCT: 39.9 % (ref 39.0–52.0)
Hemoglobin: 12.8 g/dL — ABNORMAL LOW (ref 13.0–17.0)
Lymphocytes Relative: 32.2 % (ref 12.0–46.0)
Lymphs Abs: 2.6 10*3/uL (ref 0.7–4.0)
MCHC: 32.1 g/dL (ref 30.0–36.0)
MCV: 68.5 fl — ABNORMAL LOW (ref 78.0–100.0)
Monocytes Absolute: 0.5 10*3/uL (ref 0.1–1.0)
Monocytes Relative: 6.6 % (ref 3.0–12.0)
Neutro Abs: 4.7 10*3/uL (ref 1.4–7.7)
Neutrophils Relative %: 59.6 % (ref 43.0–77.0)
Platelets: 201 10*3/uL (ref 150.0–400.0)
RBC: 5.83 Mil/uL — ABNORMAL HIGH (ref 4.22–5.81)
RDW: 15.7 % — ABNORMAL HIGH (ref 11.5–15.5)
WBC: 7.9 10*3/uL (ref 4.0–10.5)

## 2019-11-02 LAB — B12 AND FOLATE PANEL
Folate: 20.8 ng/mL
Vitamin B-12: 315 pg/mL (ref 211–911)

## 2019-11-13 ENCOUNTER — Ambulatory Visit: Payer: BC Managed Care – PPO | Admitting: Physician Assistant

## 2019-11-13 ENCOUNTER — Encounter: Payer: Self-pay | Admitting: Physician Assistant

## 2019-11-13 VITALS — BP 100/62 | HR 74 | Temp 97.9°F | Ht 67.0 in | Wt 132.0 lb

## 2019-11-13 DIAGNOSIS — Z1159 Encounter for screening for other viral diseases: Secondary | ICD-10-CM

## 2019-11-13 DIAGNOSIS — D509 Iron deficiency anemia, unspecified: Secondary | ICD-10-CM | POA: Diagnosis not present

## 2019-11-13 DIAGNOSIS — R634 Abnormal weight loss: Secondary | ICD-10-CM | POA: Diagnosis not present

## 2019-11-13 DIAGNOSIS — K625 Hemorrhage of anus and rectum: Secondary | ICD-10-CM | POA: Diagnosis not present

## 2019-11-13 MED ORDER — NA SULFATE-K SULFATE-MG SULF 17.5-3.13-1.6 GM/177ML PO SOLN: 1.0000 | Freq: Once | ORAL | 0 refills | Status: AC

## 2019-11-13 NOTE — Progress Notes (Signed)
Chief Complaint: Anemia, Abdominal pain, Weight loss  HPI:    Joe Boyd is a 43 year old male with a past medical history of diabetes, who was referred to me by Jarold Motto, PA for a complaint of abdominal pain, weight loss and anemia.      10/04/2019 CMP was normal.  CBC with a hemoglobin of 12, MCV decreased at 67.9.  Iron studies    11/01/2019 with a ferritin low at 18, repeat CBC that day with a hemoglobin of 12.8.    Office visit with PCP 11/01/2019.  At that time described radiating between diarrhea and constipation as well as some generalized abdominal pain.  Had been increasing protein intake and drinking Ensure supplements at least one a day.  At that time patient had increased 2 pounds since his last visit.  Thought that constipation may be giving him some discomfort here and there.  He was referred to [place] for colonoscopy.    Today, the patient presents to clinic and tells me that very occasionally he will see some bright red blood on the toilet paper when wiping, typically a couple times a month, he does not know how long 4 as he usually just ignores this.  Explains that while on Metformin he had some nausea and vomiting as well as diarrhea and reflux for which he was using Ranitidine, but since switching off of this medicine all those symptoms have gone away.  He does continue with some right lower quadrant discomfort "when I press on it".  The first time he noticed this was when he was at his PCPs office and she pressed on it.  Tells me that he chronically radiates from diarrhea to constipation and this is not abnormal for him.  Does describe a weight loss around 13-14 pounds over the past year without trying, most recently has started on Ensure supplement daily and has been able to maintain his weight.    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    Denies fever, chills or symptoms that awaken him from sleep.  Past Medical History:  Diagnosis  Date  . Diabetes mellitus without complication (HCC)   . Tuberculosis    Finished Rx in 8/15    History reviewed. No pertinent surgical history.  Current Outpatient Medications  Medication Sig Dispense Refill  . acarbose (PRECOSE) 50 MG tablet TAKE 1 TABLET BY MOUTH THREE TIMES DAILY WITH MEALS. (Patient taking differently: Take 50 mg by mouth 2 (two) times a day. TAKE 1 TABLET BY MOUTH TWO TIMES DAILY WITH MEALS.) 90 tablet 3  . atorvastatin (LIPITOR) 10 MG tablet Take 10 mg by mouth daily. TAKE 1 TABLET BY MOUTH ONCE EVERY 3 DAYS.    Joe Boyd Microlet Lancets lancets 1 each by Other route every 3 (three) days. Use as instructed to check blood sugar every 3 days. 100 each 2  . glucose blood (BAYER CONTOUR TEST) test strip Test once every 3 days. 100 each 5  . Multiple Vitamin (MULTIVITAMIN WITH MINERALS) TABS tablet Take 1 tablet by mouth daily.    . Probiotic Product (PROBIOTIC-10) CHEW Chew 2 capsules by mouth at bedtime.    . saxagliptin HCl (ONGLYZA) 5 MG TABS tablet Take 1 tablet (5 mg total) by mouth daily. 90 tablet 1   No current facility-administered medications for this visit.     Allergies as of 11/13/2019  . (No Known Allergies)    Family History  Problem Relation Age of Onset  .  Diabetes Mother   . Hypertension Mother   . Thyroid disease Mother   . Diabetes Father   . Hypertension Father   . Breast cancer Sister        4040s  . Cancer Neg Hx   . Heart disease Neg Hx   . Colon cancer Neg Hx     Social History   Socioeconomic History  . Marital status: Divorced    Spouse name: Not on file  . Number of children: 1  . Years of education: 4116  . Highest education level: Not on file  Occupational History  . Occupation: Ship brokerield Trainer     Employer: [employer]  Social Needs  . Financial resource strain: Not on file  . Food insecurity    Worry: Not on file    Inability: Not on file  . Transportation needs    Medical: Not on file    Non-medical: Not on file   Tobacco Use  . Smoking status: Never Smoker  . Smokeless tobacco: Never Used  Substance and Sexual Activity  . Alcohol use: Yes    Comment: occasionally  . Drug use: No  . Sexual activity: Not on file  Lifestyle  . Physical activity    Days per week: Not on file    Minutes per session: Not on file  . Stress: Not on file  Relationships  . Social Musicianconnections    Talks on phone: Not on file    Gets together: Not on file    Attends religious service: Not on file    Active member of club or organization: Not on file    Attends meetings of clubs or organizations: Not on file    Relationship status: Not on file  . Intimate partner violence    Fear of current or ex partner: Not on file    Emotionally abused: Not on file    Physically abused: Not on file    Forced sexual activity: Not on file  Other Topics Concern  . Not on file  Social History Narrative   Born [place] raised in [place] the age of 43. [place]. Father is a Development worker, international aidCardiologist. Divorced. Son is 5416. 04/19/2017     Review of Systems:    Constitutional: No weight loss, fever or chills Skin: No rash Cardiovascular: No chest pain Respiratory: No SOB  Gastrointestinal: See HPI and otherwise negative Genitourinary: No dysuria  Neurological: No headache, dizziness or syncope Musculoskeletal: No new muscle or joint pain Hematologic: No bleeding Psychiatric: No history of depression or anxiety   Physical Exam:  Vital signs: BP 100/62   Pulse 74   Temp 97.9 F (36.6 C)   Ht 5\' 7"  (1.702 m)   Wt 132 lb (59.9 kg)   BMI 20.67 kg/m   Constitutional:   Pleasant male appears to be in NAD, Well developed, Well nourished, alert and cooperative Head:  Normocephalic and atraumatic. Eyes:   PEERL, EOMI. No icterus. Conjunctiva pink. Ears:  Normal auditory acuity. Neck:  Supple Throat: Oral cavity and pharynx without inflammation, swelling or lesion.  Respiratory: Respirations even and unlabored. Lungs clear to  auscultation bilaterally.   No wheezes, crackles, or rhonchi.  Cardiovascular: Normal S1, S2. No MRG. Regular rate and rhythm. No peripheral edema, cyanosis or pallor.  Gastrointestinal:  Soft, nondistended, nontender. No rebound or guarding. Normal bowel sounds. No appreciable masses or hepatomegaly. Rectal:  Not performed.  Msk:  Symmetrical without gross deformities. Without edema, no deformity or joint  abnormality.  Neurologic:  Alert and  oriented x4;  grossly normal neurologically.  Skin:   Dry and intact without significant lesions or rashes. Psychiatric: Demonstrates good judgement and reason without abnormal affect or behaviors.  RELEVANT LABS AND IMAGING: CBC    Component Value Date/Time   WBC 7.9 11/01/2019 1511   RBC 5.83 (H) 11/01/2019 1511   HGB 12.8 (L) 11/01/2019 1511   HCT 39.9 11/01/2019 1511   PLT 201.0 11/01/2019 1511   MCV 68.5 Repeated and verified X2. (L) 11/01/2019 1511   MCHC 32.1 11/01/2019 1511   RDW 15.7 (H) 11/01/2019 1511   LYMPHSABS 2.6 11/01/2019 1511   MONOABS 0.5 11/01/2019 1511   EOSABS 0.1 11/01/2019 1511   BASOSABS 0.1 11/01/2019 1511    CMP     Component Value Date/Time   NA 142 10/04/2019 1448   K 4.4 10/04/2019 1448   CL 103 10/04/2019 1448   CO2 30 10/04/2019 1448   GLUCOSE 86 10/04/2019 1448   BUN 13 10/04/2019 1448   CREATININE 1.01 10/04/2019 1448   CREATININE 1.27 07/04/2019 1621   CALCIUM 10.7 (H) 10/04/2019 1448   PROT 7.6 10/04/2019 1448   ALBUMIN 4.7 10/04/2019 1448   AST 20 10/04/2019 1448   ALT 21 10/04/2019 1448   ALKPHOS 51 10/04/2019 1448   BILITOT 1.1 10/04/2019 1448   GFRNONAA 68 07/04/2019 1621   GFRAA 79 07/04/2019 1621    Assessment: 1.  Iron deficiency anemia: Consider GI source vs other 2.  Weight loss: 13 to 14 pounds in the past year, recently has been holding steady with a protein supplement shake a day; consider malabsorption versus cancer versus lifestyle versus other 3.  Bright red blood per rectum:  Occasionally on the toilet paper 2 times a month; consider relation to hemorrhoids versus other  Plan: 1.  Scheduled patient for an EGD and colonoscopy in the [facility] with Dr. Fuller Plan as he is supervising today.  Did discuss risk, benefits, limitations and alternatives and the patient agrees to proceed. 2.  Discussed that the patient could try Carnation breakfast shakes instead of Ensure as he does not like the flavor of this for calorie supplementation. 3.  Patient to follow in clinic per recommendations from Dr. Fuller Plan after time of procedure.  Ellouise Newer, [name] [facility] 11/13/2019, 2:52 PM  Cc: Inda Coke, [place]

## 2019-11-13 NOTE — Progress Notes (Signed)
Reviewed and agree with management plan.  Malcolm T. Stark, MD FACG [facility]  

## 2019-11-13 NOTE — Patient Instructions (Addendum)
If you are age 45 or older, your body mass index should be between 23-30. Your Body mass index is 20.67 kg/m. If this is out of the aforementioned range listed, please consider follow up with your Primary Care Provider.  If you are age 22 or younger, your body mass index should be between 19-25. Your Body mass index is 20.67 kg/m. If this is out of the aformentioned range listed, please consider follow up with your Primary Care Provider.   You have been scheduled for an endoscopy and colonoscopy. Please follow the written instructions given to you at your visit today. Please pick up your prep supplies at the pharmacy within the next 1-3 days. If you use inhalers (even only as needed), please bring them with you on the day of your procedure.

## 2019-11-15 ENCOUNTER — Encounter: Payer: Self-pay | Admitting: Gastroenterology

## 2019-11-20 ENCOUNTER — Encounter: Payer: Self-pay | Admitting: Gastroenterology

## 2019-11-22 ENCOUNTER — Other Ambulatory Visit: Payer: Self-pay | Admitting: Gastroenterology

## 2019-11-24 LAB — SARS CORONAVIRUS 2 (TAT 6-24 HRS): SARS Coronavirus 2: NEGATIVE

## 2019-11-28 ENCOUNTER — Other Ambulatory Visit: Payer: Self-pay

## 2019-11-28 ENCOUNTER — Encounter: Payer: Self-pay | Admitting: Gastroenterology

## 2019-11-28 ENCOUNTER — Ambulatory Visit (AMBULATORY_SURGERY_CENTER): Payer: BC Managed Care – PPO | Admitting: Gastroenterology

## 2019-11-28 VITALS — BP 114/73 | HR 68 | Temp 98.5°F | Resp 13 | Ht 67.0 in | Wt 132.0 lb

## 2019-11-28 DIAGNOSIS — K640 First degree hemorrhoids: Secondary | ICD-10-CM

## 2019-11-28 DIAGNOSIS — R634 Abnormal weight loss: Secondary | ICD-10-CM | POA: Diagnosis not present

## 2019-11-28 DIAGNOSIS — D509 Iron deficiency anemia, unspecified: Secondary | ICD-10-CM | POA: Diagnosis not present

## 2019-11-28 DIAGNOSIS — K449 Diaphragmatic hernia without obstruction or gangrene: Secondary | ICD-10-CM

## 2019-11-28 DIAGNOSIS — K921 Melena: Secondary | ICD-10-CM

## 2019-11-28 DIAGNOSIS — D508 Other iron deficiency anemias: Secondary | ICD-10-CM | POA: Diagnosis not present

## 2019-11-28 MED ORDER — SODIUM CHLORIDE 0.9 % IV SOLN: 500.0000 mL | Freq: Once | INTRAVENOUS | Status: DC

## 2019-11-28 MED ORDER — FERROUS SULFATE 325 (65 FE) MG PO TABS: 325.0000 mg | ORAL_TABLET | Freq: Two times a day (BID) | ORAL | 2 refills | Status: DC

## 2019-11-28 NOTE — Op Note (Signed)
[facility] Patient Name: Joe Boyd Procedure Date: 11/28/2019 2:41 PM MRN: [id number] Endoscopist: Ladene Artist , MD Age: 43 Referring MD:  Date of Birth: 1976/02/02 Gender: Male Account #: [id number] Procedure:                Upper GI endoscopy Indications:              Iron deficiency anemia, Weight loss Medicines:                Monitored Anesthesia Care Procedure:                Pre-Anesthesia Assessment:                           - Prior to the procedure, a History and Physical                            was performed, and patient medications and                            allergies were reviewed. The patient's tolerance of                            previous anesthesia was also reviewed. The risks                            and benefits of the procedure and the sedation                            options and risks were discussed with the patient.                            All questions were answered, and informed consent                            was obtained. Prior Anticoagulants: The patient has                            taken no previous anticoagulant or antiplatelet                            agents. ASA Grade Assessment: II - A patient with                            mild systemic disease. After reviewing the risks                            and benefits, the patient was deemed in                            satisfactory condition to undergo the procedure.                           After obtaining informed consent, the endoscope was  passed under direct vision. Throughout the                            procedure, the patient's blood pressure, pulse, and                            oxygen saturations were monitored continuously. The                            Endoscope was introduced through the mouth, and                            advanced to the second part of duodenum. The upper                            GI endoscopy was  accomplished without difficulty.                            The patient tolerated the procedure well. Scope In: Scope Out: Findings:                 The examined esophagus was normal.                           A small hiatal hernia was present.                           The exam of the stomach was otherwise normal.                            Biopsies for histology, H pylori were taken with a                            cold forceps.                           The duodenal bulb and second portion of the                            duodenum were normal. Biopsies for histology were                            taken with a cold forceps for evaluation of celiac                            disease. Complications:            No immediate complications. Estimated Blood Loss:     Estimated blood loss was minimal. Impression:               - Normal esophagus.                           - Small hiatal hernia.                           -  Otherwise normal stomach. Biopsied.                           - Normal duodenal bulb and second portion of the                            duodenum. Biopsied. Recommendation:           - Patient has a contact number available for                            emergencies. The signs and symptoms of potential                            delayed complications were discussed with the                            patient. Return to normal activities tomorrow.                            Written discharge instructions were provided to the                            patient.                           - Resume previous diet.                           - Continue present medications.                           - FeSO4 325 mg po bid with meals, #60, 2 refills.                           - Await pathology results.                           - Consider VCE if biopsies unrevealing.                           - GI office visit in 1 month with me or JL. Meryl Dare, MD 11/28/2019 3:22:15  PM This report has been signed electronically.

## 2019-11-28 NOTE — Op Note (Signed)
[name] Patient Name: Joe Boyd Procedure Date: 11/28/2019 2:43 PM MRN: [id number] Endoscopist: Ladene Artist , MD Age: 43 Referring MD:  Date of Birth: 10-19-1976 Gender: Male Account #: [id number] Procedure:                Colonoscopy Indications:              Hematochezia, Iron deficiency anemia, Weight loss Medicines:                Monitored Anesthesia Care Procedure:                Pre-Anesthesia Assessment:                           - Prior to the procedure, a History and Physical                            was performed, and patient medications and                            allergies were reviewed. The patient's tolerance of                            previous anesthesia was also reviewed. The risks                            and benefits of the procedure and the sedation                            options and risks were discussed with the patient.                            All questions were answered, and informed consent                            was obtained. Prior Anticoagulants: The patient has                            taken no previous anticoagulant or antiplatelet                            agents. ASA Grade Assessment: II - A patient with                            mild systemic disease. After reviewing the risks                            and benefits, the patient was deemed in                            satisfactory condition to undergo the procedure.                           After obtaining informed consent, the colonoscope  was passed under direct vision. Throughout the                            procedure, the patient's blood pressure, pulse, and                            oxygen saturations were monitored continuously. The                            Colonoscope was introduced through the anus and                            advanced to the the cecum, identified by                            appendiceal orifice  and ileocecal valve. The                            terminal ileum, ileocecal valve, appendiceal                            orifice, and rectum were photographed. The quality                            of the bowel preparation was excellent. The                            colonoscopy was performed without difficulty. The                            patient tolerated the procedure well. Scope In: 2:52:33 PM Scope Out: 3:05:32 PM Scope Withdrawal Time: 0 hours 9 minutes 54 seconds  Total Procedure Duration: 0 hours 12 minutes 59 seconds  Findings:                 The perianal and digital rectal examinations were                            normal.                           The terminal ileum appeared normal.                           Internal hemorrhoids were found during                            retroflexion. The hemorrhoids were small and Grade                            I (internal hemorrhoids that do not prolapse).                           The exam was otherwise without abnormality on  direct and retroflexion views. Complications:            No immediate complications. Estimated blood loss:                            None. Estimated Blood Loss:     Estimated blood loss: none. Impression:               - The examined portion of the ileum was normal.                           - Internal hemorrhoids.                           - The examination was otherwise normal on direct                            and retroflexion views.                           - No specimens collected. Recommendation:           - Repeat colonoscopy in 10 years for screening                            purposes.                           - Patient has a contact number available for                            emergencies. The signs and symptoms of potential                            delayed complications were discussed with the                            patient. Return to normal  activities tomorrow.                            Written discharge instructions were provided to the                            patient.                           - Resume previous diet.                           - Continue present medications. Meryl Dare, MD 11/28/2019 3:17:43 PM This report has been signed electronically.

## 2019-11-28 NOTE — Patient Instructions (Signed)
Please read handouts provided. Continue present medications. Await pathology results. GI office visit in 1 month. FeSO4 325 mg twice daily with meals.     YOU HAD AN ENDOSCOPIC PROCEDURE TODAY AT [facility]:   Refer to the procedure report that was given to you for any specific questions about what was found during the examination.  If the procedure report does not answer your questions, please call your gastroenterologist to clarify.  If you requested that your care partner not be given the details of your procedure findings, then the procedure report has been included in a sealed envelope for you to review at your convenience later.  YOU SHOULD EXPECT: Some feelings of bloating in the abdomen. Passage of more gas than usual.  Walking can help get rid of the air that was put into your GI tract during the procedure and reduce the bloating. If you had a lower endoscopy (such as a colonoscopy or flexible sigmoidoscopy) you may notice spotting of blood in your stool or on the toilet paper. If you underwent a bowel prep for your procedure, you may not have a normal bowel movement for a few days.  Please Note:  You might notice some irritation and congestion in your nose or some drainage.  This is from the oxygen used during your procedure.  There is no need for concern and it should clear up in a day or so.  SYMPTOMS TO REPORT IMMEDIATELY:   Following lower endoscopy (colonoscopy or flexible sigmoidoscopy):  Excessive amounts of blood in the stool  Significant tenderness or worsening of abdominal pains  Swelling of the abdomen that is new, acute  Fever of 100F or higher   Following upper endoscopy (EGD)  Vomiting of blood or coffee ground material  New chest pain or pain under the shoulder blades  Painful or persistently difficult swallowing  New shortness of breath  Fever of 100F or higher  Black, tarry-looking stools  For urgent or emergent issues, a  gastroenterologist can be reached at any hour by calling [phone].   DIET:  We do recommend a small meal at first, but then you may proceed to your regular diet.  Drink plenty of fluids but you should avoid alcoholic beverages for 24 hours.  ACTIVITY:  You should plan to take it easy for the rest of today and you should NOT DRIVE or use heavy machinery until tomorrow (because of the sedation medicines used during the test).    FOLLOW UP: Our staff will call the number listed on your records 48-72 hours following your procedure to check on you and address any questions or concerns that you may have regarding the information given to you following your procedure. If we do not reach you, we will leave a message.  We will attempt to reach you two times.  During this call, we will ask if you have developed any symptoms of COVID 19. If you develop any symptoms (ie: fever, flu-like symptoms, shortness of breath, cough etc.) before then, please call [phone].  If you test positive for Covid 19 in the 2 weeks post procedure, please call and report this information to [place].    If any biopsies were taken you will be contacted by phone or by letter within the next 1-3 weeks.  Please call us at [phone] if you have not heard about the biopsies in 3 weeks.    SIGNATURES/CONFIDENTIALITY: You and/or your care partner have signed paperwork which will be entered  into your electronic medical record.  These signatures attest to the fact that that the information above on your After Visit Summary has been reviewed and is understood.  Full responsibility of the confidentiality of this discharge information lies with you and/or your care-partner.

## 2019-11-28 NOTE — Progress Notes (Signed)
Pt's states no medical or surgical changes since previsit or office visit.  AG IV, JB temp and CW vitals.

## 2019-11-28 NOTE — Progress Notes (Signed)
Report to PACU, RN, vss, BBS= Clear.  

## 2019-11-30 ENCOUNTER — Telehealth: Payer: Self-pay

## 2019-11-30 NOTE — Telephone Encounter (Signed)
Covid-19 screening questions   Do you now or have you had a fever in the last 14 days? No.  Do you have any respiratory symptoms of shortness of breath or cough now or in the last 14 days? No.  Do you have any family members or close contacts with diagnosed or suspected Covid-19 in the past 14 days? No.  Have you been tested for Covid-19 and found to be positive? No.       Follow up Call-  Call back number 11/28/2019  Post procedure Call Back phone  # [phone]  Permission to leave phone message Yes  Some recent data might be hidden     Patient questions:  Do you have a fever, pain , or abdominal swelling? No. Pain Score  0 *  Have you tolerated food without any problems? Yes.    Have you been able to return to your normal activities? Yes.    Do you have any questions about your discharge instructions: Diet   No. Medications  No. Follow up visit  No.  Do you have questions or concerns about your Care? No.  Actions: * If pain score is 4 or above: No action needed, pain <4.

## 2019-12-04 ENCOUNTER — Other Ambulatory Visit: Payer: BC Managed Care – PPO

## 2019-12-04 ENCOUNTER — Ambulatory Visit: Payer: BC Managed Care – PPO | Admitting: Endocrinology

## 2019-12-05 ENCOUNTER — Encounter: Payer: Self-pay | Admitting: Gastroenterology

## 2019-12-11 ENCOUNTER — Ambulatory Visit: Payer: BC Managed Care – PPO | Admitting: Endocrinology

## 2020-01-02 ENCOUNTER — Other Ambulatory Visit: Payer: BC Managed Care – PPO

## 2020-01-04 ENCOUNTER — Ambulatory Visit: Payer: BC Managed Care – PPO | Admitting: Endocrinology

## 2020-01-08 ENCOUNTER — Ambulatory Visit: Payer: BC Managed Care – PPO | Admitting: Family Medicine

## 2020-01-09 ENCOUNTER — Other Ambulatory Visit: Payer: BC Managed Care – PPO

## 2020-01-10 ENCOUNTER — Other Ambulatory Visit: Payer: Self-pay

## 2020-01-10 ENCOUNTER — Other Ambulatory Visit (INDEPENDENT_AMBULATORY_CARE_PROVIDER_SITE_OTHER): Payer: Self-pay

## 2020-01-10 DIAGNOSIS — E119 Type 2 diabetes mellitus without complications: Secondary | ICD-10-CM

## 2020-01-10 LAB — BASIC METABOLIC PANEL
BUN: 14 mg/dL (ref 6–23)
CO2: 32 mEq/L (ref 19–32)
Calcium: 9.7 mg/dL (ref 8.4–10.5)
Chloride: 102 mEq/L (ref 96–112)
Creatinine, Ser: 1.09 mg/dL (ref 0.40–1.50)
GFR: 72.94 mL/min (ref >60.00)
Glucose, Bld: 112 mg/dL — ABNORMAL HIGH (ref 70–99)
Potassium: 4.7 mEq/L (ref 3.5–5.1)
Sodium: 139 mEq/L (ref 135–145)

## 2020-01-10 LAB — HEMOGLOBIN A1C: Hgb A1c MFr Bld: 7.1 % — ABNORMAL HIGH (ref 4.6–6.5)

## 2020-01-12 ENCOUNTER — Other Ambulatory Visit: Payer: Self-pay

## 2020-01-16 ENCOUNTER — Encounter: Payer: Self-pay | Admitting: Endocrinology

## 2020-01-16 ENCOUNTER — Other Ambulatory Visit: Payer: Self-pay

## 2020-01-16 ENCOUNTER — Ambulatory Visit (INDEPENDENT_AMBULATORY_CARE_PROVIDER_SITE_OTHER): Payer: 59 | Admitting: Endocrinology

## 2020-01-16 ENCOUNTER — Telehealth: Payer: Self-pay

## 2020-01-16 VITALS — BP 100/50 | HR 81 | Ht 67.0 in | Wt 140.4 lb

## 2020-01-16 DIAGNOSIS — T50905D Adverse effect of unspecified drugs, medicaments and biological substances, subsequent encounter: Secondary | ICD-10-CM | POA: Diagnosis not present

## 2020-01-16 DIAGNOSIS — E1165 Type 2 diabetes mellitus with hyperglycemia: Secondary | ICD-10-CM

## 2020-01-16 MED ORDER — SAXAGLIPTIN HCL 5 MG PO TABS: 5.0000 mg | ORAL_TABLET | Freq: Every day | ORAL | 1 refills | Status: DC

## 2020-01-16 NOTE — Progress Notes (Signed)
Patient ID: Joe Boyd, male   DOB: 1976/10/08, 44 y.o.   MRN: [id number]           Reason for Appointment: Follow-up visit for Type 2 Diabetes   History of Present Illness:          Diagnosis: Type 2 diabetes mellitus, date of diagnosis: 2012        Past history:  At time of diagnosis he was having symptoms of increased thirst and urination and he was seen by his physician when he started feeling weak and dizzy also. His A1c at diagnosis was 12.8. He thinks he was treated with metformin only and also he started changing his diet along with eliminating sweet soft drinks.  Initially was treated with only 500 mg of metformin ER and in 2014 this was increased to 1000 mg.  He has not had any other medications for diabetes. He was last seen by his endocrinologist in [place] in 07/2014 when his A1c was 6.5 and no changes were made A1c of over 7%  in 7/16 and he was started on Kombiglyze XR In 6/17 with his A1c going up to 7.6 he was given Glyset 25 mg at lunch also in addition to [employer]  Recent history:   A1c is 7.1, previously 6.6       Oral hypoglycemic drugs the patient is taking are:   Onglyza 5 mg daily, Acarbose 50 mg at lunch and dinner  Current management, blood sugar patterns and problems identified:  He has not been seen in follow-up since 8/20  He had called after his last visit stating that he was having diarrhea and some weight loss  His Metformin was stopped and he is only on Onglyza along with his acarbose  As before he checks his blood sugars very sporadically and only 3 times in the last month, not clear if his test strips are out of date  Usually has higher postprandial readings  Lab glucose was 112 fasting  He has regained most of the weight he has lost  Even though he has a treadmill at home in the last month he has not been doing any exercise because of family issues  Also likely he was not following his diet mostly December because of his father  being hospitalized  Now he is trying to eat some breakfast on Sundays which will be mostly carbohydrate but does not take acarbose at this time       Side effects from medications have been: None Compliance with the medical regimen: Fair   Glucose monitoring:  done occasionally,        Glucometer:  Contour      Blood Glucose readings: Recent readings between 113 and 129 with readings midmorning or afternoon  Self-care:  Meals: 2-3 meals per day.  Tries to get some protein with his lunch, sometimes yogurt, sometimes has chicken.    Usually avoiding snacks                  Dietician visit, most recent: 7/16                Weight history:   Wt Readings from Last 3 Encounters:  01/16/20 140 lb 6.4 oz (63.7 kg)  11/28/19 132 lb (59.9 kg)  11/13/19 132 lb (59.9 kg)    Glycemic control:   Lab Results  Component Value Date   HGBA1C 7.1 (H) 01/10/2020   HGBA1C 6.6 (H) 08/21/2019   HGBA1C 6.7 (H) 04/11/2019  Lab Results  Component Value Date   MICROALBUR <0.7 04/11/2019   LDLCALC 52 01/05/2019   CREATININE 1.09 01/10/2020   Lab on 01/10/2020  Component Date Value Ref Range Status  . Sodium 01/10/2020 139  135 - 145 mEq/L Final  . Potassium 01/10/2020 4.7  3.5 - 5.1 mEq/L Final  . Chloride 01/10/2020 102  96 - 112 mEq/L Final  . CO2 01/10/2020 32  19 - 32 mEq/L Final  . Glucose, Bld 01/10/2020 112* 70 - 99 mg/dL Final  . BUN 95/62/1308 14  6 - 23 mg/dL Final  . Creatinine, Ser 01/10/2020 1.09  0.40 - 1.50 mg/dL Final  . GFR 65/78/4696 72.94  >60.00 mL/min Final  . Calcium 01/10/2020 9.7  8.4 - 10.5 mg/dL Final  . Hgb E9B MFr Bld 01/10/2020 7.1* 4.6 - 6.5 % Final   Glycemic Control Guidelines for People with Diabetes:Non Diabetic:  <6%Goal of Therapy: <7%Additional Action Suggested:  >8%       Allergies as of 01/16/2020   No Known Allergies     Medication List       Accurate as of January 16, 2020  3:16 PM. If you have any questions, ask your nurse or doctor.         acarbose 50 MG tablet Commonly known as: PRECOSE TAKE 1 TABLET BY MOUTH THREE TIMES DAILY WITH MEALS. What changed:   how much to take  how to take this  when to take this  additional instructions   atorvastatin 10 MG tablet Commonly known as: LIPITOR Take 10 mg by mouth daily. TAKE 1 TABLET BY MOUTH ONCE EVERY 3 DAYS.   Bayer Microlet Lancets lancets 1 each by Other route every 3 (three) days. Use as instructed to check blood sugar every 3 days.   ferrous sulfate 325 (65 FE) MG tablet Take 1 tablet (325 mg total) by mouth 2 (two) times daily with a meal.   glucose blood test strip Commonly known as: IT consultant Test once every 3 days.   multivitamin with minerals Tabs tablet Take 1 tablet by mouth daily.   Probiotic-10 Chew Chew 2 capsules by mouth at bedtime.   saxagliptin HCl 5 MG Tabs tablet Commonly known as: Onglyza Take 1 tablet (5 mg total) by mouth daily.       Allergies: No Known Allergies  Past Medical History:  Diagnosis Date  . Diabetes mellitus without complication (HCC)   . Tuberculosis    Finished Rx in 8/15    History reviewed. No pertinent surgical history.  Family History  Problem Relation Age of Onset  . Diabetes Mother   . Hypertension Mother   . Thyroid disease Mother   . Diabetes Father   . Hypertension Father   . Breast cancer Sister        39s  . Cancer Neg Hx   . Heart disease Neg Hx   . Colon cancer Neg Hx     Social History:  reports that he has never smoked. He has never used smokeless tobacco. He reports current alcohol use. He reports that he does not use drugs.    Review of Systems        Lipids:  LDL and triglycerides previously normal without any pharmacological treatment  He now says that he takes Lipitor every 3 days from his father's prescription and this has not been prescribed to him.      Lab Results  Component Value Date   CHOL 110 01/05/2019  CHOL 152 02/24/2018   CHOL 128  03/08/2017   Lab Results  Component Value Date   HDL 42.20 01/05/2019   HDL 41.30 02/24/2018   HDL 35.40 (L) 03/08/2017   Lab Results  Component Value Date   LDLCALC 52 01/05/2019   LDLCALC 89 02/24/2018   LDLCALC 65 03/08/2017   Lab Results  Component Value Date   TRIG 77.0 01/05/2019   TRIG 108.0 02/24/2018   TRIG 139.0 03/08/2017   Lab Results  Component Value Date   CHOLHDL 3 01/05/2019   CHOLHDL 4 02/24/2018   CHOLHDL 4 03/08/2017   No results found for: LDLDIRECT                 Thyroid: Has History of a small goiter with normal TSH   Lab Results  Component Value Date   TSH 0.98 10/04/2019   HYPERKALEMIA:  His potassium is periodically high Does not appear to be diet related and potassium was normal after repeating the test on his last visit Renal function has been normal  Lab Results  Component Value Date   K 4.7 01/10/2020     LABS:  Lab on 01/10/2020  Component Date Value Ref Range Status  . Sodium 01/10/2020 139  135 - 145 mEq/L Final  . Potassium 01/10/2020 4.7  3.5 - 5.1 mEq/L Final  . Chloride 01/10/2020 102  96 - 112 mEq/L Final  . CO2 01/10/2020 32  19 - 32 mEq/L Final  . Glucose, Bld 01/10/2020 112* 70 - 99 mg/dL Final  . BUN 03/47/4259 14  6 - 23 mg/dL Final  . Creatinine, Ser 01/10/2020 1.09  0.40 - 1.50 mg/dL Final  . GFR 56/38/7564 72.94  >60.00 mL/min Final  . Calcium 01/10/2020 9.7  8.4 - 10.5 mg/dL Final  . Hgb P3I MFr Bld 01/10/2020 7.1* 4.6 - 6.5 % Final   Glycemic Control Guidelines for People with Diabetes:Non Diabetic:  <6%Goal of Therapy: <7%Additional Action Suggested:  >8%     Physical Examination:  BP (!) 100/50 (BP Location: Left Arm, Patient Position: Sitting, Cuff Size: Normal)   Pulse 81   Ht 5\' 7"  (1.702 m)   Wt 140 lb 6.4 oz (63.7 kg)   SpO2 98%   BMI 21.99 kg/m     ASSESSMENT:  Diabetes type 2 , nonobese See history of present illness for detailed discussion of current diabetes management, blood  sugar patterns and problems identified  He is on Onglyza and Glyset twice daily  A1c is 7.1, previously 6.6  He is having somewhat higher readings but difficult to confirm as he does not monitor at home Blood sugars are not much higher with stopping Metformin and he has less GI side effects and weight loss without this However diet and exercise regimen has not been good in the last month or so  Intolerance to Metformin: He was having apparently diarrhea and weight loss with this, no etiology is otherwise found on his colonoscopy However not clear if he may be able to tolerate a smaller dose in the future  HYPERKALEMIA: No recurrence now   PLAN:   Start taking Precose at breakfast also when he is eating a meal in the morning Start checking readings after at least breakfast and lunch more consistently If A1c does not improve we will need to be tried on an SGLT2 drug Metformin may not be as useful especially if his fasting readings are not high  He will walk on the treadmill at home regularly  Avoid high carbohydrate meals and make sure he has some protein with each meal especially breakfast  Follow-up in 3 months  Given him information on registering for the Covid vaccine  Reather Littler 01/16/2020, 3:16 PM   Note: This office note was prepared with Dragon voice recognition system technology. Any transcriptional errors that result from this process are unintentional.

## 2020-01-16 NOTE — Telephone Encounter (Signed)
MEDICATION: saxagliptin HCl (ONGLYZA) 5 MG TABS tablet  PHARMACY:  [facility] - [employer] [place], [place] - [address]  IS THIS A 90 DAY SUPPLY : yes  IS PATIENT OUT OF MEDICATION:   IF NOT; HOW MUCH IS LEFT:   LAST APPOINTMENT DATE: @10 /30/2020  NEXT APPOINTMENT DATE:@1 /19/2021  DO WE HAVE YOUR PERMISSION TO LEAVE A DETAILED MESSAGE:  OTHER COMMENTS:    **Let patient know to contact pharmacy at the end of the day to make sure medication is ready. **  ** Please notify patient to allow 48-72 hours to process**  **Encourage patient to contact the pharmacy for refills or they can request refills through [facility]**

## 2020-01-16 NOTE — Telephone Encounter (Signed)
Rx sent 

## 2020-01-16 NOTE — Patient Instructions (Addendum)
Check blood sugars on waking up 1 days a week  Also check blood sugars about 2 hours after meals and do this after different meals by rotation  Recommended blood sugar levels on waking up are 90-130 and about 2 hours after meal is 130-160  Please bring your blood sugar monitor to each visit, thank you  Acarbose at Bfst also  You may register to get the vaccine at either of the 2 options:  [facility] health department website   [email or web address]  Telephone number: [phone], Option 2   Also can register at the [facility] health website, wait listing may be an option  [email or web address] or call [phone]

## 2020-01-19 ENCOUNTER — Other Ambulatory Visit: Payer: Self-pay

## 2020-01-19 ENCOUNTER — Telehealth: Payer: Self-pay

## 2020-01-19 MED ORDER — SITAGLIPTIN PHOSPHATE 100 MG PO TABS: 100.0000 mg | ORAL_TABLET | Freq: Every day | ORAL | 1 refills | Status: DC

## 2020-01-19 NOTE — Telephone Encounter (Signed)
PA is required for Onglyza 5mg  tablets.  Alternative is Januvia or Janument/Janumet XR. Would you like to change it to one of these or does pt have contraindications to these?

## 2020-01-19 NOTE — Telephone Encounter (Signed)
Rx sent. Will call pt and notify him.

## 2020-01-19 NOTE — Telephone Encounter (Signed)
Change to Januvia 100 mg daily

## 2020-01-19 NOTE — Telephone Encounter (Signed)
Called pt and informed him of the medication change. Pt verbalized understanding. 

## 2020-01-30 ENCOUNTER — Other Ambulatory Visit: Payer: Self-pay

## 2020-01-30 ENCOUNTER — Telehealth: Payer: Self-pay | Admitting: Endocrinology

## 2020-01-30 MED ORDER — ACARBOSE 50 MG PO TABS: 50.0000 mg | ORAL_TABLET | Freq: Every day | ORAL | 1 refills | Status: DC

## 2020-01-30 NOTE — Telephone Encounter (Signed)
Rx sent 

## 2020-01-30 NOTE — Telephone Encounter (Signed)
MEDICATION: acarbose (PRECOSE) 50 MG tablet  PHARMACY:   [facility] ([place] - [place], [place] - [address] [place] Phone:  [phone]  Fax:  [phone]      IS THIS A 90 DAY SUPPLY : Yes  IS PATIENT OUT OF MEDICATION: No  IF NOT; HOW MUCH IS LEFT: 7 tablets  LAST APPOINTMENT DATE: @1 /22/2021  NEXT APPOINTMENT DATE:@4 /19/2021  DO WE HAVE YOUR PERMISSION TO LEAVE A DETAILED MESSAGE: Yes  OTHER COMMENTS:  Patient has new insurance-[employer]   **Let patient know to contact pharmacy at the end of the day to make sure medication is ready. **  ** Please notify patient to allow 48-72 hours to process**  **Encourage patient to contact the pharmacy for refills or they can request refills through [facility]**

## 2020-04-15 ENCOUNTER — Other Ambulatory Visit (INDEPENDENT_AMBULATORY_CARE_PROVIDER_SITE_OTHER): Payer: 59

## 2020-04-15 ENCOUNTER — Other Ambulatory Visit: Payer: Self-pay

## 2020-04-15 DIAGNOSIS — E1165 Type 2 diabetes mellitus with hyperglycemia: Secondary | ICD-10-CM | POA: Diagnosis not present

## 2020-04-15 LAB — URINALYSIS, ROUTINE W REFLEX MICROSCOPIC
Bilirubin Urine: NEGATIVE
Hgb urine dipstick: NEGATIVE
Ketones, ur: NEGATIVE
Leukocytes,Ua: NEGATIVE
Nitrite: NEGATIVE
RBC / HPF: NONE SEEN (ref 0–?)
Specific Gravity, Urine: >=1.03 — AB (ref 1.000–1.030)
Total Protein, Urine: NEGATIVE
Urine Glucose: NEGATIVE
Urobilinogen, UA: 0.2 (ref 0.0–1.0)
pH: 5.5 (ref 5.0–8.0)

## 2020-04-15 LAB — COMPREHENSIVE METABOLIC PANEL
ALT: 30 U/L (ref 0–53)
AST: 25 U/L (ref 0–37)
Albumin: 4.7 g/dL (ref 3.5–5.2)
Alkaline Phosphatase: 66 U/L (ref 39–117)
BUN: 15 mg/dL (ref 6–23)
CO2: 30 mEq/L (ref 19–32)
Calcium: 10 mg/dL (ref 8.4–10.5)
Chloride: 105 mEq/L (ref 96–112)
Creatinine, Ser: 1.09 mg/dL (ref 0.40–1.50)
GFR: 72.86 mL/min (ref >60.00)
Glucose, Bld: 126 mg/dL — ABNORMAL HIGH (ref 70–99)
Potassium: 5.4 mEq/L — ABNORMAL HIGH (ref 3.5–5.1)
Sodium: 140 mEq/L (ref 135–145)
Total Bilirubin: 0.7 mg/dL (ref 0.2–1.2)
Total Protein: 7.8 g/dL (ref 6.0–8.3)

## 2020-04-15 LAB — LIPID PANEL
Cholesterol: 168 mg/dL (ref 0–200)
HDL: 49.8 mg/dL (ref >39.00)
LDL Cholesterol: 108 mg/dL — ABNORMAL HIGH (ref 0–99)
NonHDL: 118.38
Total CHOL/HDL Ratio: 3
Triglycerides: 54 mg/dL (ref 0.0–149.0)
VLDL: 10.8 mg/dL (ref 0.0–40.0)

## 2020-04-15 LAB — MICROALBUMIN / CREATININE URINE RATIO
Creatinine,U: 188.1 mg/dL
Microalb Creat Ratio: 0.4 mg/g (ref 0.0–30.0)
Microalb, Ur: 0.8 mg/dL (ref 0.0–1.9)

## 2020-04-15 LAB — HEMOGLOBIN A1C: Hgb A1c MFr Bld: 7.1 % — ABNORMAL HIGH (ref 4.6–6.5)

## 2020-04-18 ENCOUNTER — Other Ambulatory Visit: Payer: Self-pay

## 2020-04-18 ENCOUNTER — Encounter: Payer: Self-pay | Admitting: Endocrinology

## 2020-04-18 ENCOUNTER — Telehealth (INDEPENDENT_AMBULATORY_CARE_PROVIDER_SITE_OTHER): Payer: 59 | Admitting: Endocrinology

## 2020-04-18 DIAGNOSIS — E1165 Type 2 diabetes mellitus with hyperglycemia: Secondary | ICD-10-CM

## 2020-04-18 DIAGNOSIS — E875 Hyperkalemia: Secondary | ICD-10-CM | POA: Diagnosis not present

## 2020-04-18 DIAGNOSIS — E7800 Pure hypercholesterolemia, unspecified: Secondary | ICD-10-CM

## 2020-04-18 MED ORDER — ATORVASTATIN CALCIUM 10 MG PO TABS: 10.0000 mg | ORAL_TABLET | Freq: Every day | ORAL | 3 refills | Status: DC

## 2020-04-18 NOTE — Progress Notes (Addendum)
Patient ID: Joe Boyd, male   DOB: May 13, 1976, 44 y.o.   MRN: [id number]           Reason for Appointment: Follow-up visit for Type 2 Diabetes  I connected with the above-named patient by video enabled telemedicine application and verified that I am speaking with the correct person. The patient was explained the limitations of evaluation and management by telemedicine and the availability of in person appointments.  Patient also understood that there may be a patient responsible charge related to this service . Location of the patient: Patient's home . Location of the provider: Physician office Only the patient and myself were participating in the encounter The patient understood the above statements and agreed to proceed.  History of Present Illness:          Diagnosis: Type 2 diabetes mellitus, date of diagnosis: 2012        Past history:  At time of diagnosis he was having symptoms of increased thirst and urination and he was seen by his physician when he started feeling weak and dizzy also. His A1c at diagnosis was 12.8. He thinks he was treated with metformin only and also he started changing his diet along with eliminating sweet soft drinks.  Initially was treated with only 500 mg of metformin ER and in 2014 this was increased to 1000 mg.  He has not had any other medications for diabetes. He was last seen by his endocrinologist in [place] in 07/2014 when his A1c was 6.5 and no changes were made A1c of over 7%  in 7/16 and he was started on Kombiglyze XR In 6/17 with his A1c going up to 7.6 he was given Glyset 25 mg at lunch also in addition to [employer]  Recent history:   A1c is 7.1, and unchanged       Oral hypoglycemic drugs the patient is taking are: , Acarbose 50 mg at lunch mostly, Januvia 100 mg daily  Current management, blood sugar patterns and problems identified:  He has not been checking his blood sugars as directed and has only 3 readings in  March  Highest blood sugar was 176 and about 4 PM but his lab fasting glucose is 126  He states that he is mostly eating 1 meal at lunch, usually [place] food; he is generally more vegetarian  He has only recently started a little walking with spring weather otherwise has not been exercising  His weight is about the same as before  He thinks he is taking his acarbose regularly at lunchtime       Side effects from medications have been: None Compliance with the medical regimen: Fair   Glucose monitoring:  done occasionally,        Glucometer:  Contour      Blood Glucose readings: Recent readings between 113 and 129 with readings midmorning or afternoon  Self-care:  Meals:  1-2 meals per day.  Tries to get some protein with his lunch, sometimes yogurt, sometimes has chicken.    Usually avoiding snacks                  Dietician visit, most recent: 7/16                Weight history:   Wt Readings from Last 3 Encounters:  01/16/20 140 lb 6.4 oz (63.7 kg)  11/28/19 132 lb (59.9 kg)  11/13/19 132 lb (59.9 kg)    Glycemic control:   Lab Results  Component  Value Date   HGBA1C 7.1 (H) 04/15/2020   HGBA1C 7.1 (H) 01/10/2020   HGBA1C 6.6 (H) 08/21/2019   Lab Results  Component Value Date   MICROALBUR 0.8 04/15/2020   LDLCALC 108 (H) 04/15/2020   CREATININE 1.09 04/15/2020   Lab on 04/15/2020  Component Date Value Ref Range Status  . Microalb, Ur 04/15/2020 0.8  0.0 - 1.9 mg/dL Final  . Creatinine,U 04/15/2020 188.1  mg/dL Final  . Microalb Creat Ratio 04/15/2020 0.4  0.0 - 30.0 mg/g Final  . Color, Urine 04/15/2020 YELLOW  Yellow;Lt. Yellow;Straw;Dark Yellow;Amber;Green;Red;Brown Final  . APPearance 04/15/2020 CLEAR  Clear;Turbid;Slightly Cloudy;Cloudy Final  . Specific Gravity, Urine 04/15/2020 >=1.030* 1.000 - 1.030 Final  . pH 04/15/2020 5.5  5.0 - 8.0 Final  . Total Protein, Urine 04/15/2020 NEGATIVE  Negative Final  . Urine Glucose 04/15/2020 NEGATIVE  Negative  Final  . Ketones, ur 04/15/2020 NEGATIVE  Negative Final  . Bilirubin Urine 04/15/2020 NEGATIVE  Negative Final  . Hgb urine dipstick 04/15/2020 NEGATIVE  Negative Final  . Urobilinogen, UA 04/15/2020 0.2  0.0 - 1.0 Final  . Leukocytes,Ua 04/15/2020 NEGATIVE  Negative Final  . Nitrite 04/15/2020 NEGATIVE  Negative Final  . WBC, UA 04/15/2020 0-2/hpf  0-2/hpf Final  . RBC / HPF 04/15/2020 none seen  0-2/hpf Final  . Mucus, UA 04/15/2020 Presence of* None Final  . Squamous Epithelial / LPF 04/15/2020 Rare(0-4/hpf)  Rare(0-4/hpf) Final  . Cholesterol 04/15/2020 168  0 - 200 mg/dL Final   ATP III Classification       Desirable:  < 200 mg/dL               Borderline High:  200 - 239 mg/dL          High:  > = 240 mg/dL  . Triglycerides 04/15/2020 54.0  0.0 - 149.0 mg/dL Final   Normal:  <150 mg/dLBorderline High:  150 - 199 mg/dL  . HDL 04/15/2020 49.80  >39.00 mg/dL Final  . VLDL 04/15/2020 10.8  0.0 - 40.0 mg/dL Final  . LDL Cholesterol 04/15/2020 108* 0 - 99 mg/dL Final  . Total CHOL/HDL Ratio 04/15/2020 3   Final                  Men          Women1/2 Average Risk     3.4          3.3Average Risk          5.0          4.42X Average Risk          9.6          7.13X Average Risk          15.0          11.0                      . NonHDL 04/15/2020 118.38   Final   NOTE:  Non-HDL goal should be 30 mg/dL higher than patient's LDL goal (i.e. LDL goal of < 70 mg/dL, would have non-HDL goal of < 100 mg/dL)  . Sodium 04/15/2020 140  135 - 145 mEq/L Final  . Potassium 04/15/2020 5.4 No hemolysis seen* 3.5 - 5.1 mEq/L Final  . Chloride 04/15/2020 105  96 - 112 mEq/L Final  . CO2 04/15/2020 30  19 - 32 mEq/L Final  . Glucose, Bld 04/15/2020 126* 70 - 99 mg/dL Final  . BUN  04/15/2020 15  6 - 23 mg/dL Final  . Creatinine, Ser 04/15/2020 1.09  0.40 - 1.50 mg/dL Final  . Total Bilirubin 04/15/2020 0.7  0.2 - 1.2 mg/dL Final  . Alkaline Phosphatase 04/15/2020 66  39 - 117 U/L Final  . AST 04/15/2020 25   0 - 37 U/L Final  . ALT 04/15/2020 30  0 - 53 U/L Final  . Total Protein 04/15/2020 7.8  6.0 - 8.3 g/dL Final  . Albumin 09/81/191404/19/2021 4.7  3.5 - 5.2 g/dL Final  . GFR 78/29/562104/19/2021 72.86  >60.00 mL/min Final  . Calcium 04/15/2020 10.0  8.4 - 10.5 mg/dL Final  . Hgb H0QA1c MFr Bld 04/15/2020 7.1* 4.6 - 6.5 % Final   Glycemic Control Guidelines for People with Diabetes:Non Diabetic:  <6%Goal of Therapy: <7%Additional Action Suggested:  >8%       Allergies as of 04/18/2020   No Known Allergies     Medication List       Accurate as of April 18, 2020  3:59 PM. If you have any questions, ask your nurse or doctor.        acarbose 50 MG tablet Commonly known as: PRECOSE Take 1 tablet (50 mg total) by mouth daily. Take 1 tablet by mouth daily at lunch.   atorvastatin 10 MG tablet Commonly known as: LIPITOR Take 10 mg by mouth daily. TAKE 1 TABLET BY MOUTH ONCE EVERY 3 DAYS.   Bayer Microlet Lancets lancets 1 each by Other route every 3 (three) days. Use as instructed to check blood sugar every 3 days.   ferrous sulfate 325 (65 FE) MG tablet Take 1 tablet (325 mg total) by mouth 2 (two) times daily with a meal.   glucose blood test strip Commonly known as: IT consultantBayer Contour Test Test once every 3 days.   multivitamin with minerals Tabs tablet Take 1 tablet by mouth daily.   Probiotic-10 Chew Chew 2 capsules by mouth at bedtime.   sitaGLIPtin 100 MG tablet Commonly known as: JANUVIA Take 1 tablet (100 mg total) by mouth daily.       Allergies: No Known Allergies  Past Medical History:  Diagnosis Date  . Diabetes mellitus without complication (HCC)   . Tuberculosis    Finished Rx in 8/15    History reviewed. No pertinent surgical history.  Family History  Problem Relation Age of Onset  . Diabetes Mother   . Hypertension Mother   . Thyroid disease Mother   . Diabetes Father   . Hypertension Father   . Breast cancer Sister        4940s  . Cancer Neg Hx   . Heart disease  Neg Hx   . Colon cancer Neg Hx     Social History:  reports that he has never smoked. He has never used smokeless tobacco. He reports current alcohol use. He reports that he does not use drugs.    Review of Systems        Lipids:  LDL and triglycerides previously normal without any pharmacological treatment LDL is now 108, he does not think he has increased his intake of dairy products and is mostly vegetarian       Lab Results  Component Value Date   CHOL 168 04/15/2020   CHOL 110 01/05/2019   CHOL 152 02/24/2018   Lab Results  Component Value Date   HDL 49.80 04/15/2020   HDL 42.20 01/05/2019   HDL 41.30 02/24/2018   Lab Results  Component Value Date  LDLCALC 108 (H) 04/15/2020   LDLCALC 52 01/05/2019   LDLCALC 89 02/24/2018   Lab Results  Component Value Date   TRIG 54.0 04/15/2020   TRIG 77.0 01/05/2019   TRIG 108.0 02/24/2018   Lab Results  Component Value Date   CHOLHDL 3 04/15/2020   CHOLHDL 3 01/05/2019   CHOLHDL 4 02/24/2018   No results found for: LDLDIRECT                 Thyroid: Has History of a small goiter with normal TSH   Lab Results  Component Value Date   TSH 0.98 10/04/2019   HYPERKALEMIA:  His potassium is periodically high  potassium was normal after repeating the test on his last visit Renal function has been normal He has usually no orange juice to drink, bananas only couple of times a week is also potatoes  Lab Results  Component Value Date   K 5.4 No hemolysis seen (H) 04/15/2020     LABS:  Lab on 04/15/2020  Component Date Value Ref Range Status  . Microalb, Ur 04/15/2020 0.8  0.0 - 1.9 mg/dL Final  . Creatinine,U 88/32/5498 188.1  mg/dL Final  . Microalb Creat Ratio 04/15/2020 0.4  0.0 - 30.0 mg/g Final  . Color, Urine 04/15/2020 YELLOW  Yellow;Lt. Yellow;Straw;Dark Yellow;Amber;Green;Red;Brown Final  . APPearance 04/15/2020 CLEAR  Clear;Turbid;Slightly Cloudy;Cloudy Final  . Specific Gravity, Urine 04/15/2020  >=1.030* 1.000 - 1.030 Final  . pH 04/15/2020 5.5  5.0 - 8.0 Final  . Total Protein, Urine 04/15/2020 NEGATIVE  Negative Final  . Urine Glucose 04/15/2020 NEGATIVE  Negative Final  . Ketones, ur 04/15/2020 NEGATIVE  Negative Final  . Bilirubin Urine 04/15/2020 NEGATIVE  Negative Final  . Hgb urine dipstick 04/15/2020 NEGATIVE  Negative Final  . Urobilinogen, UA 04/15/2020 0.2  0.0 - 1.0 Final  . Leukocytes,Ua 04/15/2020 NEGATIVE  Negative Final  . Nitrite 04/15/2020 NEGATIVE  Negative Final  . WBC, UA 04/15/2020 0-2/hpf  0-2/hpf Final  . RBC / HPF 04/15/2020 none seen  0-2/hpf Final  . Mucus, UA 04/15/2020 Presence of* None Final  . Squamous Epithelial / LPF 04/15/2020 Rare(0-4/hpf)  Rare(0-4/hpf) Final  . Cholesterol 04/15/2020 168  0 - 200 mg/dL Final   ATP III Classification       Desirable:  < 200 mg/dL               Borderline High:  200 - 239 mg/dL          High:  > = 264 mg/dL  . Triglycerides 04/15/2020 54.0  0.0 - 149.0 mg/dL Final   Normal:  <158 mg/dLBorderline High:  150 - 199 mg/dL  . HDL 04/15/2020 49.80  >39.00 mg/dL Final  . VLDL 30/94/0768 10.8  0.0 - 40.0 mg/dL Final  . LDL Cholesterol 04/15/2020 108* 0 - 99 mg/dL Final  . Total CHOL/HDL Ratio 04/15/2020 3   Final                  Men          Women1/2 Average Risk     3.4          3.3Average Risk          5.0          4.42X Average Risk          9.6          7.13X Average Risk          15.0  11.0                      . NonHDL 04/15/2020 118.38   Final   NOTE:  Non-HDL goal should be 30 mg/dL higher than patient's LDL goal (i.e. LDL goal of < 70 mg/dL, would have non-HDL goal of < 100 mg/dL)  . Sodium 04/15/2020 140  135 - 145 mEq/L Final  . Potassium 04/15/2020 5.4 No hemolysis seen* 3.5 - 5.1 mEq/L Final  . Chloride 04/15/2020 105  96 - 112 mEq/L Final  . CO2 04/15/2020 30  19 - 32 mEq/L Final  . Glucose, Bld 04/15/2020 126* 70 - 99 mg/dL Final  . BUN 78/29/5621 15  6 - 23 mg/dL Final  . Creatinine, Ser  04/15/2020 1.09  0.40 - 1.50 mg/dL Final  . Total Bilirubin 04/15/2020 0.7  0.2 - 1.2 mg/dL Final  . Alkaline Phosphatase 04/15/2020 66  39 - 117 U/L Final  . AST 04/15/2020 25  0 - 37 U/L Final  . ALT 04/15/2020 30  0 - 53 U/L Final  . Total Protein 04/15/2020 7.8  6.0 - 8.3 g/dL Final  . Albumin 30/86/5784 4.7  3.5 - 5.2 g/dL Final  . GFR 69/62/9528 72.86  >60.00 mL/min Final  . Calcium 04/15/2020 10.0  8.4 - 10.5 mg/dL Final  . Hgb U1L MFr Bld 04/15/2020 7.1* 4.6 - 6.5 % Final   Glycemic Control Guidelines for People with Diabetes:Non Diabetic:  <6%Goal of Therapy: <7%Additional Action Suggested:  >8%     Physical Examination:  There were no vitals taken for this visit.    ASSESSMENT:  Diabetes type 2 , nonobese See history of present illness for detailed discussion of current diabetes management, blood sugar patterns and problems identified  He is on Januvia and Glyset currently once daily  A1c is 7.1, unchanged   Not clear what time of day he is having hyperglycemia to cause his A1c to be higher than usual He was likely doing better when he was taking Metformin which he cannot tolerate Most likely is getting high postprandial readings since his fasting glucose was only 126 in the lab As usual he is very inconsistent with checking his blood sugars and has not done any readings this month.  Hyperkalemia: This appears to be occurring randomly and usually not consistent on repeat testing Diet is not high in potassium  Hypercholesterolemia: This is mild but considering his active status and family history will start on atorvastatin 10 mg daily   PLAN:   Start taking 2 tablets of Precose at lunchtime at the start of the meal If he is eating breakfast or dinner also he will need to take 1 tablet with those meals Needs to start walking at least 30 minutes every other day Check blood sugars consistently at least every other day and more after lunch If blood sugars are still  high postprandially need to consider adding low-dose Prandin However if fasting blood sugars are consistently higher may consider switching him to Janumet XR  Avoid regular intake of bananas, citrus fruits, large amounts of potatoes  Atorvastatin 10 mg daily  Follow-up in 2 months    Danese Dorsainvil 04/18/2020, 3:59 PM   Note: This office note was prepared with Dragon voice recognition system technology. Any transcriptional errors that result from this process are unintentional.

## 2020-05-09 ENCOUNTER — Telehealth: Payer: Self-pay | Admitting: Endocrinology

## 2020-05-09 NOTE — Telephone Encounter (Signed)
Medication Refill Request  Did you call your pharmacy and request this refill first? Yes  . If patient has not contacted pharmacy first, instruct them to do so for future refills.  . Remind them that contacting the pharmacy for their refill is the quickest method to get the refill.  . Refill policy also stated that it will take anywhere between 24-72 hours to receive the refill.    Name of medication? Acarbose 50mg   Is this a 90 day supply? yes  Name and location of pharmacy?  [facility]) - [place], [place] - [address] [place] Phone:  [phone]  Fax:  [phone]

## 2020-05-10 ENCOUNTER — Other Ambulatory Visit: Payer: Self-pay

## 2020-05-10 MED ORDER — ACARBOSE 50 MG PO TABS: 50.0000 mg | ORAL_TABLET | Freq: Every day | ORAL | 1 refills | Status: DC

## 2020-05-10 NOTE — Telephone Encounter (Signed)
Rx sent 

## 2020-05-13 ENCOUNTER — Other Ambulatory Visit: Payer: Self-pay

## 2020-05-13 DIAGNOSIS — E1165 Type 2 diabetes mellitus with hyperglycemia: Secondary | ICD-10-CM

## 2020-05-13 MED ORDER — ACARBOSE 50 MG PO TABS: ORAL_TABLET | ORAL | 1 refills | Status: DC

## 2020-05-13 NOTE — Telephone Encounter (Signed)
Pharmacy is on the phone regarding acarbose RX - patient states the dr changed it to "1 tab 2x daily" but i dont see any notes on this in chart on my end - please resend RX if the directions did change.

## 2020-05-13 NOTE — Telephone Encounter (Signed)
RX sent to pharmacy with new corrected instructions.  LM for patient on VM with instructions of how to take the medication.

## 2020-05-30 ENCOUNTER — Other Ambulatory Visit: Payer: Self-pay

## 2020-05-31 ENCOUNTER — Encounter: Payer: Self-pay | Admitting: Family Medicine

## 2020-05-31 ENCOUNTER — Other Ambulatory Visit: Payer: Self-pay

## 2020-05-31 ENCOUNTER — Ambulatory Visit (INDEPENDENT_AMBULATORY_CARE_PROVIDER_SITE_OTHER): Payer: 59 | Admitting: Family Medicine

## 2020-05-31 VITALS — BP 112/72 | HR 67 | Temp 97.9°F | Resp 16 | Ht 67.0 in | Wt 142.4 lb

## 2020-05-31 DIAGNOSIS — M7711 Lateral epicondylitis, right elbow: Secondary | ICD-10-CM | POA: Diagnosis not present

## 2020-05-31 MED ORDER — MELOXICAM 15 MG PO TABS: 15.0000 mg | ORAL_TABLET | Freq: Every day | ORAL | 0 refills | Status: DC

## 2020-05-31 NOTE — Progress Notes (Signed)
   Subjective:    Patient ID: Joe Boyd, male    DOB: [birth year], 44 y.o.   MRN: [id number]  HPI Elbow pain- R sided.  Starts at elbow and radiate 'right down the line'.  Unable to sleep on R side.  Pain w/ unscrewing jaw.  Point tender over lateral epicondyle.  Pt does a lot of repetitive motion   Review of Systems For ROS see HPI   This visit occurred during the SARS-CoV-2 public health emergency.  Safety protocols were in place, including screening questions prior to the visit, additional usage of staff PPE, and extensive cleaning of exam room while observing appropriate contact time as indicated for disinfecting solutions.       Objective:   Physical Exam Vitals reviewed.  Constitutional:      General: He is not in acute distress.    Appearance: Normal appearance. He is not ill-appearing.  HENT:     Head: Normocephalic and atraumatic.  Cardiovascular:     Pulses: Normal pulses.  Musculoskeletal:        General: Tenderness (TTP over R lateral epicondyle) present. No swelling.  Neurological:     General: No focal deficit present.     Mental Status: He is alert and oriented to person, place, and time.  Psychiatric:        Mood and Affect: Mood normal.        Behavior: Behavior normal.        Thought Content: Thought content normal.           Assessment & Plan:  Lateral epicondylitis- new.  Reviewed dx and tx plan w/ pt.  Start w/ scheduled NSAID, ice, and counter force strap.  Discussed possibility of Prednisone or steroid injxn.  Pt to let us know in 2 weeks if no improvement.  Pt expressed understanding and is in agreement w/ plan.

## 2020-05-31 NOTE — Patient Instructions (Signed)
Follow up in 2 weeks if no better and we can refer you to Sports Med for treatment START the Meloxicam daily- take w/ food ICE! Get a counter force strap at any local pharmacy or online to wear while working (or any time!) Call with any questions or concerns Have a great weekend!

## 2020-07-08 ENCOUNTER — Other Ambulatory Visit: Payer: 59

## 2020-07-10 ENCOUNTER — Ambulatory Visit: Payer: 59 | Admitting: Endocrinology

## 2020-08-05 ENCOUNTER — Ambulatory Visit (INDEPENDENT_AMBULATORY_CARE_PROVIDER_SITE_OTHER): Payer: 59 | Admitting: Physician Assistant

## 2020-08-05 ENCOUNTER — Other Ambulatory Visit: Payer: Self-pay

## 2020-08-05 ENCOUNTER — Encounter: Payer: Self-pay | Admitting: Physician Assistant

## 2020-08-05 ENCOUNTER — Ambulatory Visit (INDEPENDENT_AMBULATORY_CARE_PROVIDER_SITE_OTHER)
Admission: RE | Admit: 2020-08-05 | Discharge: 2020-08-05 | Disposition: A | Discharge status: Home / Self Care | Payer: 59 | Source: Ambulatory Visit | Attending: Physician Assistant | Admitting: Physician Assistant

## 2020-08-05 VITALS — BP 122/80 | HR 69 | Temp 97.7°F | Resp 18 | Ht 67.0 in | Wt 147.2 lb

## 2020-08-05 DIAGNOSIS — M25512 Pain in left shoulder: Secondary | ICD-10-CM | POA: Diagnosis not present

## 2020-08-05 DIAGNOSIS — M7711 Lateral epicondylitis, right elbow: Secondary | ICD-10-CM | POA: Diagnosis not present

## 2020-08-05 MED ORDER — DICLOFENAC SODIUM 75 MG PO TBEC
75.0000 mg | DELAYED_RELEASE_TABLET | Freq: Two times a day (BID) | ORAL | 0 refills | Status: DC
Start: 2020-08-05 — End: 2020-09-23

## 2020-08-05 NOTE — Progress Notes (Signed)
Joe Boyd is a 44 y.o. male here for a new problem.  I acted as a Neurosurgeon for [employer], [name] [employer], [place]  History of Present Illness:   Chief Complaint  Patient presents with  . Bilateral Arm Pain    Was treated for tennis elbow of his right elbow recently. He stated that he has seen some improvement but he still has some pain.   . Left Shoulder Pain    x month ago he started having shoulder pain that radiates down his arm. He mentioned that the pain has gotten worse.    HPI   R-handed male who presents with:  R tennis elbow Was treated for tennis elbow of his right elbow recently by Dr. Beverely Boyd in June 2021. Was given oral meloxicam and told to use a brace, which he did purchase. He stated that he has seen some improvement but he still has some pain.   Left shoulder pain About one month ago, he started having left shoulder pain that radiates down his arm. Pain is getting worse with time. Denies prior injury to left shoulder. He does do periodic maintenance on machines at [facility] but denies any recent intense activity that could have caused these symptoms. Symptoms are worse at night. Difficult to reach overhead. Feels like he has reduced grip strength.  Past Medical History:  Diagnosis Date  . Diabetes mellitus without complication (HCC)   . Tuberculosis    Finished Rx in 8/15     Social History   Tobacco Use  . Smoking status: Never Smoker  . Smokeless tobacco: Never Used  Vaping Use  . Vaping Use: Never used  Substance Use Topics  . Alcohol use: Yes    Comment: occasionally  . Drug use: No    History reviewed. No pertinent surgical history.  Family History  Problem Relation Age of Onset  . Diabetes Mother   . Hypertension Mother   . Thyroid disease Mother   . Heart disease Mother   . Diabetes Father   . Hypertension Father   . Heart disease Father   . Breast cancer Sister        37s  . Heart disease Paternal Uncle   . Cancer Neg Hx    . Colon cancer Neg Hx     No Known Allergies  Current Medications:   Current Outpatient Medications:  .  acarbose (PRECOSE) 50 MG tablet, Take 2 tablet by mouth daily at lunch.  May also take 1 tab at breakfast and dinner if eating, Disp: 360 tablet, Rfl: 1 .  atorvastatin (LIPITOR) 10 MG tablet, Take 1 tablet (10 mg total) by mouth daily., Disp: 90 tablet, Rfl: 3 .  Bayer Microlet Lancets lancets, 1 each by Other route every 3 (three) days. Use as instructed to check blood sugar every 3 days., Disp: 100 each, Rfl: 2 .  glucose blood (BAYER CONTOUR TEST) test strip, Test once every 3 days., Disp: 100 each, Rfl: 5 .  meloxicam (MOBIC) 15 MG tablet, Take 1 tablet (15 mg total) by mouth daily., Disp: 30 tablet, Rfl: 0 .  sitaGLIPtin (JANUVIA) 100 MG tablet, Take 1 tablet (100 mg total) by mouth daily., Disp: 90 tablet, Rfl: 1 .  diclofenac (VOLTAREN) 75 MG EC tablet, Take 1 tablet (75 mg total) by mouth 2 (two) times daily., Disp: 30 tablet, Rfl: 0 .  ferrous sulfate 325 (65 FE) MG tablet, Take 1 tablet (325 mg total) by mouth 2 (two) times daily with  a meal. (Patient not taking: Reported on 08/05/2020), Disp: 60 tablet, Rfl: 2 .  Multiple Vitamin (MULTIVITAMIN WITH MINERALS) TABS tablet, Take 1 tablet by mouth daily. (Patient not taking: Reported on 08/05/2020), Disp: , Rfl:  .  Probiotic Product (PROBIOTIC-10) CHEW, Chew 2 capsules by mouth at bedtime. (Patient not taking: Reported on 08/05/2020), Disp: , Rfl:    Review of Systems:   ROS Negative unless otherwise specified per HPI.  Vitals:   Vitals:   08/05/20 0921  BP: 122/80  Pulse: 69  Resp: 18  Temp: 97.7 F (36.5 C)  TempSrc: Temporal  SpO2: 98%  Weight: 147 lb 3.2 oz (66.8 kg)  Height: 5\' 7"  (1.702 m)     Body mass index is 23.05 kg/m.  Physical Exam:   Physical Exam Vitals and nursing note reviewed.  Constitutional:      Appearance: He is well-developed.  HENT:     Head: Normocephalic.  Eyes:      Conjunctiva/sclera: Conjunctivae normal.     Pupils: Pupils are equal, round, and reactive to light.  Pulmonary:     Effort: Pulmonary effort is normal.  Musculoskeletal:        General: Normal range of motion.     Cervical back: Normal range of motion.     Comments: R elbow: Slight TTP to R lateral elbow  L shoulder: When bilateral arms abducted, has noticeable asymmetry in bilateral trapezius -- L appears much higher than R Significant pain with resisted abduction    Skin:    General: Skin is warm and dry.  Neurological:     Mental Status: He is alert and oriented to person, place, and time.     Comments: Grip strength 5/5 bilaterally  Psychiatric:        Behavior: Behavior normal.        Thought Content: Thought content normal.        Judgment: Judgment normal.       Assessment and Plan:   Joe Boyd was seen today for bilateral arm pain and left shoulder pain.  Diagnoses and all orders for this visit:  Acute pain of left shoulder Unclear etiology. He has visible asymmetry and significant pain with ROM. Will order xray and urgent referral to sports medicine. Start oral voltaren tablet. -     DG Shoulder Left; Future -     Ambulatory referral to Sports Medicine  Lateral epicondylitis of right elbow Start oral voltaren tablet. Referral to Sports Medicine. -     Ambulatory referral to Sports Medicine  Other orders -     diclofenac (VOLTAREN) 75 MG EC tablet; Take 1 tablet (75 mg total) by mouth 2 (two) times daily.   . Reviewed expectations re: course of current medical issues. . Discussed self-management of symptoms. . Outlined signs and symptoms indicating need for more acute intervention. . Patient verbalized understanding and all questions were answered. . See orders for this visit as documented in the electronic medical record. . Patient received an After-Visit Summary.  CMA or LPN served as scribe during this visit. History, Physical, and Plan performed  by medical provider. The above documentation has been reviewed and is accurate and complete.  Joe Harvest, [name]

## 2020-08-05 NOTE — Patient Instructions (Signed)
It was great to see you!  We are going to get an xray  An order for an xray has been put in for you. To get your xray, you can walk in at the [facility] location without a scheduled appointment. The address is [address] It is across the street from [facility]. X-ray is located in the basement.  Hours of operation are M-F 8:30am to 5:00pm. Please note that they are closed for lunch between 12:30 and 1:00pm.  Start oral diclofenac for pain -- this will replace mobic and ibuprofen. It is not a controlled substance.  A referral has been placed for you to see Dr. Clementeen Graham or Dr. Terrilee Files with [facility] Sports [facility]. Someone from there office will be in touch soon regarding your appointment with him. His location: Psychiatrist Medicine at [facility] [address] on the 1st floor.   Phone number [phone], Fax [phone].  This location is across the street from the entrance to [employer] and in the same complex as the [facility] and [employer] bank    Take care,  Jarold Motto [name]

## 2020-08-06 ENCOUNTER — Ambulatory Visit (INDEPENDENT_AMBULATORY_CARE_PROVIDER_SITE_OTHER): Payer: 59 | Admitting: Family Medicine

## 2020-08-06 ENCOUNTER — Encounter: Payer: Self-pay | Admitting: Family Medicine

## 2020-08-06 ENCOUNTER — Ambulatory Visit: Payer: Self-pay

## 2020-08-06 VITALS — BP 110/78 | HR 66 | Ht 67.0 in | Wt 146.4 lb

## 2020-08-06 DIAGNOSIS — M7711 Lateral epicondylitis, right elbow: Secondary | ICD-10-CM | POA: Diagnosis not present

## 2020-08-06 DIAGNOSIS — M25512 Pain in left shoulder: Secondary | ICD-10-CM

## 2020-08-06 NOTE — Progress Notes (Signed)
Subjective:    I'm seeing this patient as a consultation for:  Rinaldo Cloud, [place]. Note will be routed back to referring provider/PCP.  CC: L shoulder/arm pain  I, Molly Weber, LAT, ATC, am serving as scribe for Dr. Clementeen Graham.  HPI: Pt is a 44 y/o male presenting w/ c/o L shoulder/arm pain x approximately one month w/ no known MOI.    Radiating pain: yes into his L arm Neck pain: No L shoulder mechanical symptoms: yes L UE numbness/tingling: No L UE weakness: yes in L forearm/wrist/hand Aggravating factors: L shoulder overhead AROM; symptoms are worse at night when laying on L side; horizontal aDd; L shoulder functional IR Treatments tried: Meloxicam; topical pain-relieving cream; Tylenol  Diagnostic imaging: L shoulder XR- 08/05/20  R elbow: Pain x 2-3 months w/ no known MOI. -Swelling: no -Radiating: no -Aggravating factors: R elbow flexion/extension; repetitive activity -Treatments tried: anti-inflammatories; brace/elbow counterforce strap  Past medical history, Surgical history, Family history, Social history, Allergies, and medications have been entered into the medical record, reviewed.   Review of Systems: No new headache, visual changes, nausea, vomiting, diarrhea, constipation, dizziness, abdominal pain, skin rash, fevers, chills, night sweats, weight loss, swollen lymph nodes, body aches, joint swelling, muscle aches, chest pain, shortness of breath, mood changes, visual or auditory hallucinations.   Objective:    Vitals:   08/06/20 0812  BP: 110/78  Pulse: 66  SpO2: 97%   General: Well Developed, well nourished, and in no acute distress.  Neuro/Psych: Alert and oriented x3, extra-ocular muscles intact, able to move all 4 extremities, sensation grossly intact. Skin: Warm and dry, no rashes noted.  Respiratory: Not using accessory muscles, speaking in full sentences, trachea midline.  Cardiovascular: Pulses palpable, no extremity edema. Abdomen: Does not appear  distended. MSK:  C-spine: Normal-appearing nontender normal motion.  Negative Spurling's test bilaterally.  Upper extremity strength is intact except noted below.  Left shoulder normal-appearing nontender. Range of motion abduction 120 degrees.  External rotation normal internal rotation lumbar spine. Strength intact abduction external/internal rotation however pain with abduction. Positive Hawkins and Neer's test.  Positive empty can test. Negative Yergason's and speeds test.  Contralateral right shoulder normal-appearing nontender normal motion normal strength negative impingement testing.  Right elbow normal-appearing normal motion normal strength.  Tender palpation lateral epicondyle.  Pain with resisted wrist and finger extension.  Pulses cap refill and sensation intact bilateral upper extremities.  Lab and Radiology Results No results found for this or any previous visit (from the past 72 hour(s)). DG Shoulder Left  Result Date: 08/05/2020 CLINICAL DATA:  Left shoulder pain and limited range of motion for 1 month. EXAM: LEFT SHOULDER - 2+ VIEW COMPARISON:  None. FINDINGS: There is no evidence of fracture or dislocation. There is no evidence of arthropathy or other focal bone abnormality. Soft tissues are unremarkable. IMPRESSION: Normal exam. Electronically Signed   By: Drusilla Kanner M.D.   On: 08/05/2020 15:18  I, Clementeen Graham, personally (independently) visualized and performed the interpretation of the images attached in this note.  Diagnostic Limited MSK Ultrasound of: Left shoulder Biceps tendon intact normal-appearing in bicipital groove. Subscapularis tendon intact normal-appearing Supraspinatus tendon is intact with minimal increased subacromial bursa thickness. Infraspinatus tendon is intact and normal appearing AC joint narrowed degenerative mildly. 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  Diagnostic Limited MSK Ultrasound of: Right elbow laterally Lateral epicondyle and  common extensor tendon insertion margin normal-appearing without avulsion fragment or significant tendon tear Impression: Epicondylitis without tear  Impression and Recommendations:    Assessment and Plan: 44 y.o. male with left arm and shoulder pain predominantly due to rotator cuff tendinopathy.  No large tear seen on ultrasound.  Patient does have some pain extending beyond his elbow but this is not in a distribution consistent with nerve root.  It is possible it is just an abnormal referred pain.  Regardless we will treat with physical therapy oral and topical NSAIDs.  Recheck back in a month.  Right elbow: Lateral epicondylitis.  Again physical therapy and topical NSAIDs.  Recheck back in a month.  We will consider injection if not improved.  Would like to avoid steroid injection due to diabetes.   Orders Placed This Encounter  Procedures  . [place] LIMITED JOINT SPACE STRUCTURES UP LEFT(NO LINKED CHARGES)    Order Specific Question:   Reason for Exam (SYMPTOM  OR DIAGNOSIS REQUIRED)    Answer:   L shoulder pain    Order Specific Question:   Preferred imaging location?    Answer:   Adult nurse [facility]  . Ambulatory referral to Physical Therapy    Referral Priority:   Routine    Referral Type:   Physical Medicine    Referral Reason:   Specialty Services Required    Requested Specialty:   Physical Therapy    Number of Visits Requested:   1   No orders of the defined types were placed in this encounter.   Discussed warning signs or symptoms. Please see discharge instructions. Patient expresses understanding.   The above documentation has been reviewed and is accurate and complete Clementeen Graham, M.D.

## 2020-08-06 NOTE — Patient Instructions (Signed)
Thank you for coming in today. Plan for PT.  You should hear from [facility] Pt Use voltaren gel on the elbow and shoulder as needed.  Recheck with me in about 1 month.  Ok to also use ibuprofen or aleve.   Let me know if you are having a problem. We can do more.    Shoulder Impingement Syndrome Rehab Ask your health care provider which exercises are safe for you. Do exercises exactly as told by your health care provider and adjust them as directed. It is normal to feel mild stretching, pulling, tightness, or discomfort as you do these exercises. Stop right away if you feel sudden pain or your pain gets worse. Do not begin these exercises until told by your health care provider. Stretching and range-of-motion exercise This exercise warms up your muscles and joints and improves the movement and flexibility of your shoulder. This exercise also helps to relieve pain and stiffness. Passive horizontal adduction In passive adduction, you use your other hand to move the injured arm toward your body. The injured arm does not move on its own. In this movement, your arm is moved across your body in the horizontal plane (horizontal adduction). 1. Sit or stand and pull your left / right elbow across your chest, toward your other shoulder. Stop when you feel a gentle stretch in the back of your shoulder and upper arm. ? Keep your arm at shoulder height. ? Keep your arm as close to your body as you comfortably can. 2. Hold for __________ seconds. 3. Slowly return to the starting position. Repeat __________ times. Complete this exercise __________ times a day. Strengthening exercises These exercises build strength and endurance in your shoulder. Endurance is the ability to use your muscles for a long time, even after they get tired. External rotation, isometric This is an exercise in which you press the back of your wrist against a door frame without moving your shoulder joint (isometric). 1. Stand or sit  in a doorway, facing the door frame. 2. Bend your left / right elbow and place the back of your wrist against the door frame. Only the back of your wrist should be touching the frame. Keep your upper arm at your side. 3. Gently press your wrist against the door frame, as if you are trying to push your arm away from your abdomen (external rotation). Press as hard as you are able without pain. ? Avoid shrugging your shoulder while you press your wrist against the door frame. Keep your shoulder blade tucked down toward the middle of your back. 4. Hold for __________ seconds. 5. Slowly release the tension, and relax your muscles completely before you repeat the exercise. Repeat __________ times. Complete this exercise __________ times a day. Internal rotation, isometric This is an exercise in which you press your palm against a door frame without moving your shoulder joint (isometric). 1. Stand or sit in a doorway, facing the door frame. 2. Bend your left / right elbow and place the palm of your hand against the door frame. Only your palm should be touching the frame. Keep your upper arm at your side. 3. Gently press your hand against the door frame, as if you are trying to push your arm toward your abdomen (internal rotation). Press as hard as you are able without pain. ? Avoid shrugging your shoulder while you press your hand against the door frame. Keep your shoulder blade tucked down toward the middle of your back. 4. Hold  for __________ seconds. 5. Slowly release the tension, and relax your muscles completely before you repeat the exercise. Repeat __________ times. Complete this exercise __________ times a day. Scapular protraction, supine  1. Lie on your back on a firm surface (supine position). Hold a __________ weight in your left / right hand. 2. Raise your left / right arm straight into the air so your hand is directly above your shoulder joint. 3. Push the weight into the air so your  shoulder (scapula) lifts off the surface that you are lying on. The scapula will push up or forward (protraction). Do not move your head, neck, or back. 4. Hold for __________ seconds. 5. Slowly return to the starting position. Let your muscles relax completely before you repeat this exercise. Repeat __________ times. Complete this exercise __________ times a day. Scapular retraction  1. Sit in a stable chair without armrests, or stand up. 2. Secure an exercise band to a stable object in front of you so the band is at shoulder height. 3. Hold one end of the exercise band in each hand. Your palms should face down. 4. Squeeze your shoulder blades together (retraction) and move your elbows slightly behind you. Do not shrug your shoulders upward while you do this. 5. Hold for __________ seconds. 6. Slowly return to the starting position. Repeat __________ times. Complete this exercise __________ times a day. Shoulder extension  1. Sit in a stable chair without armrests, or stand up. 2. Secure an exercise band to a stable object in front of you so the band is above shoulder height. 3. Hold one end of the exercise band in each hand. 4. Straighten your elbows and lift your hands up to shoulder height. 5. Squeeze your shoulder blades together and pull your hands down to the sides of your thighs (extension). Stop when your hands are straight down by your sides. Do not let your hands go behind your body. 6. Hold for __________ seconds. 7. Slowly return to the starting position. Repeat __________ times. Complete this exercise __________ times a day. This information is not intended to replace advice given to you by your health care provider. Make sure you discuss any questions you have with your health care provider. Document Revised: 04/07/2019 Document Reviewed: 01/09/2019 Elsevier Patient Education  2020 [employer].    Tennis Elbow Tennis elbow is swelling (inflammation) in your outer  forearm, near your elbow. Swelling affects the tissues that connect muscle to bone (tendons). Tennis elbow can happen in any sport or job in which you use your elbow too much. It is caused by doing the same motion over and over. Tennis elbow can cause:  Pain and tenderness in your forearm and the outer part of your elbow. You may have pain all the time, or only when using the arm.  A burning feeling. This runs from your elbow through your arm.  Weak grip in your hand. Follow these instructions at home: Activity  Rest your elbow and wrist. Avoid activities that cause problems, as told by your doctor.  If told by your doctor, wear an elbow strap to reduce stress on the area.  Do physical therapy exercises as told.  If you lift an object, lift it with your palm facing up. This is easier on your elbow. Lifestyle  If your tennis elbow is caused by sports, check your equipment and make sure that: ? You are using it correctly. ? It fits you well.  If your tennis elbow is caused  by work or by using a computer, take breaks often to stretch your arm. Talk with your manager about how you can manage your condition at work. If you have a brace:  Wear the brace as told by your doctor. Remove it only as told by your doctor.  Loosen the brace if your fingers tingle, get numb, or turn cold and blue.  Keep the brace clean.  If the brace is not waterproof, ask your doctor if you may take the brace off for bathing. If you must keep the brace on while bathing: ? Do not let it get wet. ? Cover it with a watertight covering when you take a bath or a shower. General instructions   If told, put ice on the painful area: ? Put ice in a plastic bag. ? Place a towel between your skin and the bag. ? Leave the ice on for 20 minutes, 2-3 times a day.  Take over-the-counter and prescription medicines only as told by your doctor.  Keep all follow-up visits as told by your doctor. This is  important. Contact a doctor if:  Your pain does not get better with treatment.  Your pain gets worse.  You have weakness in your forearm, hand, or fingers.  You cannot feel your forearm, hand, or fingers. Summary  Tennis elbow is swelling (inflammation) in your outer forearm, near your elbow.  Tennis elbow is caused by doing the same motion over and over.  Rest your elbow and wrist. Avoid activities that cause problems, as told by your doctor.  If told, put ice on the painful area for 20 minutes, 2-3 times a day. This information is not intended to replace advice given to you by your health care provider. Make sure you discuss any questions you have with your health care provider. Document Revised: 09/09/2018 Document Reviewed: 09/28/2017 Elsevier Patient Education  2020 [employer].

## 2020-08-08 ENCOUNTER — Telehealth: Payer: Self-pay | Admitting: Family Medicine

## 2020-08-08 NOTE — Telephone Encounter (Signed)
Sent to [facility]

## 2020-08-08 NOTE — Addendum Note (Signed)
Addended by: Evon Slack on: 08/08/2020 02:42 PM   Modules accepted: Orders

## 2020-08-08 NOTE — Telephone Encounter (Signed)
[facility] does not take [employer]. Pt checked, can we send order to [facility] at [place].

## 2020-08-12 NOTE — Telephone Encounter (Signed)
Left VM for pt advising that PT referral changed to [facility] per his request & to contact them directly for scheduling.

## 2020-08-19 ENCOUNTER — Other Ambulatory Visit: Payer: Self-pay | Admitting: Endocrinology

## 2020-08-19 ENCOUNTER — Other Ambulatory Visit: Payer: Self-pay

## 2020-08-19 ENCOUNTER — Other Ambulatory Visit (INDEPENDENT_AMBULATORY_CARE_PROVIDER_SITE_OTHER): Payer: 59

## 2020-08-19 DIAGNOSIS — E7800 Pure hypercholesterolemia, unspecified: Secondary | ICD-10-CM

## 2020-08-19 DIAGNOSIS — E1165 Type 2 diabetes mellitus with hyperglycemia: Secondary | ICD-10-CM

## 2020-08-19 LAB — LIPID PANEL
Cholesterol: 101 mg/dL (ref 0–200)
HDL: 37.3 mg/dL — ABNORMAL LOW (ref >39.00)
LDL Cholesterol: 44 mg/dL (ref 0–99)
NonHDL: 63.86
Total CHOL/HDL Ratio: 3
Triglycerides: 100 mg/dL (ref 0.0–149.0)
VLDL: 20 mg/dL (ref 0.0–40.0)

## 2020-08-19 LAB — COMPREHENSIVE METABOLIC PANEL
ALT: 106 U/L — ABNORMAL HIGH (ref 0–53)
AST: 44 U/L — ABNORMAL HIGH (ref 0–37)
Albumin: 4.5 g/dL (ref 3.5–5.2)
Alkaline Phosphatase: 72 U/L (ref 39–117)
BUN: 16 mg/dL (ref 6–23)
CO2: 31 mEq/L (ref 19–32)
Calcium: 10.3 mg/dL (ref 8.4–10.5)
Chloride: 106 mEq/L (ref 96–112)
Creatinine, Ser: 1.12 mg/dL (ref 0.40–1.50)
GFR: 70.5 mL/min (ref >60.00)
Glucose, Bld: 133 mg/dL — ABNORMAL HIGH (ref 70–99)
Potassium: 4.9 mEq/L (ref 3.5–5.1)
Sodium: 142 mEq/L (ref 135–145)
Total Bilirubin: 0.9 mg/dL (ref 0.2–1.2)
Total Protein: 7.5 g/dL (ref 6.0–8.3)

## 2020-08-19 LAB — HEMOGLOBIN A1C: Hgb A1c MFr Bld: 9.5 % — ABNORMAL HIGH (ref 4.6–6.5)

## 2020-08-20 LAB — FRUCTOSAMINE: Fructosamine: 380 umol/L — ABNORMAL HIGH (ref 0–285)

## 2020-08-21 ENCOUNTER — Telehealth: Payer: Self-pay | Admitting: *Deleted

## 2020-08-21 ENCOUNTER — Ambulatory Visit (INDEPENDENT_AMBULATORY_CARE_PROVIDER_SITE_OTHER): Payer: 59 | Admitting: Endocrinology

## 2020-08-21 ENCOUNTER — Encounter: Payer: Self-pay | Admitting: Endocrinology

## 2020-08-21 ENCOUNTER — Other Ambulatory Visit: Payer: Self-pay

## 2020-08-21 VITALS — BP 112/70 | HR 74 | Ht 67.0 in | Wt 147.0 lb

## 2020-08-21 DIAGNOSIS — E1165 Type 2 diabetes mellitus with hyperglycemia: Secondary | ICD-10-CM

## 2020-08-21 DIAGNOSIS — E119 Type 2 diabetes mellitus without complications: Secondary | ICD-10-CM

## 2020-08-21 DIAGNOSIS — E7800 Pure hypercholesterolemia, unspecified: Secondary | ICD-10-CM

## 2020-08-21 DIAGNOSIS — R945 Abnormal results of liver function studies: Secondary | ICD-10-CM

## 2020-08-21 MED ORDER — MICROLET LANCETS MISC: 3 refills | Status: DC

## 2020-08-21 MED ORDER — GLIMEPIRIDE 1 MG PO TABS: 1.0000 mg | ORAL_TABLET | Freq: Every day | ORAL | 3 refills | Status: DC

## 2020-08-21 MED ORDER — GLUCOSE BLOOD VI STRP: ORAL_STRIP | 5 refills | Status: DC

## 2020-08-21 NOTE — Progress Notes (Signed)
Patient ID: Joe Boyd, male   DOB: Sep 22, 1976, 44 y.o.   MRN: [id number]           Reason for Appointment: Follow-up visit for Type 2 Diabetes   History of Present Illness:          Diagnosis: Type 2 diabetes mellitus, date of diagnosis: 2012        Past history:  At time of diagnosis he was having symptoms of increased thirst and urination and he was seen by his physician when he started feeling weak and dizzy also. His A1c at diagnosis was 12.8. He thinks he was treated with metformin only and also he started changing his diet along with eliminating sweet soft drinks.  Initially was treated with only 500 mg of metformin ER and in 2014 this was increased to 1000 mg.  He has not had any other medications for diabetes. He was [place] 07[place]2014 when his A1c was 6.5 and no changes were made A1c of over 7%  in 7/16 and he was started on Kombiglyze XR In 6/17 with his A1c going up to 7.6 he was given Glyset 25 mg at lunch also in addition to Campbell SoupKombiglyze  Recent history:   A1c is the highest he has had at 9.5 compared to 7.1       Oral hypoglycemic drugs the patient is taking are:  Acarbose 50 mg at breakfast and dinner, 100 mg at lunch , Januvia 100 mg daily  Current management, blood sugar patterns and problems identified:  He says that he has been totally noncompliant with diet, glucose monitoring until about a month ago because of family issues  Was also eating larger portions and not watching what he was eating causing weight gain  Also may not have been taking any medications especially acarbose for some time  About a month ago he was starting to have a little blurred vision and when he checked his blood sugar it was 381  Since then he has cut back on portions, high-fat foods and has taken all his medications  He has also started taking acarbose with every meal and taking 2 tablets instead of 1 at lunchtime which is his main meal  He  has started walking on the treadmill at least 6 days a week for exercise  With this his blood sugars are improving and as low as 90 recently  However fasting blood sugars still appear to be relatively high and not below 140      Side effects from medications have been: None   Glucose monitoring:  done occasionally,        Glucometer:  Contour      Blood Glucose readings:    PRE-MEAL Fasting Lunch Dinner Bedtime Overall  Glucose range:  142-255  155     Mean/median:  189     201   POST-MEAL PC Breakfast PC Lunch PC Dinner  Glucose range:    90-381  Mean/median:    214   Previous readings between 113 and 129 with readings midmorning or afternoon  Self-care:  Meals:  1-2 meals per day.  Tries to get some protein with his lunch, sometimes yogurt, sometimes has chicken.    Usually avoiding snacks                  Dietician visit, most recent: 7/16                Weight history:  Wt Readings from Last 3 Encounters:  08/21/20 147 lb (66.7 kg)  08/06/20 146 lb 6.4 oz (66.4 kg)  08/05/20 147 lb 3.2 oz (66.8 kg)    Glycemic control:   Lab Results  Component Value Date   HGBA1C 9.5 (H) 08/19/2020   HGBA1C 7.1 (H) 04/15/2020   HGBA1C 7.1 (H) 01/10/2020   Lab Results  Component Value Date   MICROALBUR 0.8 04/15/2020   LDLCALC 44 08/19/2020   CREATININE 1.12 08/19/2020   Lab on 08/19/2020  Component Date Value Ref Range Status  . Fructosamine 08/19/2020 380* 0 - 285 umol/L Final   Comment: Published reference interval for apparently healthy subjects between age 24 and 68 is 87 - 285 umol/L and in a poorly controlled diabetic population is 228 - 563 umol/L with a mean of 396 umol/L.   Marland Kitchen Cholesterol 08/19/2020 101  0 - 200 mg/dL Final   ATP III Classification       Desirable:  < 200 mg/dL               Borderline High:  200 - 239 mg/dL          High:  > = 428 mg/dL  . Triglycerides 08/19/2020 100.0  0 - 149 mg/dL Final   Normal:  <768 mg/dLBorderline High:  150 -  199 mg/dL  . HDL 08/19/2020 37.30* >39.00 mg/dL Final  . VLDL 11/57/2620 20.0  0.0 - 40.0 mg/dL Final  . LDL Cholesterol 08/19/2020 44  0 - 99 mg/dL Final  . Total CHOL/HDL Ratio 08/19/2020 3   Final                  Men          Women1/2 Average Risk     3.4          3.3Average Risk          5.0          4.42X Average Risk          9.6          7.13X Average Risk          15.0          11.0                      . NonHDL 08/19/2020 63.86   Final   NOTE:  Non-HDL goal should be 30 mg/dL higher than patient's LDL goal (i.e. LDL goal of < 70 mg/dL, would have non-HDL goal of < 100 mg/dL)  . Sodium 08/19/2020 142  135 - 145 mEq/L Final  . Potassium 08/19/2020 4.9  3.5 - 5.1 mEq/L Final  . Chloride 08/19/2020 106  96 - 112 mEq/L Final  . CO2 08/19/2020 31  19 - 32 mEq/L Final  . Glucose, Bld 08/19/2020 133* 70 - 99 mg/dL Final  . BUN 35/59/7416 16  6 - 23 mg/dL Final  . Creatinine, Ser 08/19/2020 1.12  0.40 - 1.50 mg/dL Final  . Total Bilirubin 08/19/2020 0.9  0.2 - 1.2 mg/dL Final  . Alkaline Phosphatase 08/19/2020 72  39 - 117 U/L Final  . AST 08/19/2020 44* 0 - 37 U/L Final  . ALT 08/19/2020 106* 0 - 53 U/L Final  . Total Protein 08/19/2020 7.5  6.0 - 8.3 g/dL Final  . Albumin 38/45/3646 4.5  3.5 - 5.2 g/dL Final  . GFR 80/32/1224 70.50  >60.00 mL/min Final  . Calcium 08/19/2020 10.3  8.4 -  10.5 mg/dL Final  . Hgb F0X MFr Bld 08/19/2020 9.5* 4.6 - 6.5 % Final   Glycemic Control Guidelines for People with Diabetes:Non Diabetic:  <6%Goal of Therapy: <7%Additional Action Suggested:  >8%       Allergies as of 08/21/2020   No Known Allergies     Medication List       Accurate as of August 21, 2020  4:27 PM. If you have any questions, ask your nurse or doctor.        acarbose 50 MG tablet Commonly known as: PRECOSE Take 2 tablet by mouth daily at lunch.  May also take 1 tab at breakfast and dinner if eating   atorvastatin 10 MG tablet Commonly known as: LIPITOR Take 1 tablet (10  mg total) by mouth daily.   Bayer Microlet Lancets lancets 1 each by Other route every 3 (three) days. Use as instructed to check blood sugar every 3 days.   diclofenac 75 MG EC tablet Commonly known as: VOLTAREN Take 1 tablet (75 mg total) by mouth 2 (two) times daily.   ferrous sulfate 325 (65 FE) MG tablet Take 1 tablet (325 mg total) by mouth 2 (two) times daily with a meal.   glucose blood test strip Commonly known as: IT consultant Test once every 3 days.   meloxicam 15 MG tablet Commonly known as: MOBIC Take 1 tablet (15 mg total) by mouth daily.   multivitamin with minerals Tabs tablet Take 1 tablet by mouth daily.   Probiotic-10 Chew Chew 2 capsules by mouth at bedtime.   sitaGLIPtin 100 MG tablet Commonly known as: JANUVIA Take 1 tablet (100 mg total) by mouth daily.       Allergies: No Known Allergies  Past Medical History:  Diagnosis Date  . Diabetes mellitus without complication (HCC)   . Tuberculosis    Finished Rx in 8/15    No past surgical history on file.  Family History  Problem Relation Age of Onset  . Diabetes Mother   . Hypertension Mother   . Thyroid disease Mother   . Heart disease Mother   . Diabetes Father   . Hypertension Father   . Heart disease Father   . Breast cancer Sister        70s  . Heart disease Paternal Uncle   . Cancer Neg Hx   . Colon cancer Neg Hx     Social History:  reports that he has never smoked. He has never used smokeless tobacco. He reports current alcohol use. He reports that he does not use drugs.    Review of Systems        Lipids:  LDL and triglycerides have been variable Started on atorvastatin in 4/21 because of high LDL and high risk factors LDL is improved Also with improved diet recently has lipids are improved although has low HDL        Lab Results  Component Value Date   CHOL 101 08/19/2020   CHOL 168 04/15/2020   CHOL 110 01/05/2019   Lab Results  Component Value Date    HDL 37.30 (L) 08/19/2020   HDL 49.80 04/15/2020   HDL 42.20 01/05/2019   Lab Results  Component Value Date   LDLCALC 44 08/19/2020   LDLCALC 108 (H) 04/15/2020   LDLCALC 52 01/05/2019   Lab Results  Component Value Date   TRIG 100.0 08/19/2020   TRIG 54.0 04/15/2020   TRIG 77.0 01/05/2019   Lab Results  Component Value Date  CHOLHDL 3 08/19/2020   CHOLHDL 3 04/15/2020   CHOLHDL 3 01/05/2019   No results found for: LDLDIRECT                 Thyroid: Has History of a small goiter with normal TSH   Lab Results  Component Value Date   TSH 0.98 10/04/2019   HYPERKALEMIA:  His potassium is previously high at times Renal function has been normal He has usually no orange juice to drink, bananas only couple of times a week is also potatoes  Lab Results  Component Value Date   K 4.9 08/19/2020   Increased liver function: He recently was taking Voltaren for his shoulder pain  Lab Results  Component Value Date   ALT 106 (H) 08/19/2020     LABS:  Lab on 08/19/2020  Component Date Value Ref Range Status  . Fructosamine 08/19/2020 380* 0 - 285 umol/L Final   Comment: Published reference interval for apparently healthy subjects between age 73 and 39 is 88 - 285 umol/L and in a poorly controlled diabetic population is 228 - 563 umol/L with a mean of 396 umol/L.   Marland Kitchen Cholesterol 08/19/2020 101  0 - 200 mg/dL Final   ATP III Classification       Desirable:  < 200 mg/dL               Borderline High:  200 - 239 mg/dL          High:  > = 063 mg/dL  . Triglycerides 08/19/2020 100.0  0 - 149 mg/dL Final   Normal:  <016 mg/dLBorderline High:  150 - 199 mg/dL  . HDL 08/19/2020 37.30* >39.00 mg/dL Final  . VLDL 01/05/3234 20.0  0.0 - 40.0 mg/dL Final  . LDL Cholesterol 08/19/2020 44  0 - 99 mg/dL Final  . Total CHOL/HDL Ratio 08/19/2020 3   Final                  Men          Women1/2 Average Risk     3.4          3.3Average Risk          5.0          4.42X Average Risk           9.6          7.13X Average Risk          15.0          11.0                      . NonHDL 08/19/2020 63.86   Final   NOTE:  Non-HDL goal should be 30 mg/dL higher than patient's LDL goal (i.e. LDL goal of < 70 mg/dL, would have non-HDL goal of < 100 mg/dL)  . Sodium 08/19/2020 142  135 - 145 mEq/L Final  . Potassium 08/19/2020 4.9  3.5 - 5.1 mEq/L Final  . Chloride 08/19/2020 106  96 - 112 mEq/L Final  . CO2 08/19/2020 31  19 - 32 mEq/L Final  . Glucose, Bld 08/19/2020 133* 70 - 99 mg/dL Final  . BUN 57/32/2025 16  6 - 23 mg/dL Final  . Creatinine, Ser 08/19/2020 1.12  0.40 - 1.50 mg/dL Final  . Total Bilirubin 08/19/2020 0.9  0.2 - 1.2 mg/dL Final  . Alkaline Phosphatase 08/19/2020 72  39 - 117 U/L Final  . AST 08/19/2020 44*  0 - 37 U/L Final  . ALT 08/19/2020 106* 0 - 53 U/L Final  . Total Protein 08/19/2020 7.5  6.0 - 8.3 g/dL Final  . Albumin 45/40/9811 4.5  3.5 - 5.2 g/dL Final  . GFR 91/47/8295 70.50  >60.00 mL/min Final  . Calcium 08/19/2020 10.3  8.4 - 10.5 mg/dL Final  . Hgb A2Z MFr Bld 08/19/2020 9.5* 4.6 - 6.5 % Final   Glycemic Control Guidelines for People with Diabetes:Non Diabetic:  <6%Goal of Therapy: <7%Additional Action Suggested:  >8%     Physical Examination:  BP 112/70 (BP Location: Left Arm, Patient Position: Sitting, Cuff Size: Normal)   Pulse 74   Ht  (1.702 m)   Wt 147 lb (66.7 kg)   SpO2 97%   BMI 23.02 kg/m     ASSESSMENT:  Diabetes type 2 , nonobese See history of present illness for detailed discussion of current diabetes management, blood sugar patterns and problems identified  His A1c is 9.5   As discussed above his blood sugars are totally out of control and higher than usual with poor diet, lack of exercise, not taking medications and stress However with considerably improving his diet, exercise regimen and medication regimen his blood sugars have been much better and only mildly increased fasting now  Hyperkalemia:  Resolved  High liver function: Not clear if this is related to recent hypoglycemia or taking Voltaren tablets, remotely possible it is related to atorvastatin also  Hypercholesterolemia: This is improved with Lipitor 10 mg and he will continue    PLAN:   No change in diabetes medication that he is currently taking However to help his fasting readings he will start Amaryl half tablet of the 1 mg dose at bedtime for 3 days and then if needed 1 mg  Continue atorvastatin 10 mg daily  Stop Voltaren and have liver functions checked again in about 2 weeks  Follow-up in 2 months    Shaneka Efaw 08/21/2020, 4:27 PM   Note: This office note was prepared with Dragon voice recognition system technology. Any transcriptional errors that result from this process are unintentional.

## 2020-08-21 NOTE — Patient Instructions (Addendum)
Check blood sugars on waking up 3 days a week  Also check blood sugars about 2 hours after meals and do this after different meals by rotation  Recommended blood sugar levels on waking up are 90-130 and about 2 hours after meal is 130-160  Please bring your blood sugar monitor to each visit, thank you  Glimeperide 1/2 at bedtime for 3 days then 1 pill if am still > 130   Stop Diclofenac

## 2020-08-23 ENCOUNTER — Other Ambulatory Visit: Payer: Self-pay | Admitting: *Deleted

## 2020-08-23 MED ORDER — CONTOUR NEXT TEST VI STRP: 1.0000 | ORAL_STRIP | 0 refills | Status: DC | PRN

## 2020-08-23 NOTE — Telephone Encounter (Signed)
Corrected refill sent

## 2020-08-23 NOTE — Telephone Encounter (Signed)
Patient called stating he needs the contour NEXT test strips because the regular contour strips don't work with his device.

## 2020-08-27 ENCOUNTER — Telehealth: Payer: Self-pay | Admitting: Endocrinology

## 2020-08-27 ENCOUNTER — Other Ambulatory Visit: Payer: Self-pay | Admitting: *Deleted

## 2020-08-27 MED ORDER — ONETOUCH VERIO VI STRP: ORAL_STRIP | 0 refills | Status: DC

## 2020-08-27 MED ORDER — ONETOUCH DELICA LANCETS 30G MISC: 0 refills | Status: DC

## 2020-08-27 MED ORDER — ONETOUCH VERIO SYNC SYSTEM W/DEVICE KIT: PACK | 0 refills | Status: DC

## 2020-08-27 NOTE — Telephone Encounter (Signed)
Patient called re: Patient's insurance does not cover Contour device and supplies. Patient requests new RX's-ALL for 90 day supply- for OneTouch meter and supplies (OneTouch Test Strips and Lancets) be sent asap (patient is totally out) to:  [facility]) - [place], [place] - [address] [place] Phone:  [phone]  Fax:  [phone]

## 2020-08-29 ENCOUNTER — Other Ambulatory Visit: Payer: Self-pay

## 2020-08-29 MED ORDER — ONETOUCH DELICA LANCETS 30G MISC: 1 refills | Status: DC

## 2020-08-29 MED ORDER — ONETOUCH VERIO VI STRP: ORAL_STRIP | 1 refills | Status: DC

## 2020-08-29 MED ORDER — ONETOUCH VERIO SYNC SYSTEM W/DEVICE KIT: PACK | 1 refills | Status: DC

## 2020-09-09 ENCOUNTER — Ambulatory Visit: Payer: 59 | Admitting: Family Medicine

## 2020-09-23 ENCOUNTER — Encounter: Payer: Self-pay | Admitting: Family Medicine

## 2020-09-23 ENCOUNTER — Ambulatory Visit (INDEPENDENT_AMBULATORY_CARE_PROVIDER_SITE_OTHER): Payer: 59 | Admitting: Family Medicine

## 2020-09-23 ENCOUNTER — Other Ambulatory Visit: Payer: Self-pay

## 2020-09-23 VITALS — BP 116/72 | HR 78 | Ht 67.0 in | Wt 151.0 lb

## 2020-09-23 DIAGNOSIS — M7711 Lateral epicondylitis, right elbow: Secondary | ICD-10-CM

## 2020-09-23 DIAGNOSIS — G8929 Other chronic pain: Secondary | ICD-10-CM

## 2020-09-23 DIAGNOSIS — M25512 Pain in left shoulder: Secondary | ICD-10-CM | POA: Diagnosis not present

## 2020-09-23 DIAGNOSIS — R945 Abnormal results of liver function studies: Secondary | ICD-10-CM

## 2020-09-23 LAB — HEPATIC FUNCTION PANEL
AG Ratio: 1.7 (calc) (ref 1.0–2.5)
ALT: 64 U/L — ABNORMAL HIGH (ref 9–46)
AST: 37 U/L (ref 10–40)
Albumin: 4.7 g/dL (ref 3.6–5.1)
Alkaline phosphatase (APISO): 85 U/L (ref 36–130)
Bilirubin, Direct: 0.2 mg/dL (ref 0.0–0.2)
Globulin: 2.8 g/dL (calc) (ref 1.9–3.7)
Indirect Bilirubin: 0.6 mg/dL (calc) (ref 0.2–1.2)
Total Bilirubin: 0.8 mg/dL (ref 0.2–1.2)
Total Protein: 7.5 g/dL (ref 6.1–8.1)

## 2020-09-23 MED ORDER — NITROGLYCERIN 0.2 MG/HR TD PT24: MEDICATED_PATCH | TRANSDERMAL | 1 refills | Status: DC

## 2020-09-23 NOTE — Progress Notes (Signed)
   I, Joe Boyd, LAT, ATC, am serving as scribe for Dr. Clementeen Graham.  Joe Boyd is a 44 y.o. male who presents to [facility] at [facility] today for f/u of L shoulder and R elbow pain.  He was last seen by Dr. [name] on 08/06/20 and was referred to PT at [facility] and was advised to use Voltaren gel.  Since his last visit, pt reports that his pain is not improving. Pain did improve prior to physical therapy but feels that PT has inflamed extensor tendon. Left shoulder pain is worse at night.  Diagnostic testing: L shoulder XR- 08/05/20  Pertinent review of systems: No fevers or chills  Relevant historical information: Diabetes   Exam:  BP 116/72   Pulse 78   Ht 5\' 7"  (1.702 m)   Wt 151 lb (68.5 kg)   SpO2 96%   BMI 23.65 kg/m  General: Well Developed, well nourished, and in no acute distress.   MSK: Left shoulder normal-appearing Nontender. Range of motion abduction 100 degrees external rotation 20 degrees beyond neutral internal rotation lumbar spine. Strength intact within limits of motion.  Positive Hawkins and Neer's test.  Right elbow normal-appearing normal motion.  Tender palpation lateral epicondyle.  Pain with resisted wrist and finger extension.    Lab and Radiology Results X-ray images left shoulder obtained August 05, 2020 personally and independently interpreted.  No significant abnormalities.  No degenerative changes are fractures.  Radiology read agrees with my findings.    Assessment and Plan: 44 y.o. male with left shoulder pain worsening.  Now becoming a chronic issue.  His worsening lack of motion is concerning for adhesive capsulitis.  Especially given his diabetes.  Discussed options.  As he is failing physical therapy will proceed with MRI to further characterize cause of pain and for surgical or injection planning.  Recheck after MRI.  Right elbow pain lateral epicondylitis.  Discussed options as well.  Plan to emphasize home  exercise program and add nitroglycerin patch protocol.  Additionally his endocrinologist ordered labs (hepatic function panel for elevated liver enzymes ) that are due to be drawn today.  Discussed this with the patient who will get labs ordered by Dr. 49 today at the end of the visit.  PDMP not reviewed this encounter. Orders Placed This Encounter  Procedures  . MR SHOULDER LEFT WO CONTRAST    Standing Status:   Future    Standing Expiration Date:   09/23/2021    Order Specific Question:   What is the patient's sedation requirement?    Answer:   No Sedation    Order Specific Question:   Does the patient have a pacemaker or implanted devices?    Answer:   No    Order Specific Question:   Preferred imaging location?    Answer:   09/25/2021 (table limit-350lbs)    Order Specific Question:   Radiology Contrast Protocol - do NOT remove file path    Answer:   [email or web address]   Meds ordered this encounter  Medications  . nitroGLYCERIN (NITRODUR - DOSED IN MG/24 HR) 0.2 mg/hr patch    Sig: Apply 1/4 patch daily to tendon for tendonitis.    Dispense:  30 patch    Refill:  1     Discussed warning signs or symptoms. Please see discharge instructions. Patient expresses understanding.   The above documentation has been reviewed and is accurate and complete Licensed conveyancer, M.D.

## 2020-09-23 NOTE — Addendum Note (Signed)
Addended by: Merrilyn Puma on: 09/23/2020 10:40 AM   Modules accepted: Orders

## 2020-09-23 NOTE — Patient Instructions (Addendum)
Lab work today  Thank you for coming in today.  Please get labs Ordered by Dr Lucianne Muss today before you leave  Get MRI.  You should hear soon about MRI.  Recheck following MRI.   Do the exercises for the elbow.   Nitroglycerin Protocol   Apply 1/4 nitroglycerin patch to affected area daily.  Change position of patch within the affected area every 24 hours.  You may experience a headache during the first 1-2 weeks of using the patch, these should subside.  If you experience headaches after beginning nitroglycerin patch treatment, you may take your preferred over the counter pain reliever.  Another side effect of the nitroglycerin patch is skin irritation or rash related to patch adhesive.  Please notify our office if you develop more severe headaches or rash, and stop the patch.  Tendon healing with nitroglycerin patch may require 12 to 24 weeks depending on the extent of injury.  Men should not use if taking Viagra, Cialis, or Levitra.   Do not use if you have migraines or rosacea.    [facility] Elbow Rehab Ask your health care provider which exercises are safe for you. Do exercises exactly as told by your health care provider and adjust them as directed. It is normal to feel mild stretching, pulling, tightness, or discomfort as you do these exercises. Stop right away if you feel sudden pain or your pain gets worse. Do not begin these exercises until told by your health care provider. Stretching and range-of-motion exercises These exercises warm up your muscles and joints and improve the movement and flexibility of your elbow. These exercises also help to relieve pain, numbness, and tingling. Wrist flexion, assisted  1. Straighten your left / right elbow in front of you with your palm facing down toward the floor. ? If told by your health care provider, bend your left / right elbow to a 90-degree angle (right angle) at your side. 2. With your other hand, gently push over the back  of your left / right hand so your fingers point toward the floor (flexion). Stop when you feel a gentle stretch on the back of your forearm. 3. Hold this position for __________ seconds. Repeat __________ times. Complete this exercise __________ times a day. Wrist extension, assisted  1. Straighten your left / right elbow in front of you with your palm facing up toward the ceiling. ? If told by your health care provider, bend your left / right elbow to a 90-degree angle (right angle) at your side. 2. With your other hand, gently pull your left / right hand and fingers toward the floor (extension). Stop when you feel a gentle stretch on the palm side of your forearm. 3. Hold this position for __________ seconds. Repeat __________ times. Complete this exercise __________ times a day. Assisted forearm rotation, supination 1. Sit or stand with your left / right elbow bent to a 90-degree angle (right angle) at your side. 2. Using your uninjured hand, turn (rotate) your left / right palm up toward the ceiling (supination) until you feel a gentle stretch along the inside of your forearm. 3. Hold this position for __________ seconds. Repeat __________ times. Complete this exercise __________ times a day. Assisted forearm rotation, pronation 1. Sit or stand with your left / right elbow bent to a 90-degree angle (right angle) at your side. 2. Using your uninjured hand, rotate your left / right palm down toward the floor (pronation) until you feel a gentle stretch along the  outside of your forearm. 3. Hold this position for __________ seconds. Repeat __________ times. Complete this exercise __________ times a day. Strengthening exercises These exercises build strength and endurance in your forearm and elbow. Endurance is the ability to use your muscles for a long time, even after they get tired. Radial deviation  1. Stand with a __________ weight or a hammer in your left / right hand. Or, sit while  holding a rubber exercise band or tubing, with your left / right forearm supported on a table or countertop. ? If you are standing, position your forearm so that your thumb is facing forward. If you are sitting, position your forearm so that the thumb is facing the ceiling. This is the neutral position. 2. Raise your hand upward in front of you so your thumb moves toward the ceiling (radial deviation), or pull up on the rubber tubing. Keep your forearm and elbow still while you move your wrist only. 3. Hold this position for __________ seconds. 4. Slowly return to the starting position. Repeat __________ times. Complete this exercise __________ times a day. Wrist extension, eccentric 1. Sit with your left / right forearm palm-down and supported on a table or other surface. Let your left / right wrist extend over the edge of the surface. 2. Hold a __________ weight or a piece of exercise band or tubing in your left / right hand. ? If using a rubber exercise band or tubing, hold the other end of the tubing with your other hand. 3. Use your uninjured hand to move your left / right hand up toward the ceiling. 4. Take your uninjured hand away and slowly return to the starting position using only your left / right hand. Lowering your arm under tension is called eccentric extension. Repeat __________ times. Complete this exercise __________ times a day. Wrist extension Do not do this exercise if it causes pain at the outside of your elbow. Only do this exercise once instructed by your health care provider. 1. Sit with your left / right forearm supported on a table or other surface and your palm turned down toward the floor. Let your left / right wrist extend over the edge of the surface. 2. Hold a __________ weight or a piece of rubber exercise band or tubing. ? If you are using a rubber exercise band or tubing, hold the band or tubing in place with your other hand to provide resistance. 3. Slowly bend  your wrist so your hand moves up toward the ceiling (extension). Move only your wrist, keeping your forearm and elbow still. 4. Hold this position for __________ seconds. 5. Slowly return to the starting position. Repeat __________ times. Complete this exercise __________ times a day. Forearm rotation, supination To do this exercise, you will need a lightweight hammer or rubber mallet. 1. Sit with your left / right forearm supported on a table or other surface. Bend your elbow to a 90-degree angle (right angle). Position your forearm so that your palm is facing down toward the floor, with your hand resting over the edge of the table. 2. Hold a hammer in your left / right hand. ? To make this exercise easier, hold the hammer near the head of the hammer. ? To make this exercise harder, hold the hammer near the end of the handle. 3. Without moving your wrist or elbow, slowly rotate your forearm so your palm faces up toward the ceiling (supination). 4. Hold this position for __________ seconds. 5. Slowly return  to the starting position. Repeat __________ times. Complete this exercise __________ times a day. Shoulder blade squeeze 1. Sit in a stable chair or stand with good posture. If you are sitting down, do not let your back touch the back of the chair. 2. Your arms should be at your sides with your elbows bent to a 90-degree angle (right angle). Position your forearms so that your thumbs are facing the ceiling (neutral position). 3. Without lifting your shoulders up, squeeze your shoulder blades tightly together. 4. Hold this position for __________ seconds. 5. Slowly release and return to the starting position. Repeat __________ times. Complete this exercise __________ times a day. This information is not intended to replace advice given to you by your health care provider. Make sure you discuss any questions you have with your health care provider. Document Revised: 04/06/2019 Document  Reviewed: 02/07/2019 Elsevier Patient Education  2020 [employer].

## 2020-09-24 NOTE — Progress Notes (Signed)
Please call to let patient know that the liver tests are improving, to follow-up with PCP

## 2020-09-29 ENCOUNTER — Other Ambulatory Visit: Payer: Self-pay

## 2020-09-29 ENCOUNTER — Ambulatory Visit (INDEPENDENT_AMBULATORY_CARE_PROVIDER_SITE_OTHER): Payer: 59

## 2020-09-29 DIAGNOSIS — M25512 Pain in left shoulder: Secondary | ICD-10-CM

## 2020-09-30 NOTE — Progress Notes (Signed)
Fortunately MRI does not show frozen shoulder changes. It does show some tendonitis and medium arthritis of the small joint at the top of the shoulder [facility]). Please schedule follow up with me to review the results and discuss plan further.

## 2020-10-01 ENCOUNTER — Encounter: Payer: Self-pay | Admitting: Family Medicine

## 2020-10-01 ENCOUNTER — Ambulatory Visit: Payer: Self-pay

## 2020-10-01 ENCOUNTER — Ambulatory Visit (INDEPENDENT_AMBULATORY_CARE_PROVIDER_SITE_OTHER): Payer: 59 | Admitting: Family Medicine

## 2020-10-01 ENCOUNTER — Other Ambulatory Visit: Payer: Self-pay

## 2020-10-01 VITALS — BP 120/82 | HR 71 | Ht 67.0 in | Wt 154.0 lb

## 2020-10-01 DIAGNOSIS — M25512 Pain in left shoulder: Secondary | ICD-10-CM | POA: Diagnosis not present

## 2020-10-01 DIAGNOSIS — G8929 Other chronic pain: Secondary | ICD-10-CM | POA: Diagnosis not present

## 2020-10-01 DIAGNOSIS — M7711 Lateral epicondylitis, right elbow: Secondary | ICD-10-CM | POA: Diagnosis not present

## 2020-10-01 NOTE — Progress Notes (Signed)
I, Christoper Fabian, LAT, ATC, am serving as scribe for Dr. Clementeen Graham.  Joe Boyd is a 44 y.o. male who presents to [facility] at [facility] today for f/u of L shoulder pain and L shoulder MRI review.  He was last seen by Dr. [name] on 09/23/20 w/ worsening R elbow and L shoulder pain and was prescribed nitroglycerin patches.  He has been attending outpatient PT at [facility].  Since his last visit, pt reports no change in his L shoulder.  He notes some slight improvement in his R elbow w/ the nitroglycerin patches.  Diagnostic testing: L shoulder MRI- 09/29/20; L shoulder XR- 08/05/20  Pertinent review of systems: No fevers or chills  Relevant historical information: Diabetes.  Historically well controlled.  Has not been very well controlled a month ago when his mother was sick in the hospital.  He notes his blood sugars in a much better controlled with fasting sugars typically 130 or less.   Exam:  BP 120/82 (BP Location: Right Arm, Patient Position: Sitting, Cuff Size: Normal)   Pulse 71   Ht 5\' 7"  (1.702 m)   Wt 154 lb (69.9 kg)   SpO2 96%   BMI 24.12 kg/m  General: Well Developed, well nourished, and in no acute distress.   MSK: Left shoulder normal-appearing decreased motion abduction.  Intact strength.    Lab and Radiology Results No results found for this or any previous visit (from the past 72 hour(s)). MR SHOULDER LEFT WO CONTRAST  Result Date: 09/29/2020 CLINICAL DATA:  Left shoulder pain for 3 months.  No known injury. EXAM: MRI OF THE LEFT SHOULDER WITHOUT CONTRAST TECHNIQUE: Multiplanar, multisequence MR imaging of the shoulder was performed. No intravenous contrast was administered. COMPARISON:  None. FINDINGS: Rotator cuff: Intact. Mild supraspinatus and infraspinatus tendinopathy noted. Muscles:  Normal.  No atrophy or focal lesion. Biceps long head:  Intact. 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 Small volume of fluid  is seen in the subacromial/subdeltoid bursa. Glenohumeral Joint: Appears normal. Labrum: The superior labrum appears markedly degenerated and frayed. Bones:  No fracture or worrisome lesion. Other: None. IMPRESSION: The superior labrum is markedly degenerated and frayed. Mild appearing supraspinatus infraspinatus tendinopathy out tear. Moderate acromioclavicular osteoarthritis. Small volume of subacromial/subdeltoid fluid compatible with bursitis. Electronically Signed   By: 11/29/2020 M.D.   On: 09/29/2020 12:52   I, 11/29/2020, personally (independently) visualized and performed the interpretation of the images attached in this note.  Procedure: Real-time Ultrasound Guided Injection of left shoulder subacromial bursa Device: Philips Affiniti 50G Images permanently stored and available for review in PACS Verbal informed consent obtained.  Discussed risks and benefits of procedure. Warned about infection bleeding damage to structures skin hypopigmentation and fat atrophy among others. Patient expresses understanding and agreement Time-out conducted.   Noted no overlying erythema, induration, or other signs of local infection.   Skin prepped in a sterile fashion.   Local anesthesia: Topical Ethyl chloride.   With sterile technique and under real time ultrasound guidance:  40 mg of Kenalog and 2 mg milliliters of Marcaine of injected into subacromial bursa. Fluid seen entering the bursa.   Completed without difficulty   Pain immediately resolved suggesting accurate placement of the medication.   Advised to call if fevers/chills, erythema, induration, drainage, or persistent bleeding.   Images permanently stored and available for review in the ultrasound unit.  Impression: Technically successful ultrasound guided injection.  Assessment and Plan: 44 y.o. male with left shoulder pain due to subacromial bursitis and AC DJD.  Patient also has labral tear that may be contribution to  pain.  He had good response to subacromial injection in clinic today.  Plan for injection and home exercise program.  If not improving next step is orthopedic surgery consultation.  Right elbow lateral epicondylitis.  Too early to tell if the nitroglycerin patch and exercise program are helpful.  Continue current management  Diabetes we will work strongly on blood sugar control   PDMP not reviewed this encounter. Orders Placed This Encounter  Procedures  . [place] LIMITED JOINT SPACE STRUCTURES UP LEFT(NO LINKED CHARGES)    Order Specific Question:   Reason for Exam (SYMPTOM  OR DIAGNOSIS REQUIRED)    Answer:   L shoulder pain    Order Specific Question:   Preferred imaging location?    Answer:   [facility]   No orders of the defined types were placed in this encounter.    Discussed warning signs or symptoms. Please see discharge instructions. Patient expresses understanding.   The above documentation has been reviewed and is accurate and complete Clementeen Graham, M.D.  Total encounter time 20 minutes including face-to-face time with the patient and, reviewing past medical record, and charting on the date of service.  Discussed MRI results and findings and treatment plan.  Time base billing excludes time to do ultrasound injection

## 2020-10-01 NOTE — Patient Instructions (Signed)
Thank you for coming in today.  Call or go to the ER if you develop a large red swollen joint with extreme pain or oozing puss.   If this fails to improve next step is surgery consultation. Let me know.   Continue home exercises.   Recheck as needed.

## 2020-10-07 ENCOUNTER — Encounter (INDEPENDENT_AMBULATORY_CARE_PROVIDER_SITE_OTHER): Payer: 59 | Admitting: Ophthalmology

## 2020-10-14 ENCOUNTER — Other Ambulatory Visit: Payer: Self-pay

## 2020-10-14 ENCOUNTER — Other Ambulatory Visit: Payer: Self-pay | Admitting: Endocrinology

## 2020-10-14 ENCOUNTER — Other Ambulatory Visit (INDEPENDENT_AMBULATORY_CARE_PROVIDER_SITE_OTHER): Payer: 59

## 2020-10-14 DIAGNOSIS — E1165 Type 2 diabetes mellitus with hyperglycemia: Secondary | ICD-10-CM | POA: Diagnosis not present

## 2020-10-14 LAB — BASIC METABOLIC PANEL
BUN: 21 mg/dL (ref 6–23)
CO2: 32 mEq/L (ref 19–32)
Calcium: 10.8 mg/dL — ABNORMAL HIGH (ref 8.4–10.5)
Chloride: 102 mEq/L (ref 96–112)
Creatinine, Ser: 1.16 mg/dL (ref 0.40–1.50)
GFR: 74.88 mL/min (ref >60.00)
Glucose, Bld: 132 mg/dL — ABNORMAL HIGH (ref 70–99)
Potassium: 4.4 mEq/L (ref 3.5–5.1)
Sodium: 141 mEq/L (ref 135–145)

## 2020-10-15 ENCOUNTER — Encounter (INDEPENDENT_AMBULATORY_CARE_PROVIDER_SITE_OTHER): Payer: Self-pay | Admitting: Ophthalmology

## 2020-10-15 ENCOUNTER — Ambulatory Visit (INDEPENDENT_AMBULATORY_CARE_PROVIDER_SITE_OTHER): Payer: 59 | Admitting: Ophthalmology

## 2020-10-15 ENCOUNTER — Other Ambulatory Visit: Payer: Self-pay

## 2020-10-15 DIAGNOSIS — H40003 Preglaucoma, unspecified, bilateral: Secondary | ICD-10-CM

## 2020-10-15 DIAGNOSIS — H43822 Vitreomacular adhesion, left eye: Secondary | ICD-10-CM

## 2020-10-15 DIAGNOSIS — H2513 Age-related nuclear cataract, bilateral: Secondary | ICD-10-CM

## 2020-10-15 DIAGNOSIS — H43821 Vitreomacular adhesion, right eye: Secondary | ICD-10-CM | POA: Diagnosis not present

## 2020-10-15 DIAGNOSIS — E1165 Type 2 diabetes mellitus with hyperglycemia: Secondary | ICD-10-CM | POA: Diagnosis not present

## 2020-10-15 LAB — FRUCTOSAMINE: Fructosamine: 324 umol/L — ABNORMAL HIGH (ref 0–285)

## 2020-10-15 LAB — HM DIABETES EYE EXAM

## 2020-10-15 NOTE — Assessment & Plan Note (Signed)

## 2020-10-15 NOTE — Assessment & Plan Note (Signed)
I will refer patient to general ophthalmology, agrees with ophthalmology.  Needs good general eye examination but with particular attention to glaucoma with a family history and with a subtle change in the optic nerve appearance over the last 2 years The cup-to-disc  change from 0.3-0.35 the last 2 years.

## 2020-10-15 NOTE — Progress Notes (Signed)
10/15/2020     CHIEF COMPLAINT Patient presents for Diabetic Eye Exam   HISTORY OF PRESENT ILLNESS: Joe Boyd is a 44 y.o. male who presents to the clinic today for:   HPI    Diabetic Eye Exam    Vision is stable.  Associated Symptoms Negative for Flashes and Floaters.  Diabetes characteristics include Type 2.  Blood sugar level is controlled.  Last Blood Glucose 120.  Last A1C 9.5.  I, the attending physician,  performed the HPI with the patient and updated documentation appropriately.          Comments    2 Year Diabetic Exam OU. OCT  Pt states he recently got a new gls rx. Pt is now using bifocals. Pt denies FOL and floaters.        Last edited by Tilda Franco on 10/15/2020  3:44 PM. (History)      Referring physician: Inda Coke, [address],  [place] [id number]  HISTORICAL INFORMATION:   Selected notes from the MEDICAL RECORD NUMBER    Lab Results  Component Value Date   HGBA1C 9.5 (H) 08/19/2020     CURRENT MEDICATIONS: No current outpatient medications on file. (Ophthalmic Drugs)   No current facility-administered medications for this visit. (Ophthalmic Drugs)   Current Outpatient Medications (Other)  Medication Sig  . acarbose (PRECOSE) 50 MG tablet Take 2 tablet by mouth daily at lunch.  May also take 1 tab at breakfast and dinner if eating  . atorvastatin (LIPITOR) 10 MG tablet Take 1 tablet (10 mg total) by mouth daily.  . Blood Glucose Monitoring Suppl ([place]) w/Device KIT Use to check blood sugars daily-Dx code E11.65  . ferrous sulfate 325 (65 FE) MG tablet Take 1 tablet (325 mg total) by mouth 2 (two) times daily with a meal.  . glimepiride (AMARYL) 1 MG tablet Take 1 tablet (1 mg total) by mouth at bedtime.  Marland Kitchen glucose blood (ONETOUCH VERIO) test strip Use to check blood sugars daily-Dx code E11.65  . Multiple Vitamin (MULTIVITAMIN WITH MINERALS) TABS tablet Take 1 tablet by mouth daily.   .  nitroGLYCERIN (NITRODUR - DOSED IN MG/24 HR) 0.2 mg/hr patch Apply 1/4 patch daily to tendon for tendonitis.  Glory Rosebush Delica Lancets 10U MISC Use to check blood sugars daily-Dx code E11.65  . Probiotic Product (PROBIOTIC-10) CHEW Chew 2 capsules by mouth at bedtime.   . sitaGLIPtin (JANUVIA) 100 MG tablet Take 1 tablet (100 mg total) by mouth daily.   No current facility-administered medications for this visit. (Other)      REVIEW OF SYSTEMS: ROS    Positive for: Endocrine   Last edited by Tilda Franco on 10/15/2020  3:44 PM. (History)       ALLERGIES No Known Allergies  PAST MEDICAL HISTORY Past Medical History:  Diagnosis Date  . Diabetes mellitus without complication ([place])   . Tuberculosis    Finished Rx in 8/15   History reviewed. No pertinent surgical history.  FAMILY HISTORY Family History  Problem Relation Age of Onset  . Diabetes Mother   . Hypertension Mother   . Thyroid disease Mother   . Heart disease Mother   . Diabetes Father   . Hypertension Father   . Heart disease Father   . Breast cancer Sister        82s  . Heart disease Paternal Uncle   . Cancer Neg Hx   . Colon cancer Neg Hx  SOCIAL HISTORY Social History   Tobacco Use  . Smoking status: Never Smoker  . Smokeless tobacco: Never Used  Vaping Use  . Vaping Use: Never used  Substance Use Topics  . Alcohol use: Yes    Comment: occasionally  . Drug use: No         OPHTHALMIC EXAM: Base Eye Exam    Visual Acuity (Snellen - Linear)      Right Left   Dist cc 20/20 -2 20/20 -2   Correction: Glasses       Tonometry (Tonopen, 3:49 PM)      Right Left   Pressure 21 39       Tonometry #2 (Tonopen, 3:49 PM)      Right Left   Pressure 38 49       Tonometry #3 (Applanation, 4:00 PM)      Right Left   Pressure 25 20       Pupils      Pupils Dark Light Shape React APD   Right PERRL 4 3 Round Brisk None   Left PERRL 4 3 Round Brisk None       Visual Fields  (Counting fingers)      Left Right    Full Full       Neuro/Psych    Oriented x3: Yes   Mood/Affect: Normal        Slit Lamp and Fundus Exam    External Exam      Right Left   External Normal Normal       Slit Lamp Exam      Right Left   Lids/Lashes Normal Normal   Conjunctiva/Sclera White and quiet White and quiet   Cornea Clear Clear   Anterior Chamber Deep and quiet,  van Herick method one half CT depth peripheral Deep and quiet,  van Herick method one half CT depth peripheral   Iris Round and reactive Round and reactive   Lens 1+ Nuclear sclerosis 1+ Nuclear sclerosis   Anterior Vitreous Normal Normal       Fundus Exam      Right Left   Posterior Vitreous Normal Normal   Disc Normal Normal   C/D Ratio 0.35 0.35   Macula Normal, 90D exam Normal, 90D exam   Vessels no DR no DR   Periphery Normal Normal          IMAGING AND PROCEDURES  Imaging and Procedures for 10/15/20  OCT, Retina - OU - Both Eyes       Right Eye Quality was good. Scan locations included subfoveal. Central Foveal Thickness: 266. Findings include normal foveal contour, vitreomacular adhesion .   Left Eye Quality was good. Scan locations included subfoveal. Central Foveal Thickness: 276. Findings include vitreomacular adhesion , normal foveal contour.   Notes No active maculopathy OU.                ASSESSMENT/PLAN:  Type 2 diabetes mellitus with hyperglycemia ([place]) The patient has diabetes without any evidence of retinopathy. The patient advised to maintain good blood glucose control, excellent blood pressure control, and favorable levels of cholesterol, low density lipoprotein, and high density lipoproteins. Follow up in 1 year was recommended. Explained that fluctuations in visual acuity , or "out of focus", may result from large variations of blood sugar control.  Nuclear sclerotic cataract of both eyes Minor, not visually significant, watch the anterior chamber peripheral  depth however  Glaucoma suspect of both eyes I will refer patient to general  ophthalmology, agrees with ophthalmology.  Needs good general eye examination but with particular attention to glaucoma with a family history and with a subtle change in the optic nerve appearance over the last 2 years The cup-to-disc  change from 0.3-0.35 the last 2 years.       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 Vitreomacular adhesion of left eye  H43.822 OCT, Retina - OU - Both Eyes  3. Type 2 diabetes mellitus with hyperglycemia, without long-term current use of insulin (HCC)  E11.65   4. Nuclear sclerotic cataract of both eyes  H25.13   5. Glaucoma suspect of both eyes  H40.003     1. Refer to Dr. Luberta Mutter  2.  3.  Ophthalmic Meds Ordered this visit:  No orders of the defined types were placed in this encounter.      Return in about 1 year (around 10/15/2021) for DILATE OU, OCT.  Patient Instructions  Old instructed patient to follow-up with general ophthalmology for confirmatory glaucoma evaluation.  The cup-to-disc  change from 0.3-0.35 the last 2 years.    Explained the diagnoses, plan, and follow up with the patient and they expressed understanding.  Patient expressed understanding of the importance of proper follow up care.   Clent Demark Adiel Erney M.D. Diseases & Surgery of the Retina and Vitreous Retina & Diabetic [place] 10/15/20     Abbreviations: M myopia (nearsighted); A astigmatism; H hyperopia (farsighted); P presbyopia; Mrx spectacle prescription;  CTL contact lenses; OD right eye; OS left eye; OU both eyes  XT exotropia; ET esotropia; PEK punctate epithelial keratitis; PEE punctate epithelial erosions; DES dry eye syndrome; MGD meibomian gland dysfunction; ATs artificial tears; PFAT's preservative free artificial tears; [place] nuclear sclerotic cataract; PSC posterior subcapsular cataract; ERM epi-retinal membrane; PVD  posterior vitreous detachment; RD retinal detachment; DM diabetes mellitus; DR diabetic retinopathy; NPDR non-proliferative diabetic retinopathy; PDR proliferative diabetic retinopathy; CSME clinically significant macular edema; DME diabetic macular edema; dbh dot blot hemorrhages; CWS cotton wool spot; POAG primary open angle glaucoma; C/D cup-to-disc ratio; HVF humphrey visual field; GVF goldmann visual field; OCT optical coherence tomography; IOP intraocular pressure; BRVO Branch retinal vein occlusion; CRVO central retinal vein occlusion; CRAO central retinal artery occlusion; BRAO branch retinal artery occlusion; RT retinal tear; SB scleral buckle; PPV pars plana vitrectomy; VH Vitreous hemorrhage; PRP panretinal laser photocoagulation; IVK intravitreal kenalog; VMT vitreomacular traction; MH Macular hole;  NVD neovascularization of the disc; NVE neovascularization elsewhere; AREDS age related eye disease study; ARMD age related macular degeneration; POAG primary open angle glaucoma; EBMD epithelial/anterior basement membrane dystrophy; ACIOL anterior chamber intraocular lens; IOL intraocular lens; PCIOL posterior chamber intraocular lens; Phaco/IOL phacoemulsification with intraocular lens placement; [place] photorefractive keratectomy; LASIK laser assisted in situ keratomileusis; HTN hypertension; DM diabetes mellitus; COPD chronic obstructive pulmonary disease

## 2020-10-15 NOTE — Patient Instructions (Signed)
Old instructed patient to follow-up with general ophthalmology for confirmatory glaucoma evaluation.  The cup-to-disc  change from 0.3-0.35 the last 2 years.

## 2020-10-15 NOTE — Assessment & Plan Note (Signed)
Minor, not visually significant, watch the anterior chamber peripheral depth however

## 2020-10-16 ENCOUNTER — Other Ambulatory Visit: Payer: Self-pay

## 2020-10-16 ENCOUNTER — Ambulatory Visit (INDEPENDENT_AMBULATORY_CARE_PROVIDER_SITE_OTHER): Payer: 59 | Admitting: Endocrinology

## 2020-10-16 ENCOUNTER — Encounter: Payer: Self-pay | Admitting: Endocrinology

## 2020-10-16 VITALS — BP 138/86 | HR 86 | Wt 152.0 lb

## 2020-10-16 DIAGNOSIS — Z23 Encounter for immunization: Secondary | ICD-10-CM

## 2020-10-16 DIAGNOSIS — E1165 Type 2 diabetes mellitus with hyperglycemia: Secondary | ICD-10-CM | POA: Diagnosis not present

## 2020-10-16 DIAGNOSIS — R945 Abnormal results of liver function studies: Secondary | ICD-10-CM

## 2020-10-16 NOTE — Progress Notes (Signed)
Patient ID: Joe Boyd, male   DOB: [birth year], 44 y.o.   MRN: [id number]           Reason for Appointment: Follow-up visit for Type 2 Diabetes   History of Present Illness:          Diagnosis: Type 2 diabetes mellitus, date of diagnosis: 2012        Past history:  At time of diagnosis he was having symptoms of increased thirst and urination and he was seen by his physician when he started feeling weak and dizzy also. His A1c at diagnosis was 12.8. He thinks he was treated with metformin only and also he started changing his diet along with eliminating sweet soft drinks.  Initially was treated with only 500 mg of metformin ER and in 2014 this was increased to 1000 mg.  He has not had any other medications for diabetes. He was last seen by his endocrinologist in [place] in 07/2014 when his A1c was 6.5 and no changes were made A1c of over 7%  in 7/16 and he was started on Kombiglyze XR In 6/17 with his A1c going up to 7.6 he was given Glyset 25 mg at lunch also in addition to [employer]  Recent history:   A1c in August was 9.5 Fructosamine is now 327        Oral hypoglycemic drugs the patient is taking are:  Acarbose 50 mg at breakfast and 50 mg at lunch , Januvia 100 mg daily, Amaryl 0.5 mg at bedtime  Current management, blood sugar patterns and problems identified:  His blood sugars were out of control on the last visit because of not taking his medications and poor diet as well as lack of exercise  As judged by his fructosamine and some of his blood sugars his control appears to be improving  Although he thinks he is trying to get back on his diet he is now eating 3 meals a day instead of 2 and usually some carbohydrate every meal  He has not taken his acarbose consistently at dinnertime and also not taking 2 tablets at lunch as previously directed  Because of relatively high blood sugars he was also given low-dose Amaryl on his last visit to take at bedtime  With this  his fasting readings have been as low as 111  He is trying to exercise on his treadmill up to 5 days a week now  His weight is slightly increased  Did have a reading of 155 after his shoulder steroid injection this month  Has not checked any readings after meals especially dinner  Lab afternoon glucose was 132      Side effects from medications have been: None   Glucose monitoring:  done occasionally,        Glucometer:  Contour      Blood Glucose readings:   AVERAGE for 30 days = 133 Recent range 88-173, mostly checking in the morning and afternoon  Previous data:  PRE-MEAL Fasting Lunch Dinner Bedtime Overall  Glucose range:  142-255  155     Mean/median:  189     201   POST-MEAL PC Breakfast PC Lunch PC Dinner  Glucose range:    90-381  Mean/median:    214    Self-care:  Meals:  1-2 meals per day.  Tries to get some protein with his lunch, sometimes yogurt, sometimes has chicken.    Usually avoiding snacks  Dietician visit, most recent: 7/16                Weight history:   Wt Readings from Last 3 Encounters:  10/16/20 152 lb (68.9 kg)  10/01/20 154 lb (69.9 kg)  09/23/20 151 lb (68.5 kg)    Glycemic control:   Lab Results  Component Value Date   HGBA1C 9.5 (H) 08/19/2020   HGBA1C 7.1 (H) 04/15/2020   HGBA1C 7.1 (H) 01/10/2020   Lab Results  Component Value Date   MICROALBUR 0.8 04/15/2020   LDLCALC 44 08/19/2020   CREATININE 1.16 10/14/2020   Lab on 10/14/2020  Component Date Value Ref Range Status  . Fructosamine 10/14/2020 324* 0 - 285 umol/L Final   Comment: Published reference interval for apparently healthy subjects between age 67 and 44 is 55 - 285 umol/L and in a poorly controlled diabetic population is 228 - 563 umol/L with a mean of 396 umol/L.   Marland Kitchen Sodium 10/14/2020 141  135 - 145 mEq/L Final  . Potassium 10/14/2020 4.4  3.5 - 5.1 mEq/L Final  . Chloride 10/14/2020 102  96 - 112 mEq/L Final  . CO2 10/14/2020 32   19 - 32 mEq/L Final  . Glucose, Bld 10/14/2020 132* 70 - 99 mg/dL Final  . BUN 10/14/2020 21  6 - 23 mg/dL Final  . Creatinine, Ser 10/14/2020 1.16  0.40 - 1.50 mg/dL Final  . GFR 10/14/2020 74.88  >60.00 mL/min Final  . Calcium 10/14/2020 10.8* 8.4 - 10.5 mg/dL Final      Allergies as of 10/16/2020   No Known Allergies     Medication List       Accurate as of October 16, 2020  8:25 PM. If you have any questions, ask your nurse or doctor.        acarbose 50 MG tablet Commonly known as: PRECOSE Take 2 tablet by mouth daily at lunch.  May also take 1 tab at breakfast and dinner if eating   atorvastatin 10 MG tablet Commonly known as: LIPITOR Take 1 tablet (10 mg total) by mouth daily.   ferrous sulfate 325 (65 FE) MG tablet Take 1 tablet (325 mg total) by mouth 2 (two) times daily with a meal.   glimepiride 1 MG tablet Commonly known as: Amaryl Take 1 tablet (1 mg total) by mouth at bedtime.   multivitamin with minerals Tabs tablet Take 1 tablet by mouth daily.   nitroGLYCERIN 0.2 mg/hr patch Commonly known as: NITRODUR - Dosed in mg/24 hr Apply 1/4 patch daily to tendon for tendonitis.   OneTouch Delica Lancets 46E Misc Use to check blood sugars daily-Dx code E11.65   OneTouch Verio Sync System w/Device Kit Use to check blood sugars daily-Dx code E11.65   OneTouch Verio test strip Generic drug: glucose blood Use to check blood sugars daily-Dx code E11.65   Probiotic-10 Chew Chew 2 capsules by mouth at bedtime.   sitaGLIPtin 100 MG tablet Commonly known as: JANUVIA Take 1 tablet (100 mg total) by mouth daily.       Allergies: No Known Allergies  Past Medical History:  Diagnosis Date  . Diabetes mellitus without complication ([place])   . Tuberculosis    Finished Rx in 8/15    No past surgical history on file.  Family History  Problem Relation Age of Onset  . Diabetes Mother   . Hypertension Mother   . Thyroid disease Mother   . Heart disease  Mother   . Diabetes Father   .  Hypertension Father   . Heart disease Father   . Breast cancer Sister        68s  . Heart disease Paternal Uncle   . Cancer Neg Hx   . Colon cancer Neg Hx     Social History:  reports that he has never smoked. He has never used smokeless tobacco. He reports current alcohol use. He reports that he does not use drugs.    Review of Systems        Lipids:  LDL and triglycerides have been variable Started on atorvastatin in 4/21 because of high LDL and high risk factors LDL is now controlled although HDL low       Lab Results  Component Value Date   CHOL 101 08/19/2020   CHOL 168 04/15/2020   CHOL 110 01/05/2019   Lab Results  Component Value Date   HDL 37.30 (L) 08/19/2020   HDL 49.80 04/15/2020   HDL 42.20 01/05/2019   Lab Results  Component Value Date   LDLCALC 44 08/19/2020   LDLCALC 108 (H) 04/15/2020   LDLCALC 52 01/05/2019   Lab Results  Component Value Date   TRIG 100.0 08/19/2020   TRIG 54.0 04/15/2020   TRIG 77.0 01/05/2019   Lab Results  Component Value Date   CHOLHDL 3 08/19/2020   CHOLHDL 3 04/15/2020   CHOLHDL 3 01/05/2019   No results found for: LDLDIRECT                 Thyroid: Has History of a small goiter with normal TSH   Lab Results  Component Value Date   TSH 0.98 10/04/2019   HYPERKALEMIA:  His potassium is previously high at times without obvious cause   Lab Results  Component Value Date   K 4.4 10/14/2020   Increased liver function: Improving with stopping Voltaren previously taken for his shoulder pain  Lab Results  Component Value Date   ALT 64 (H) 09/23/2020   HYPERCALCEMIA: He appears to have had high normal or high calcium levels, no history of kidney stones or renal dysfunction  Takes calcium supplement because of not getting dairy products in diet  Lab Results  Component Value Date   CALCIUM 10.8 (H) 10/14/2020   CALCIUM 10.3 08/19/2020   CALCIUM 10.0 04/15/2020   CALCIUM  9.7 01/10/2020   CALCIUM 10.7 (H) 10/04/2019   CALCIUM 10.0 08/21/2019     LABS:  Lab on 10/14/2020  Component Date Value Ref Range Status  . Fructosamine 10/14/2020 324* 0 - 285 umol/L Final   Comment: Published reference interval for apparently healthy subjects between age 59 and 51 is 21 - 285 umol/L and in a poorly controlled diabetic population is 228 - 563 umol/L with a mean of 396 umol/L.   Marland Kitchen Sodium 10/14/2020 141  135 - 145 mEq/L Final  . Potassium 10/14/2020 4.4  3.5 - 5.1 mEq/L Final  . Chloride 10/14/2020 102  96 - 112 mEq/L Final  . CO2 10/14/2020 32  19 - 32 mEq/L Final  . Glucose, Bld 10/14/2020 132* 70 - 99 mg/dL Final  . BUN 10/14/2020 21  6 - 23 mg/dL Final  . Creatinine, Ser 10/14/2020 1.16  0.40 - 1.50 mg/dL Final  . GFR 10/14/2020 74.88  >60.00 mL/min Final  . Calcium 10/14/2020 10.8* 8.4 - 10.5 mg/dL Final    Physical Examination:  BP 138/86   Pulse 86   Wt 152 lb (68.9 kg)   SpO2 97%   BMI 23.81 kg/m  ASSESSMENT:  Diabetes type 2 , nonobese See history of present illness for detailed discussion of current diabetes management, blood sugar patterns and problems identified  His A1c previously 9.5 Fructosamine of 324 indicate some improvement in blood sugars   With getting back on his diet, medications and exercise his blood sugars are improving although his monitoring is inadequate May still have some high postprandial readings from not taking acarbose with every meal Also still benefiting from taking bedtime low-dose Amaryl as morning sugar is generally fairly controlled  Hypercalcemia: May have mild hyperparathyroidism and will evaluate PTH on the next visit Asymptomatic currently  High liver function: Likely from taking Voltaren tablets, improved more recently  High normal blood pressure: We will continue to follow    PLAN:   He will need to take his acarbose at every meal If his blood sugars are over 160 consistently after lunch  he will need to take 2 tablets at that time Encouraged him to add more protein like low-fat dairy products with every meal Continue regular exercise   Follow-up in 2 months    Elayne Snare 10/16/2020, 8:25 PM   Note: This office note was prepared with Dragon voice recognition system technology. Any transcriptional errors that result from this process are unintentional.

## 2020-10-16 NOTE — Patient Instructions (Addendum)
Check blood sugars on waking up 2-3 days a week  Also check blood sugars about 2 hours after meals and do this after different meals by rotation  Recommended blood sugar levels on waking up are 90-130 and about 2 hours after meal is 130-160  Please bring your blood sugar monitor to each visit, thank you  Walk daily  Calcium not needed

## 2020-10-23 ENCOUNTER — Other Ambulatory Visit: Payer: Self-pay | Admitting: Endocrinology

## 2020-11-27 ENCOUNTER — Telehealth (INDEPENDENT_AMBULATORY_CARE_PROVIDER_SITE_OTHER): Payer: 59 | Admitting: Physician Assistant

## 2020-11-27 ENCOUNTER — Encounter: Payer: Self-pay | Admitting: Physician Assistant

## 2020-11-27 ENCOUNTER — Other Ambulatory Visit: Payer: Self-pay

## 2020-11-27 DIAGNOSIS — R059 Cough, unspecified: Secondary | ICD-10-CM

## 2020-11-27 MED ORDER — AZITHROMYCIN 250 MG PO TABS: ORAL_TABLET | ORAL | 0 refills | Status: DC

## 2020-11-27 NOTE — Progress Notes (Signed)
Virtual Visit via Video   I connected with Joe Boyd on 11/27/20 at  2:00 PM EST by a video enabled telemedicine application and verified that I am speaking with the correct person using two identifiers. Location patient: Home Location provider: [facility], Office Persons participating in the virtual visit: Joe Boyd, [place] [name] I discussed the limitations of evaluation and management by telemedicine and the availability of in person appointments. The patient expressed understanding and agreed to proceed.   Subjective:   HPI:   URI A week and half ago he developed dry cough, sinus pressure. Has not had fever but has had some chills. Started taking nyquil and claritin with minimal relief. Normal appetite. Yesterday and today has had a bad headache. Denies sick contacts.  Denies: chest pain, SOB, productive cough, malaise, n/v/d  Fully covid vaccinated.  ROS: See pertinent positives and negatives per HPI.  Patient Active Problem List   Diagnosis Date Noted  . Nuclear sclerotic cataract of both eyes 10/15/2020  . Glaucoma suspect of both eyes 10/15/2020  . Thumb pain, left 11/16/2018  . Lumbar pain with radiation down right leg 11/16/2018  . Refusal of statin medication by patient 10/20/2018  . Type 2 diabetes mellitus with hyperglycemia ([place]) 02/23/2015    Social History   Tobacco Use  . Smoking status: Never Smoker  . Smokeless tobacco: Never Used  Substance Use Topics  . Alcohol use: Yes    Comment: occasionally    Current Outpatient Medications:  .  acarbose (PRECOSE) 50 MG tablet, Take 2 tablet by mouth daily at lunch.  May also take 1 tab at breakfast and dinner if eating, Disp: 360 tablet, Rfl: 1 .  atorvastatin (LIPITOR) 10 MG tablet, Take 1 tablet (10 mg total) by mouth daily., Disp: 90 tablet, Rfl: 3 .  Blood Glucose Monitoring Suppl ([place]) w/Device KIT, Use to check blood sugars daily-Dx code E11.65, Disp: 1 kit,  Rfl: 1 .  ferrous sulfate 325 (65 FE) MG tablet, Take 1 tablet (325 mg total) by mouth 2 (two) times daily with a meal., Disp: 60 tablet, Rfl: 2 .  glimepiride (AMARYL) 1 MG tablet, Take 1 tablet (1 mg total) by mouth at bedtime., Disp: 30 tablet, Rfl: 3 .  glucose blood (ONETOUCH VERIO) test strip, Use to check blood sugars daily-Dx code E11.65, Disp: 100 each, Rfl: 1 .  JANUVIA 100 MG tablet, Take 1 tablet by mouth daily, Disp: 90 tablet, Rfl: 0 .  Multiple Vitamin (MULTIVITAMIN WITH MINERALS) TABS tablet, Take 1 tablet by mouth daily. , Disp: , Rfl:  .  nitroGLYCERIN (NITRODUR - DOSED IN MG/24 HR) 0.2 mg/hr patch, Apply 1/4 patch daily to tendon for tendonitis., Disp: 30 patch, Rfl: 1 .  OneTouch Delica Lancets 16X MISC, Use to check blood sugars daily-Dx code E11.65, Disp: 100 each, Rfl: 1 .  Probiotic Product (PROBIOTIC-10) CHEW, Chew 2 capsules by mouth at bedtime. , Disp: , Rfl:  .  azithromycin (ZITHROMAX) 250 MG tablet, Take two tablets on day one, then one daily x 4 days, Disp: 6 tablet, Rfl: 0  No Known Allergies  Objective:   VITALS: Per patient if applicable, see vitals. GENERAL: Alert, appears well and in no acute distress. HEENT: Atraumatic, conjunctiva clear, no obvious abnormalities on inspection of external nose and ears. NECK: Normal movements of the head and neck. CARDIOPULMONARY: No increased WOB. Speaking in clear sentences. I:E ratio WNL.  MS: Moves all visible extremities without noticeable abnormality. PSYCH:  Pleasant and cooperative, well-groomed. Speech normal rate and rhythm. Affect is appropriate. Insight and judgement are appropriate. Attention is focused, linear, and appropriate.  NEURO: CN grossly intact. Oriented as arrived to appointment on time with no prompting. Moves both UE equally.  SKIN: No obvious lesions, wounds, erythema, or cyanosis noted on face or hands.  Assessment and Plan:   [name] was seen today for cough and headache.  Diagnoses and  all orders for this visit:  Cough  Other orders -     azithromycin (ZITHROMAX) 250 MG tablet; Take two tablets on day one, then one daily x 4 days   No red flags on discussion, patient is not in any obvious distress during our visit.  Will treat with oral azithromycin to cover for possible sinusitis/bronchitis.  Discussed over the counter supportive care options, with recommendations to push fluids and rest. Reviewed return precautions including new/worsening fever, SOB, new/worsening cough or other concerns.  Recommended need to self-quarantine and practice social distancing until symptoms resolve.  Discussed current recommendations for COVID testing --> we are calling to have him scheduled for tomorrow after noon.  I recommend that patient follow-up if symptoms worsen or persist despite treatment x 7-10 days, sooner if needed.  I discussed the assessment and treatment plan with the patient. The patient was provided an opportunity to ask questions and all were answered. The patient agreed with the plan and demonstrated an understanding of the instructions.   The patient was advised to call back or seek an in-person evaluation if the symptoms worsen or if the condition fails to improve as anticipated.     [place], [place] 11/27/2020

## 2020-11-28 ENCOUNTER — Other Ambulatory Visit: Payer: 59

## 2020-11-28 DIAGNOSIS — Z20822 Contact with and (suspected) exposure to covid-19: Secondary | ICD-10-CM

## 2020-11-29 ENCOUNTER — Encounter (HOSPITAL_BASED_OUTPATIENT_CLINIC_OR_DEPARTMENT_OTHER): Payer: Self-pay

## 2020-11-29 ENCOUNTER — Emergency Department (HOSPITAL_BASED_OUTPATIENT_CLINIC_OR_DEPARTMENT_OTHER)
Admission: EM | Admit: 2020-11-29 | Discharge: 2020-11-29 | Disposition: A | Discharge status: Home / Self Care | Payer: 59 | Attending: Emergency Medicine | Admitting: Emergency Medicine

## 2020-11-29 ENCOUNTER — Emergency Department (HOSPITAL_BASED_OUTPATIENT_CLINIC_OR_DEPARTMENT_OTHER): Payer: 59

## 2020-11-29 ENCOUNTER — Other Ambulatory Visit: Payer: Self-pay

## 2020-11-29 DIAGNOSIS — Z20822 Contact with and (suspected) exposure to covid-19: Secondary | ICD-10-CM | POA: Insufficient documentation

## 2020-11-29 DIAGNOSIS — J189 Pneumonia, unspecified organism: Secondary | ICD-10-CM | POA: Insufficient documentation

## 2020-11-29 DIAGNOSIS — E1165 Type 2 diabetes mellitus with hyperglycemia: Secondary | ICD-10-CM | POA: Insufficient documentation

## 2020-11-29 DIAGNOSIS — Z7984 Long term (current) use of oral hypoglycemic drugs: Secondary | ICD-10-CM | POA: Insufficient documentation

## 2020-11-29 DIAGNOSIS — R059 Cough, unspecified: Secondary | ICD-10-CM | POA: Diagnosis present

## 2020-11-29 LAB — CBC WITH DIFFERENTIAL/PLATELET
Abs Immature Granulocytes: 0.03 10*3/uL (ref 0.00–0.07)
Basophils Absolute: 0.1 10*3/uL (ref 0.0–0.1)
Basophils Relative: 1 %
Eosinophils Absolute: 0.1 10*3/uL (ref 0.0–0.5)
Eosinophils Relative: 1 %
HCT: 40.1 % (ref 39.0–52.0)
Hemoglobin: 12.3 g/dL — ABNORMAL LOW (ref 13.0–17.0)
Immature Granulocytes: 0 %
Lymphocytes Relative: 20 %
Lymphs Abs: 2.4 10*3/uL (ref 0.7–4.0)
MCH: 21.3 pg — ABNORMAL LOW (ref 26.0–34.0)
MCHC: 30.7 g/dL (ref 30.0–36.0)
MCV: 69.5 fL — ABNORMAL LOW (ref 80.0–100.0)
Monocytes Absolute: 1.1 10*3/uL — ABNORMAL HIGH (ref 0.1–1.0)
Monocytes Relative: 9 %
Neutro Abs: 8.3 10*3/uL — ABNORMAL HIGH (ref 1.7–7.7)
Neutrophils Relative %: 69 %
Platelets: 186 10*3/uL (ref 150–400)
RBC: 5.77 MIL/uL (ref 4.22–5.81)
RDW: 16.1 % — ABNORMAL HIGH (ref 11.5–15.5)
Smear Review: NORMAL
WBC: 11.9 10*3/uL — ABNORMAL HIGH (ref 4.0–10.5)
nRBC: 0 % (ref 0.0–0.2)

## 2020-11-29 LAB — BASIC METABOLIC PANEL
Anion gap: 9 (ref 5–15)
BUN: 10 mg/dL (ref 6–20)
CO2: 27 mmol/L (ref 22–32)
Calcium: 9.1 mg/dL (ref 8.9–10.3)
Chloride: 100 mmol/L (ref 98–111)
Creatinine, Ser: 1.1 mg/dL (ref 0.61–1.24)
GFR, Estimated: >60 mL/min (ref >60)
Glucose, Bld: 162 mg/dL — ABNORMAL HIGH (ref 70–99)
Potassium: 4.2 mmol/L (ref 3.5–5.1)
Sodium: 136 mmol/L (ref 135–145)

## 2020-11-29 LAB — RESP PANEL BY RT-PCR (FLU A&B, COVID) ARPGX2
Influenza A by PCR: NEGATIVE
Influenza B by PCR: NEGATIVE
SARS Coronavirus 2 by RT PCR: NEGATIVE

## 2020-11-29 LAB — SARS-COV-2, NAA 2 DAY TAT

## 2020-11-29 LAB — TROPONIN I (HIGH SENSITIVITY): Troponin I (High Sensitivity): 3 ng/L (ref <18)

## 2020-11-29 LAB — NOVEL CORONAVIRUS, NAA: SARS-CoV-2, NAA: NOT DETECTED

## 2020-11-29 MED ORDER — BENZONATATE 100 MG PO CAPS: 100.0000 mg | ORAL_CAPSULE | Freq: Three times a day (TID) | ORAL | 0 refills | Status: DC

## 2020-11-29 MED ORDER — AMOXICILLIN-POT CLAVULANATE 875-125 MG PO TABS: 1.0000 | ORAL_TABLET | Freq: Two times a day (BID) | ORAL | 0 refills | Status: AC

## 2020-11-29 MED ORDER — ACETAMINOPHEN 325 MG PO TABS
650.0000 mg | ORAL_TABLET | Freq: Once | ORAL | Status: AC
  Administered 2020-11-29: 650 mg via ORAL
  Filled 2020-11-29: qty 2

## 2020-11-29 MED ORDER — AMOXICILLIN-POT CLAVULANATE 875-125 MG PO TABS: 1.0000 | ORAL_TABLET | Freq: Once | ORAL | Status: DC

## 2020-11-29 MED ORDER — ALBUTEROL SULFATE HFA 108 (90 BASE) MCG/ACT IN AERS
INHALATION_SPRAY | RESPIRATORY_TRACT | Status: AC
  Administered 2020-11-29: 2
  Filled 2020-11-29: qty 6.7

## 2020-11-29 MED ORDER — AMOXICILLIN-POT CLAVULANATE 875-125 MG PO TABS
1.0000 | ORAL_TABLET | Freq: Once | ORAL | Status: AC
  Administered 2020-11-29: 1 via ORAL
  Filled 2020-11-29: qty 1

## 2020-11-29 NOTE — ED Triage Notes (Addendum)
Pt c/o flu like sx x 1 week-states PCP visit-he is taking abx and neg covid test resulted today-NAD-steady gait

## 2020-11-29 NOTE — ED Notes (Signed)
No answer from wtg room when his name was called for xray. Patient was not near pharmacy or vending machines either.

## 2020-11-29 NOTE — Discharge Instructions (Addendum)
At this time there does not appear to be the presence of an emergent medical condition, however there is always the potential for conditions to change. Please read and follow the below instructions.  Please return to the Emergency Department immediately for any new or worsening symptoms. Please be sure to follow up with your Primary Care Provider within one week regarding your visit today; please call their office to schedule an appointment even if you are feeling better for a follow-up visit. Please take your antibiotic Augmentin as prescribed until complete to help with your symptoms.  Please drink enough water to avoid dehydration and get plenty of rest.  Please also finish your azithromycin prescription as your primary care provider prescribed You may use the albuterol inhaler given to you today to help with any wheezing you experience, if you use this medication and feel it is not helping your shortness of breath return to the ER immediately for evaluation. You may use the medication Tessalon to help with your cough.  Go to the nearest Emergency Department immediately if: You have fever or chills You are short of breath and it gets worse. You have more chest pain. Your sickness gets worse. This is very serious if: You are an older adult. Your body's defense system is weak. You cough up blood. You have any new/concerning or worsening of symptoms  Please read the additional information packets attached to your discharge summary.  Do not take your medicine if  develop an itchy rash, swelling in your mouth or lips, or difficulty breathing; call 911 and seek immediate emergency medical attention if this occurs.  You may review your lab tests and imaging results in their entirety on your MyChart account.  Please discuss all results of fully with your primary care provider and other specialist at your follow-up visit.  Note: Portions of this text may have been transcribed using voice  recognition software. Every effort was made to ensure accuracy; however, inadvertent computerized transcription errors may still be present.

## 2020-11-29 NOTE — ED Provider Notes (Signed)
[facility] EMERGENCY DEPARTMENT Provider Note   CSN: [id number] Arrival date & time: 11/29/20  1830     History Chief Complaint  Patient presents with  . Cough    Joe Boyd is a 44 y.o. male history of diabetes and treated tuberculosis.  Patient reports 7 days ago he developed symptoms of URI including tickle in the back of his throat, nonproductive cough and body aches, symptoms have been progressing since onset.  He reports cough has become severe over the last 3-4 days, constant nonproductive associated with shortness of breath during coughing fits.  He also describes central chest tightness when he coughs this is mild improves without intervention.  He reports that he saw his primary care provider 3 days ago and was started on a Z-Pak but this has not helped his symptoms.  Associated with fevers and one episode of nonbloody/nonbilious vomiting after coughing earlier today.  Denies headache, vision changes, sore throat, hemoptysis, abdominal pain, diarrhea, extremity swelling/color change, history of blood clot, history of cancer, denies hormone use, recent surgery/immobilization or any additional concerns.  Patient reports he has had 2 Covid vaccines. HPI     Past Medical History:  Diagnosis Date  . Diabetes mellitus without complication ([place])   . Tuberculosis    Finished Rx in 8/15    Patient Active Problem List   Diagnosis Date Noted  . Nuclear sclerotic cataract of both eyes 10/15/2020  . Glaucoma suspect of both eyes 10/15/2020  . Thumb pain, left 11/16/2018  . Lumbar pain with radiation down right leg 11/16/2018  . Refusal of statin medication by patient 10/20/2018  . Type 2 diabetes mellitus with hyperglycemia ([place]) 02/23/2015    History reviewed. No pertinent surgical history.     Family History  Problem Relation Age of Onset  . Diabetes Mother   . Hypertension Mother   . Thyroid disease Mother   . Heart disease Mother   . Diabetes  Father   . Hypertension Father   . Heart disease Father   . Breast cancer Sister        60s  . Heart disease Paternal Uncle   . Cancer Neg Hx   . Colon cancer Neg Hx     Social History   Tobacco Use  . Smoking status: Never Smoker  . Smokeless tobacco: Never Used  Vaping Use  . Vaping Use: Never used  Substance Use Topics  . Alcohol use: Yes    Comment: occasionally  . Drug use: No    Home Medications Prior to Admission medications   Medication Sig Start Date End Date Taking? Authorizing Provider  acarbose (PRECOSE) 50 MG tablet Take 2 tablet by mouth daily at lunch.  May also take 1 tab at breakfast and dinner if eating 05/13/20   Elayne Snare, MD  amoxicillin-clavulanate (AUGMENTIN) 875-125 MG tablet Take 1 tablet by mouth every 12 (twelve) hours for 7 days. 11/29/20 12/06/20  Nuala Alpha A, [name]  atorvastatin (LIPITOR) 10 MG tablet Take 1 tablet (10 mg total) by mouth daily. 04/18/20   Elayne Snare, MD  azithromycin [facility]) 250 MG tablet Take two tablets on day one, then one daily x 4 days 11/27/20   Inda Coke, PA  benzonatate (TESSALON) 100 MG capsule Take 1 capsule (100 mg total) by mouth every 8 (eight) hours. 11/29/20   Nuala Alpha A, [name]  Blood Glucose Monitoring Suppl [facility] VERIO [place]) w/Device KIT Use to check blood sugars daily-Dx code E11.65 08/29/20   Dwyane Dee,  Vicenta Aly, MD  ferrous sulfate 325 (65 FE) MG tablet Take 1 tablet (325 mg total) by mouth 2 (two) times daily with a meal. 11/28/19   Ladene Artist, MD  glimepiride (AMARYL) 1 MG tablet Take 1 tablet (1 mg total) by mouth at bedtime. 08/21/20   Elayne Snare, MD  glucose blood [facility] VERIO) test strip Use to check blood sugars daily-Dx code E11.65 08/29/20   Elayne Snare, MD  JANUVIA 100 MG tablet Take 1 tablet by mouth daily 10/23/20   Elayne Snare, MD  Multiple Vitamin (MULTIVITAMIN WITH MINERALS) TABS tablet Take 1 tablet by mouth daily.     [provider]  nitroGLYCERIN (NITRODUR -  DOSED IN MG/24 HR) 0.2 mg/hr patch Apply 1/4 patch daily to tendon for tendonitis. 09/23/20   Gregor Hams, MD  OneTouch Delica Lancets 92K MISC Use to check blood sugars daily-Dx code E11.65 08/29/20   Elayne Snare, MD  Probiotic Product (PROBIOTIC-10) CHEW Chew 2 capsules by mouth at bedtime.     [provider]    Allergies    Patient has no known allergies.  Review of Systems   Review of Systems Ten systems are reviewed and are negative for acute change except as noted in the HPI  Physical Exam Updated Vital Signs BP 131/88   Pulse 83   Temp 98.9 F (37.2 C) (Oral)   Resp 18   SpO2 100%   Physical Exam Constitutional:      General: He is not in acute distress.    Appearance: Normal appearance. He is well-developed. He is not ill-appearing or diaphoretic.  HENT:     Head: Normocephalic and atraumatic.     Right Ear: External ear normal.     Left Ear: External ear normal.  Eyes:     General: Vision grossly intact. Gaze aligned appropriately.     Pupils: Pupils are equal, round, and reactive to light.  Neck:     Trachea: Trachea and phonation normal.  Cardiovascular:     Rate and Rhythm: Normal rate and regular rhythm.  Pulmonary:     Effort: Pulmonary effort is normal. No accessory muscle usage or respiratory distress.     Breath sounds: Normal air entry. Examination of the right-lower field reveals rhonchi. Wheezing (Mild expiratory) and rhonchi present.  Abdominal:     General: There is no distension.     Palpations: Abdomen is soft.     Tenderness: There is no abdominal tenderness. There is no guarding or rebound.  Musculoskeletal:        General: Normal range of motion.     Cervical back: Normal range of motion.     Right lower leg: No edema.     Left lower leg: No edema.  Skin:    General: Skin is warm and dry.  Neurological:     Mental Status: He is alert.     GCS: GCS eye subscore is 4. GCS verbal subscore is 5. GCS motor subscore is 6.     Comments:  Speech is clear and goal oriented, follows commands Major Cranial nerves without deficit, no facial droop Moves extremities without ataxia, coordination intact  Psychiatric:        Behavior: Behavior normal.     ED Results / Procedures / Treatments   Labs (all labs ordered are listed, but only abnormal results are displayed) Labs Reviewed  CBC WITH DIFFERENTIAL/PLATELET - Abnormal; Notable for the following components:      Result Value  WBC 11.9 (*)    Hemoglobin 12.3 (*)    MCV 69.5 (*)    MCH 21.3 (*)    RDW 16.1 (*)    Neutro Abs 8.3 (*)    Monocytes Absolute 1.1 (*)    All other components within normal limits  BASIC METABOLIC PANEL - Abnormal; Notable for the following components:   Glucose, Bld 162 (*)    All other components within normal limits  RESP PANEL BY RT-PCR (FLU A&B, COVID) ARPGX2  TROPONIN I (HIGH SENSITIVITY)    EKG EKG Interpretation  Date/Time:  Friday November 29 2020 18:54:50 EST Ventricular Rate:  84 PR Interval:  138 QRS Duration: 66 QT Interval:  332 QTC Calculation: 392 R Axis:   54 Text Interpretation: Normal sinus rhythm Normal ECG No STEMI Confirmed by Nanda Quinton [phone]) on 11/29/2020 7:03:45 PM   Radiology DG Chest 2 View  Result Date: 11/29/2020 CLINICAL DATA:  44 year old male with cough and shortness of breath. EXAM: CHEST - 2 VIEW COMPARISON:  Chest radiograph dated 07/26/2014. FINDINGS: There is a patchy area of opacity in the right lung, likely involving the superior segment of the right lower lobe or inferior aspect of the right upper lobe consistent with pneumonia. Clinical correlation and follow-up to resolution recommended. The left lung is clear. There is no pleural effusion pneumothorax. The cardiac silhouette is within limits. No acute osseous pathology. IMPRESSION: Right lung pneumonia. Electronically Signed   By: Anner Crete M.D.   On: 11/29/2020 20:27    Procedures Procedures (including critical care  time)  Medications Ordered in ED Medications  amoxicillin-clavulanate (AUGMENTIN) 875-125 MG per tablet 1 tablet (has no administration in time range)  acetaminophen (TYLENOL) tablet 650 mg (650 mg Oral Given 11/29/20 1855)  albuterol (VENTOLIN HFA) 108 (90 Base) MCG/ACT inhaler (2 puffs  Given 11/29/20 1901)    ED Course  I have reviewed the triage vital signs and the nursing notes.  Pertinent labs & imaging results that were available during my care of the patient were reviewed by me and considered in my medical decision making (see chart for details).    MDM Rules/Calculators/A&P                         Additional history obtained from: 1. Nursing notes from this visit. 2. Review of electronic medical records ------------------ 44 year old diabetic male presents today for 7-day of URI symptoms.  Cough has been worsening, nonproductive.  He reports some shortness of breath and chest tightness during his coughing spells.  He was febrile on arrival and this resolved with some Tylenol.  He has no evidence of DVT today and no high risk factors for pulmonary embolism.  Chest x-ray in triage is consistent with right sided pneumonia.  We will add chest pain work-up and monitor patient.  Patient is on day 3 of Z-Pak prescribed by PCP.  Patient without tachycardia or hypoxia, recent immobilization/surgery, history of PE/DVT, exams hormone use, hemoptysis, history of cancer, evidence of DVT, low risk by [place] and PERC negative. ----------- I ordered, reviewed and interpreted labs which include: Covid/influenza panel negative BMP shows no emergent electrolyte derangement, AKI or gap. CBC shows mild leukocytosis of 11.9, mild anemia of 12.3 Troponin within normal limits, no indication for delta troponin given symptoms ongoing for several days.  CXR:  IMPRESSION:  Right lung pneumonia.   EKG: Normal sinus rhythm Normal ECG No STEMI Confirmed by Nanda Quinton [phone]) on 11/29/2020 7:03:45  PM  Patient ambulated by nursing staff without hypoxia or shortness of breath on room air.  There is no indication for admission, will have patient finish his azithromycin prescription and start patient on Augmentin for treatment of bacterial pneumonia.  Low suspicion for ACS, PE, dissection or other emergent cardiopulmonary etiology patient symptoms today  At this time there does not appear to be any evidence of an acute emergency medical condition and the patient appears stable for discharge with appropriate outpatient follow up. Diagnosis was discussed with patient who verbalizes understanding of care plan and is agreeable to discharge. I have discussed return precautions with patient who verbalizes understanding. Patient encouraged to follow-up with their PCP. All questions answered.  Patient's case discussed with Dr. Laverta Baltimore who agrees with plan to discharge with addition of Augmentin and PCP follow-up.   Note: Portions of this report may have been transcribed using voice recognition software. Every effort was made to ensure accuracy; however, inadvertent computerized transcription errors may still be present. Final Clinical Impression(s) / ED Diagnoses Final diagnoses:  Community acquired pneumonia of right lung, unspecified part of lung    Rx / DC Orders ED Discharge Orders         Ordered    amoxicillin-clavulanate (AUGMENTIN) 875-125 MG tablet  Every 12 hours        11/29/20 2345    benzonatate (TESSALON) 100 MG capsule  Every 8 hours,   Status:  Discontinued        11/29/20 2345    benzonatate (TESSALON) 100 MG capsule  Every 8 hours        11/29/20 2346           Gari Crown 11/29/20 2349    Margette Fast, MD 11/30/20 1121

## 2020-11-29 NOTE — Progress Notes (Signed)
Patient ambulated around the department while on pulse ox.  Patient's SPO2 remained at 100% and HR remained between 90 and 93.  Patient stated that he did not feel short of breath.

## 2020-12-02 ENCOUNTER — Encounter: Payer: Self-pay | Admitting: Physician Assistant

## 2020-12-02 ENCOUNTER — Telehealth: Payer: Self-pay

## 2020-12-02 NOTE — Telephone Encounter (Signed)
Nurse Assessment Nurse: Allie Bossier, RN, [place] Date/Time Joe Boyd Time): 12/01/2020 9:31:28 AM Confirm and document reason for call. If symptomatic, describe symptoms. ---Caller states he has pneumonia and is being treated for it with Amoxicillin and Tessalon pearls. Started the antibiotic on Friday. Now has a rash that is itchy all over his back and in the front. States he also has an inhaler. Does the patient have any new or worsening symptoms? ---Yes Will a triage be completed? ---Yes Related visit to physician within the last 2 weeks? ---Yes Does the PT have any chronic conditions? (i.e. diabetes, asthma, this includes High risk factors for pregnancy, etc.) ---Yes List chronic conditions. ---DM Is the patient pregnant or possibly pregnant? (Ask all females between the ages of 90-55) ---No Is this a behavioral health or substance abuse call? ---No Guidelines Guideline Title Affirmed Question Affirmed Notes Nurse Date/Time (Eastern Time) Rash - Widespread On Drugs Hives or itching Allie Bossier, RN, [place] 12/01/2020 9:34:30 AM Disp. Time Joe Boyd Time) Disposition Final User 12/01/2020 9:38:15 AM See PCP within 24 Hours Yes Allie Bossier, RN, [place] PLEASE NOTE: All timestamps contained within this report are represented as [place] Standard Time. CONFIDENTIALTY NOTICE: This fax transmission is intended only for the addressee. It contains information that is legally privileged, confidential or otherwise protected from use or disclosure. If you are not the intended recipient, you are strictly prohibited from reviewing, disclosing, copying using or disseminating any of this information or taking any action in reliance on or regarding this information. If you have received this fax in error, please notify us immediately by telephone so that we can arrange for its return to [place]. Phone: [phone], Toll-Free: [phone], Fax: [phone] Page: 2 of 2 Call Id: [id number] Caller Disagree/Comply  Comply Caller Understands Yes PreDisposition Call Doctor Care Advice Given Per Guideline STOP THE MEDICATION: ANTIHISTAMINE FOR HIVES OR SEVERE ITCHING: CALL BACK IF: * You become worse Referrals GO TO FACILITY UNDECIDE

## 2020-12-02 NOTE — Telephone Encounter (Signed)
Called patient to get scheduled for an appointment, he wanted to know once he finishes his round of new antibiotics if he can see Lelon Mast in office to discuss his health, and wants to know if he is considered contagious.

## 2020-12-02 NOTE — Telephone Encounter (Signed)
Please schedule OV for the rash.

## 2020-12-03 NOTE — Telephone Encounter (Signed)
Appt scheduled

## 2020-12-10 ENCOUNTER — Encounter: Payer: Self-pay | Admitting: Physician Assistant

## 2020-12-10 ENCOUNTER — Encounter (INDEPENDENT_AMBULATORY_CARE_PROVIDER_SITE_OTHER): Payer: Self-pay | Admitting: Ophthalmology

## 2020-12-10 ENCOUNTER — Ambulatory Visit (INDEPENDENT_AMBULATORY_CARE_PROVIDER_SITE_OTHER): Payer: 59 | Admitting: Physician Assistant

## 2020-12-10 ENCOUNTER — Other Ambulatory Visit: Payer: Self-pay

## 2020-12-10 VITALS — BP 130/80 | HR 82 | Temp 97.8°F | Ht 67.0 in | Wt 155.5 lb

## 2020-12-10 DIAGNOSIS — J189 Pneumonia, unspecified organism: Secondary | ICD-10-CM | POA: Diagnosis not present

## 2020-12-10 NOTE — Patient Instructions (Signed)
It was great to see you!  Let's plan to get the xray around the week of January 17th.  An order for an xray has been put in for you. To get your xray, you can walk in at the [facility] location without a scheduled appointment.  The address is [address] It is across the street from [facility]. X-ray is located in the basement.  Hours of operation are M-F 8:30am to 5:00pm. Please note that they are closed for lunch between 12:30 and 1:00pm.  If you are continuing to need albuterol 3-4 times a day, please let me know.  Take care,  Jarold Motto [name]

## 2020-12-10 NOTE — Telephone Encounter (Signed)
Chart updated

## 2020-12-10 NOTE — Telephone Encounter (Signed)
Could you forward AVS to this Dr?

## 2020-12-10 NOTE — Progress Notes (Signed)
Joe Boyd is a 44 y.o. male is here to follow up on pneumonia.  I acted as a Education administrator for [employer], [name] Anselmo Pickler, LPN   History of Present Illness:   Chief Complaint  Patient presents with  . Pneumonia    HPI  F/u pneumonia Pt here today to follow up from Pneumonia, dx on 12/3 was seen in the ED. He was treated with oral Augmentin (was already on Z-pack at time of visit) and tessalon perles. He had to stop his augmentin due to itching, was given Cefpodoxime 200 mg BID x 5 days. He has taken this as completed.  Denies: fever since starting abx, chest pain, SOB, malaise, fatigue, poor appetite.  Still has lingering dry cough.    Health Maintenance Due  Topic Date Due  . Hepatitis C Screening  Never done  . COVID-19 Vaccine (1) Never done  . FOOT EXAM  10/20/2019  . OPHTHALMOLOGY EXAM  03/05/2020    Past Medical History:  Diagnosis Date  . Diabetes mellitus without complication ([place])   . Tuberculosis    Finished Rx in 8/15     Social History   Tobacco Use  . Smoking status: Never Smoker  . Smokeless tobacco: Never Used  Vaping Use  . Vaping Use: Never used  Substance Use Topics  . Alcohol use: Yes    Comment: occasionally  . Drug use: No    No past surgical history on file.  Family History  Problem Relation Age of Onset  . Diabetes Mother   . Hypertension Mother   . Thyroid disease Mother   . Heart disease Mother   . Diabetes Father   . Hypertension Father   . Heart disease Father   . Breast cancer Sister        36s  . Heart disease Paternal Uncle   . Cancer Neg Hx   . Colon cancer Neg Hx     PMHx, SurgHx, SocialHx, FamHx, Medications, and Allergies were reviewed in the Visit Navigator and updated as appropriate.   Patient Active Problem List   Diagnosis Date Noted  . Nuclear sclerotic cataract of both eyes 10/15/2020  . Glaucoma suspect of both eyes 10/15/2020  . Thumb pain, left 11/16/2018  . Lumbar pain with radiation  down right leg 11/16/2018  . Refusal of statin medication by patient 10/20/2018  . Type 2 diabetes mellitus with hyperglycemia ([place]) 02/23/2015    Social History   Tobacco Use  . Smoking status: Never Smoker  . Smokeless tobacco: Never Used  Vaping Use  . Vaping Use: Never used  Substance Use Topics  . Alcohol use: Yes    Comment: occasionally  . Drug use: No    Current Medications and Allergies:    Current Outpatient Medications:  .  acarbose (PRECOSE) 50 MG tablet, Take 2 tablet by mouth daily at lunch.  May also take 1 tab at breakfast and dinner if eating, Disp: 360 tablet, Rfl: 1 .  albuterol (VENTOLIN HFA) 108 (90 Base) MCG/ACT inhaler, Inhale 2 puffs into the lungs every 6 (six) hours as needed for wheezing or shortness of breath., Disp: , Rfl:  .  atorvastatin (LIPITOR) 10 MG tablet, Take 1 tablet (10 mg total) by mouth daily., Disp: 90 tablet, Rfl: 3 .  benzonatate (TESSALON) 100 MG capsule, Take 1 capsule (100 mg total) by mouth every 8 (eight) hours., Disp: 21 capsule, Rfl: 0 .  Blood Glucose Monitoring Suppl ([place]) w/Device KIT, Use  to check blood sugars daily-Dx code E11.65, Disp: 1 kit, Rfl: 1 .  glimepiride (AMARYL) 1 MG tablet, Take 1 tablet (1 mg total) by mouth at bedtime., Disp: 30 tablet, Rfl: 3 .  glucose blood (ONETOUCH VERIO) test strip, Use to check blood sugars daily-Dx code E11.65, Disp: 100 each, Rfl: 1 .  JANUVIA 100 MG tablet, Take 1 tablet by mouth daily, Disp: 90 tablet, Rfl: 0 .  Multiple Vitamin (MULTIVITAMIN WITH MINERALS) TABS tablet, Take 1 tablet by mouth daily. , Disp: , Rfl:  .  OneTouch Delica Lancets 03E MISC, Use to check blood sugars daily-Dx code E11.65, Disp: 100 each, Rfl: 1 .  nitroGLYCERIN (NITRODUR - DOSED IN MG/24 HR) 0.2 mg/hr patch, Apply 1/4 patch daily to tendon for tendonitis. (Patient not taking: Reported on 12/10/2020), Disp: 30 patch, Rfl: 1   Allergies  Allergen Reactions  . Augmentin  [Amoxicillin-Pot Clavulanate] Itching    Review of Systems   ROS  Negative unless otherwise specified per HPI.  Vitals:   Vitals:   12/10/20 1118  BP: 130/80  Pulse: 82  Temp: 97.8 F (36.6 C)  TempSrc: Temporal  SpO2: 95%  Weight: 155 lb 8 oz (70.5 kg)  Height: '5\' 7"'  (1.702 m)     Body mass index is 24.35 kg/m.   Physical Exam:    Physical Exam Vitals and nursing note reviewed.  Constitutional:      General: He is not in acute distress.    Appearance: He is well-developed. He is not ill-appearing, toxic-appearing or sickly-appearing.  Cardiovascular:     Rate and Rhythm: Normal rate and regular rhythm.     Pulses: Normal pulses.     Heart sounds: Normal heart sounds, S1 normal and S2 normal.     Comments: No LE edema Pulmonary:     Effort: Pulmonary effort is normal.     Breath sounds: Normal breath sounds.  Skin:    General: Skin is warm, dry and intact.  Neurological:     Mental Status: He is alert.     GCS: GCS eye subscore is 4. GCS verbal subscore is 5. GCS motor subscore is 6.  Psychiatric:        Mood and Affect: Mood and affect normal.        Speech: Speech normal.        Behavior: Behavior normal. Behavior is cooperative.      Assessment and Plan:    Tandy was seen today for pneumonia.  Diagnoses and all orders for this visit:  Pneumonia of right lung due to infectious organism, unspecified part of lung -     DG Chest 2 View; Future    Heart and lung exam normal. Appears to be doing well after his course of antibiotics. Vitals stable. Recommend continued use of albuterol. I did offer ICS but he declined at this time. I recommended that he let us know that if he has continued frequent use of albuterol inhaler that we consider ICS. Follow-up CXR ordered for patient to have in 4-6 weeks.   CMA or LPN served as scribe during this visit. History, Physical, and Plan performed by medical provider. The above documentation has been reviewed  and is accurate and complete.   Inda Coke, [name] [place] [place] 12/10/2020  Follow-up: No follow-ups on file.

## 2020-12-17 ENCOUNTER — Other Ambulatory Visit: Payer: Self-pay

## 2020-12-17 ENCOUNTER — Other Ambulatory Visit (INDEPENDENT_AMBULATORY_CARE_PROVIDER_SITE_OTHER): Payer: 59

## 2020-12-17 DIAGNOSIS — E1165 Type 2 diabetes mellitus with hyperglycemia: Secondary | ICD-10-CM

## 2020-12-17 LAB — BASIC METABOLIC PANEL
BUN: 13 mg/dL (ref 6–23)
CO2: 33 mEq/L — ABNORMAL HIGH (ref 19–32)
Calcium: 10.1 mg/dL (ref 8.4–10.5)
Chloride: 106 mEq/L (ref 96–112)
Creatinine, Ser: 1.18 mg/dL (ref 0.40–1.50)
GFR: 73.96 mL/min (ref >60.00)
Glucose, Bld: 121 mg/dL — ABNORMAL HIGH (ref 70–99)
Potassium: 4.7 mEq/L (ref 3.5–5.1)
Sodium: 142 mEq/L (ref 135–145)

## 2020-12-17 LAB — HEMOGLOBIN A1C: Hgb A1c MFr Bld: 7.5 % — ABNORMAL HIGH (ref 4.6–6.5)

## 2020-12-18 LAB — PARATHYROID HORMONE, INTACT (NO CA): PTH: 15 pg/mL (ref 15–65)

## 2020-12-19 ENCOUNTER — Ambulatory Visit: Payer: 59 | Admitting: Endocrinology

## 2020-12-25 ENCOUNTER — Encounter: Payer: Self-pay | Admitting: Physician Assistant

## 2020-12-25 ENCOUNTER — Encounter: Payer: Self-pay | Admitting: Endocrinology

## 2020-12-25 ENCOUNTER — Ambulatory Visit (INDEPENDENT_AMBULATORY_CARE_PROVIDER_SITE_OTHER): Payer: 59 | Admitting: Endocrinology

## 2020-12-25 ENCOUNTER — Other Ambulatory Visit: Payer: Self-pay

## 2020-12-25 VITALS — BP 118/82 | HR 78 | Ht 67.0 in | Wt 153.2 lb

## 2020-12-25 DIAGNOSIS — E1165 Type 2 diabetes mellitus with hyperglycemia: Secondary | ICD-10-CM | POA: Diagnosis not present

## 2020-12-25 DIAGNOSIS — E7800 Pure hypercholesterolemia, unspecified: Secondary | ICD-10-CM | POA: Diagnosis not present

## 2020-12-25 NOTE — Patient Instructions (Signed)
Check blood sugars on waking up 2-3 days a week  Also check blood sugars about 2 hours after meals and do this after different meals by rotation  Recommended blood sugar levels on waking up are 90-130 and about 2 hours after meal is 130-160  Please bring your blood sugar monitor to each visit, thank you  Take Acarbose at dinner also

## 2020-12-25 NOTE — Progress Notes (Signed)
Patient ID: Joe Boyd, male   DOB: [birth year], 44 y.o.   MRN: [id number]           Reason for Appointment: Follow-up visit for Type 2 Diabetes   History of Present Illness:          Diagnosis: Type 2 diabetes mellitus, date of diagnosis: 2012        Past history:  At time of diagnosis he was having symptoms of increased thirst and urination and he was seen by his physician when he started feeling weak and dizzy also. His A1c at diagnosis was 12.8. He thinks he was treated with metformin only and also he started changing his diet along with eliminating sweet soft drinks.  Initially was treated with only 500 mg of metformin ER and in 2014 this was increased to 1000 mg.  He has not had any other medications for diabetes. He was last seen by his endocrinologist in [place] in 07/2014 when his A1c was 6.5 and no changes were made A1c of over 7%  in 7/16 and he was started on Kombiglyze XR In 6/17 with his A1c going up to 7.6 he was given Glyset 25 mg at lunch also in addition to [employer]  Recent history:   A1c is 7.5, in August was 9.5       Oral hypoglycemic drugs :  Acarbose 50 mg at breakfast and 50 mg at lunch , Januvia 100 mg daily, Amaryl 0.5 mg at bedtime  Current management, blood sugar patterns and problems identified:  His blood sugars are not being monitored regularly at home and only 3 readings this month  Also he was told to take acarbose with every meal on his last visit including dinner but he does not do so  Sometimes he will have a sandwich at dinnertime otherwise may have only a soup  Otherwise he usually tries to take acarbose at the start of breakfast and lunch meals  Did have a relatively high reading of 188 after lunch, not clear from what type of meal  Because of his having pneumonia in early December he has not exercised as yet  Fasting glucose was 121  This is controlled better with taking 0.5 mg Amaryl at bedtime  Weight is about the same       Side effects from medications have been: None   Glucose monitoring:  done occasionally,        Glucometer:  Contour      Blood Glucose readings: Has only 3 readings ranging from 110-188 done around midday  PREVIOUS range 88-173, mostly checking in the morning and afternoon   Self-care:  Meals:  1-2 meals per day.  Tries to get some protein with his lunch, sometimes yogurt, sometimes has chicken.    Usually avoiding snacks                  Dietician visit, most recent: 7/16                Weight history:   Wt Readings from Last 3 Encounters:  12/25/20 153 lb 3.2 oz (69.5 kg)  12/10/20 155 lb 8 oz (70.5 kg)  10/16/20 152 lb (68.9 kg)    Glycemic control:   Lab Results  Component Value Date   HGBA1C 7.5 (H) 12/17/2020   HGBA1C 9.5 (H) 08/19/2020   HGBA1C 7.1 (H) 04/15/2020   Lab Results  Component Value Date   MICROALBUR 0.8 04/15/2020   LDLCALC 44 08/19/2020   CREATININE  1.18 12/17/2020   No visits with results within 1 Week(s) from this visit.  Latest known visit with results is:  Lab on 12/17/2020  Component Date Value Ref Range Status  . PTH 12/17/2020 15  15 - 65 pg/mL Final  . Sodium 12/17/2020 142  135 - 145 mEq/L Final  . Potassium 12/17/2020 4.7  3.5 - 5.1 mEq/L Final  . Chloride 12/17/2020 106  96 - 112 mEq/L Final  . CO2 12/17/2020 33* 19 - 32 mEq/L Final  . Glucose, Bld 12/17/2020 121* 70 - 99 mg/dL Final  . BUN 12/17/2020 13  6 - 23 mg/dL Final  . Creatinine, Ser 12/17/2020 1.18  0.40 - 1.50 mg/dL Final  . GFR 12/17/2020 73.96  >60.00 mL/min Final   Calculated using the CKD-EPI Creatinine Equation (2021)  . Calcium 12/17/2020 10.1  8.4 - 10.5 mg/dL Final  . Hgb A1c MFr Bld 12/17/2020 7.5* 4.6 - 6.5 % Final   Glycemic Control Guidelines for People with Diabetes:Non Diabetic:  <6%Goal of Therapy: <7%Additional Action Suggested:  >8%       Allergies as of 12/25/2020      Reactions   Augmentin [amoxicillin-pot Clavulanate] Itching       Medication List       Accurate as of December 25, 2020 10:52 AM. If you have any questions, ask your nurse or doctor.        STOP taking these medications   benzonatate 100 MG capsule Commonly known as: TESSALON Stopped by: Elayne Snare, MD     TAKE these medications   acarbose 50 MG tablet Commonly known as: PRECOSE Take 2 tablet by mouth daily at lunch.  May also take 1 tab at breakfast and dinner if eating   albuterol 108 (90 Base) MCG/ACT inhaler Commonly known as: VENTOLIN HFA Inhale 2 puffs into the lungs every 6 (six) hours as needed for wheezing or shortness of breath.   atorvastatin 10 MG tablet Commonly known as: LIPITOR Take 1 tablet (10 mg total) by mouth daily.   glimepiride 1 MG tablet Commonly known as: Amaryl Take 1 tablet (1 mg total) by mouth at bedtime.   Januvia 100 MG tablet Generic drug: sitaGLIPtin Take 1 tablet by mouth daily   multivitamin with minerals Tabs tablet Take 1 tablet by mouth daily.   nitroGLYCERIN 0.2 mg/hr patch Commonly known as: NITRODUR - Dosed in mg/24 hr Apply 1/4 patch daily to tendon for tendonitis.   OneTouch Delica Lancets 38T Misc Use to check blood sugars daily-Dx code E11.65   OneTouch Verio Sync System w/Device Kit Use to check blood sugars daily-Dx code E11.65   OneTouch Verio test strip Generic drug: glucose blood Use to check blood sugars daily-Dx code E11.65       Allergies:  Allergies  Allergen Reactions  . Augmentin [Amoxicillin-Pot Clavulanate] Itching    Past Medical History:  Diagnosis Date  . Diabetes mellitus without complication ([place])   . Tuberculosis    Finished Rx in 8/15    No past surgical history on file.  Family History  Problem Relation Age of Onset  . Diabetes Mother   . Hypertension Mother   . Thyroid disease Mother   . Heart disease Mother   . Diabetes Father   . Hypertension Father   . Heart disease Father   . Breast cancer Sister        73s  . Heart disease  Paternal Uncle   . Cancer Neg Hx   . Colon cancer  Neg Hx     Social History:  reports that he has never smoked. He has never used smokeless tobacco. He reports current alcohol use. He reports that he does not use drugs.    Review of Systems        Lipids:  LDL and triglycerides have been variable Started on atorvastatin in 4/21 because of high LDL and high risk factors LDL is controlled although HDL low       Lab Results  Component Value Date   CHOL 101 08/19/2020   CHOL 168 04/15/2020   CHOL 110 01/05/2019   Lab Results  Component Value Date   HDL 37.30 (L) 08/19/2020   HDL 49.80 04/15/2020   HDL 42.20 01/05/2019   Lab Results  Component Value Date   LDLCALC 44 08/19/2020   LDLCALC 108 (H) 04/15/2020   LDLCALC 52 01/05/2019   Lab Results  Component Value Date   TRIG 100.0 08/19/2020   TRIG 54.0 04/15/2020   TRIG 77.0 01/05/2019   Lab Results  Component Value Date   CHOLHDL 3 08/19/2020   CHOLHDL 3 04/15/2020   CHOLHDL 3 01/05/2019   No results found for: LDLDIRECT                 Thyroid: Has History of a small goiter with normal TSH   Lab Results  Component Value Date   TSH 0.98 10/04/2019   HYPERKALEMIA:  His potassium is previously high at times without obvious cause   Lab Results  Component Value Date   K 4.7 12/17/2020   Abnormal liver function: Was improved with stopping Voltaren previously taken for his shoulder pain Has not followed up with PCP  Lab Results  Component Value Date   ALT 64 (H) 09/23/2020   HYPERCALCEMIA: He appears to have had high normal or high calcium levels, no history of kidney stones or renal dysfunction  He was told to stop his calcium supplement when calcium was high in 10/21  Lab Results  Component Value Date   CALCIUM 10.1 12/17/2020   CALCIUM 9.1 11/29/2020   CALCIUM 10.8 (H) 10/14/2020   CALCIUM 10.3 08/19/2020   CALCIUM 10.0 04/15/2020   CALCIUM 9.7 01/10/2020     LABS:  No visits with  results within 1 Week(s) from this visit.  Latest known visit with results is:  Lab on 12/17/2020  Component Date Value Ref Range Status  . PTH 12/17/2020 15  15 - 65 pg/mL Final  . Sodium 12/17/2020 142  135 - 145 mEq/L Final  . Potassium 12/17/2020 4.7  3.5 - 5.1 mEq/L Final  . Chloride 12/17/2020 106  96 - 112 mEq/L Final  . CO2 12/17/2020 33* 19 - 32 mEq/L Final  . Glucose, Bld 12/17/2020 121* 70 - 99 mg/dL Final  . BUN 12/17/2020 13  6 - 23 mg/dL Final  . Creatinine, Ser 12/17/2020 1.18  0.40 - 1.50 mg/dL Final  . GFR 12/17/2020 73.96  >60.00 mL/min Final   Calculated using the CKD-EPI Creatinine Equation (2021)  . Calcium 12/17/2020 10.1  8.4 - 10.5 mg/dL Final  . Hgb A1c MFr Bld 12/17/2020 7.5* 4.6 - 6.5 % Final   Glycemic Control Guidelines for People with Diabetes:Non Diabetic:  <6%Goal of Therapy: <7%Additional Action Suggested:  >8%     Physical Examination:  BP 118/82   Pulse 78   Ht _0  (1.702 m)   Wt 153 lb 3.2 oz (69.5 kg)   SpO2 96%   BMI 23.99 kg/m  No ankle edema  Diabetic Foot Exam - Simple   Simple Foot Form Diabetic Foot exam was performed with the following findings: Yes   Visual Inspection No deformities, no ulcerations, no other skin breakdown bilaterally: Yes See comments: Yes Sensation Testing Intact to touch and monofilament testing bilaterally: Yes Pulse Check Posterior Tibialis and Dorsalis pulse intact bilaterally: Yes Comments Mild callus formation on the base of the first toes bilaterally       ASSESSMENT:  Diabetes type 2 , nonobese See history of present illness for detailed discussion of current diabetes management, blood sugar patterns and problems identified  His A1c 7.5  Although overall blood sugars are improving with taking his medication regularly his A1c could be better As discussed above he is not taking acarbose in the evening and likely has high readings after dinner at times Also not clear what kind of foods make  his blood sugars go up after lunch Overall glucose monitoring is inadequate Fasting glucose 121 with taking Amaryl at bedtime  Hypercalcemia: This was transient and with the calcium now being normal he will avoid any dietary supplements PTH only 15 ruling out hyperparathyroidism    PLAN:   He will need to take his acarbose at every meal including dinnertime unless he is only eating and on starchy snack or soup If his blood sugars are over 160 consistently after dinner he will take the Amaryl before eating instead of at bedtime Also he will need to take 2 tablets at lunch time if eating a larger amount of carbohydrate or dessert Continue Januvia but may consider Rybelsus if blood sugars are not consistently controlled or if he has tendency to weight gain Restart walking on treadmill for regular exercise  Will recheck liver functions on the next visit, he needs to discuss with his PCP if they are consistently abnormal  Encouraged him to schedule his Covid booster as soon as possible  Follow-up in 3 months    Elayne Snare 12/25/2020, 10:52 AM   Note: This office note was prepared with Dragon voice recognition system technology. Any transcriptional errors that result from this process are unintentional.

## 2020-12-30 ENCOUNTER — Other Ambulatory Visit: Payer: Self-pay | Admitting: *Deleted

## 2020-12-30 MED ORDER — SITAGLIPTIN PHOSPHATE 100 MG PO TABS
100.0000 mg | ORAL_TABLET | Freq: Every day | ORAL | 1 refills | Status: DC
Start: 2020-12-30 — End: 2021-02-08

## 2020-12-31 ENCOUNTER — Other Ambulatory Visit: Payer: Self-pay | Admitting: *Deleted

## 2020-12-31 ENCOUNTER — Telehealth: Payer: Self-pay | Admitting: Endocrinology

## 2020-12-31 MED ORDER — GLIMEPIRIDE 1 MG PO TABS
1.0000 mg | ORAL_TABLET | Freq: Every day | ORAL | 1 refills | Status: DC
Start: 2020-12-31 — End: 2021-12-18

## 2020-12-31 NOTE — Telephone Encounter (Signed)
Rx sent 

## 2020-12-31 NOTE — Telephone Encounter (Signed)
[facility] is the preferred pharmacy for patient going forward.  Patient is requesting a refill for Glimepiride 1 mg table - 90 day supply to be sent to pharmacy above

## 2021-01-07 LAB — HM DIABETES EYE EXAM

## 2021-01-11 ENCOUNTER — Encounter: Payer: Self-pay | Admitting: Physician Assistant

## 2021-02-07 ENCOUNTER — Telehealth: Payer: Self-pay | Admitting: Endocrinology

## 2021-02-07 NOTE — Telephone Encounter (Signed)
Patient called requested to have 90 day refills for the medications listed below -   Pharmacy is [facility] ([facility] - [place], [place]   Sitagliptin 100 MG tablet Acarbose 50 MG tablet Atorcastatin 10 MG tablet

## 2021-02-08 ENCOUNTER — Other Ambulatory Visit: Payer: Self-pay | Admitting: *Deleted

## 2021-02-08 DIAGNOSIS — E1165 Type 2 diabetes mellitus with hyperglycemia: Secondary | ICD-10-CM

## 2021-02-08 MED ORDER — ACARBOSE 50 MG PO TABS: ORAL_TABLET | ORAL | 1 refills | Status: DC

## 2021-02-08 MED ORDER — SITAGLIPTIN PHOSPHATE 100 MG PO TABS
100.0000 mg | ORAL_TABLET | Freq: Every day | ORAL | 1 refills | Status: DC
Start: 2021-02-08 — End: 2021-08-18

## 2021-02-08 MED ORDER — ATORVASTATIN CALCIUM 10 MG PO TABS
10.0000 mg | ORAL_TABLET | Freq: Every day | ORAL | 3 refills | Status: DC
Start: 2021-02-08 — End: 2022-03-04

## 2021-02-08 NOTE — Telephone Encounter (Signed)
Refilled medication sent to the [facility]

## 2021-03-24 ENCOUNTER — Other Ambulatory Visit: Payer: 59

## 2021-03-27 ENCOUNTER — Ambulatory Visit: Payer: 59 | Admitting: Endocrinology

## 2021-04-08 ENCOUNTER — Other Ambulatory Visit: Payer: Self-pay

## 2021-04-08 ENCOUNTER — Other Ambulatory Visit (INDEPENDENT_AMBULATORY_CARE_PROVIDER_SITE_OTHER): Payer: 59

## 2021-04-08 DIAGNOSIS — E7800 Pure hypercholesterolemia, unspecified: Secondary | ICD-10-CM | POA: Diagnosis not present

## 2021-04-08 DIAGNOSIS — E1165 Type 2 diabetes mellitus with hyperglycemia: Secondary | ICD-10-CM | POA: Diagnosis not present

## 2021-04-08 LAB — COMPREHENSIVE METABOLIC PANEL
ALT: 48 U/L (ref 0–53)
AST: 36 U/L (ref 0–37)
Albumin: 4.4 g/dL (ref 3.5–5.2)
Alkaline Phosphatase: 83 U/L (ref 39–117)
BUN: 14 mg/dL (ref 6–23)
CO2: 30 mEq/L (ref 19–32)
Calcium: 10.2 mg/dL (ref 8.4–10.5)
Chloride: 103 mEq/L (ref 96–112)
Creatinine, Ser: 1.29 mg/dL (ref 0.40–1.50)
GFR: 66.32 mL/min (ref >60.00)
Glucose, Bld: 99 mg/dL (ref 70–99)
Potassium: 4.7 mEq/L (ref 3.5–5.1)
Sodium: 140 mEq/L (ref 135–145)
Total Bilirubin: 0.9 mg/dL (ref 0.2–1.2)
Total Protein: 8 g/dL (ref 6.0–8.3)

## 2021-04-08 LAB — LIPID PANEL
Cholesterol: 90 mg/dL (ref 0–200)
HDL: 37.8 mg/dL — ABNORMAL LOW (ref >39.00)
LDL Cholesterol: 37 mg/dL (ref 0–99)
NonHDL: 52.1
Total CHOL/HDL Ratio: 2
Triglycerides: 78 mg/dL (ref 0.0–149.0)
VLDL: 15.6 mg/dL (ref 0.0–40.0)

## 2021-04-08 LAB — HEMOGLOBIN A1C: Hgb A1c MFr Bld: 7.1 % — ABNORMAL HIGH (ref 4.6–6.5)

## 2021-04-08 LAB — MICROALBUMIN / CREATININE URINE RATIO
Creatinine,U: 185.8 mg/dL
Microalb Creat Ratio: 0.5 mg/g (ref 0.0–30.0)
Microalb, Ur: 0.9 mg/dL (ref 0.0–1.9)

## 2021-04-15 ENCOUNTER — Encounter: Payer: Self-pay | Admitting: Endocrinology

## 2021-04-15 ENCOUNTER — Other Ambulatory Visit: Payer: Self-pay

## 2021-04-15 ENCOUNTER — Ambulatory Visit (INDEPENDENT_AMBULATORY_CARE_PROVIDER_SITE_OTHER): Payer: 59 | Admitting: Endocrinology

## 2021-04-15 VITALS — BP 124/82 | HR 67 | Ht 67.0 in | Wt 155.8 lb

## 2021-04-15 DIAGNOSIS — E119 Type 2 diabetes mellitus without complications: Secondary | ICD-10-CM

## 2021-04-15 DIAGNOSIS — Z8639 Personal history of other endocrine, nutritional and metabolic disease: Secondary | ICD-10-CM

## 2021-04-15 DIAGNOSIS — E785 Hyperlipidemia, unspecified: Secondary | ICD-10-CM | POA: Diagnosis not present

## 2021-04-15 NOTE — Progress Notes (Signed)
Patient ID: Joe Boyd, male   DOB: 08-24-1976, 45 y.o.   MRN: [id number]           Reason for Appointment: Follow-up visit for Type 2 Diabetes   History of Present Illness:          Diagnosis: Type 2 diabetes mellitus, date of diagnosis: 2012        Past history:  At time of diagnosis he was having symptoms of increased thirst and urination and he was seen by his physician when he started feeling weak and dizzy also. His A1c at diagnosis was 12.8. He thinks he was treated with metformin only and also he started changing his diet along with eliminating sweet soft drinks.  Initially was treated with only 500 mg of metformin ER and in 2014 this was increased to 1000 mg.  He has not had any other medications for diabetes. He was last seen by his endocrinologist in [place] in 07/2014 when his A1c was 6.5 and no changes were made A1c of over 7%  in 7/16 and he was started on Kombiglyze XR In 6/17 with his A1c going up to 7.6 he was given Glyset 25 mg at lunch also in addition to [employer]  Recent history:   A1c is slightly better at 7.1       Oral hypoglycemic drugs :  Acarbose 50 mg at breakfast and 50 mg at lunch , Januvia 100 mg daily, Amaryl 0.5 mg at bedtime  Current management, blood sugar patterns and problems identified:  His blood sugars are not being checked regularly at home and has only 2 readings in the last 30 days  He has however started taking his acarbose as directed every meal  Currently he thinks he is usually eating some nuts at dinnertime and not a sandwich or other meals  Again not clear why his A1c is still over 7% even with fairly good readings on the lab fasting and at home  He has started walking on the treadmill the last 3 weeks and is planning to walk regularly, is interested in some weight loss  Fasting glucose now 99 with taking 0.5 mg Amaryl at bedtime  Weight is about the same      Side effects from medications have been: None   Glucose  monitoring:  done occasionally,        Glucometer:  Contour      Blood Glucose readings: Has only 2 readings at midday, 113 and 154    Self-care:  Meals:  1-2 meals per day.  Tries to get some protein with his lunch, sometimes yogurt, sometimes has chicken.    Usually avoiding snacks                  Dietician visit, most recent: 7/16                Weight history:   Wt Readings from Last 3 Encounters:  04/15/21 155 lb 12.8 oz (70.7 kg)  12/25/20 153 lb 3.2 oz (69.5 kg)  12/10/20 155 lb 8 oz (70.5 kg)    Glycemic control:   Lab Results  Component Value Date   HGBA1C 7.1 (H) 04/08/2021   HGBA1C 7.5 (H) 12/17/2020   HGBA1C 9.5 (H) 08/19/2020   Lab Results  Component Value Date   MICROALBUR 0.9 04/08/2021   LDLCALC 37 04/08/2021   CREATININE 1.29 04/08/2021   No visits with results within 1 Week(s) from this visit.  Latest known visit with results  is:  Lab on 04/08/2021  Component Date Value Ref Range Status  . Cholesterol 04/08/2021 90  0 - 200 mg/dL Final   ATP III Classification       Desirable:  < 200 mg/dL               Borderline High:  200 - 239 mg/dL          High:  > = 240 mg/dL  . Triglycerides 04/08/2021 78.0  0.0 - 149.0 mg/dL Final   Normal:  <150 mg/dLBorderline High:  150 - 199 mg/dL  . HDL 04/08/2021 37.80* >39.00 mg/dL Final  . VLDL 04/08/2021 15.6  0.0 - 40.0 mg/dL Final  . LDL Cholesterol 04/08/2021 37  0 - 99 mg/dL Final  . Total CHOL/HDL Ratio 04/08/2021 2   Final                  Men          Women1/2 Average Risk     3.4          3.3Average Risk          5.0          4.42X Average Risk          9.6          7.13X Average Risk          15.0          11.0                      . NonHDL 04/08/2021 52.10   Final   NOTE:  Non-HDL goal should be 30 mg/dL higher than patient's LDL goal (i.e. LDL goal of < 70 mg/dL, would have non-HDL goal of < 100 mg/dL)  . Microalb, Ur 04/08/2021 0.9  0.0 - 1.9 mg/dL Final  . Creatinine,U 04/08/2021 185.8  mg/dL Final   . Microalb Creat Ratio 04/08/2021 0.5  0.0 - 30.0 mg/g Final  . Sodium 04/08/2021 140  135 - 145 mEq/L Final  . Potassium 04/08/2021 4.7  3.5 - 5.1 mEq/L Final  . Chloride 04/08/2021 103  96 - 112 mEq/L Final  . CO2 04/08/2021 30  19 - 32 mEq/L Final  . Glucose, Bld 04/08/2021 99  70 - 99 mg/dL Final  . BUN 04/08/2021 14  6 - 23 mg/dL Final  . Creatinine, Ser 04/08/2021 1.29  0.40 - 1.50 mg/dL Final  . Total Bilirubin 04/08/2021 0.9  0.2 - 1.2 mg/dL Final  . Alkaline Phosphatase 04/08/2021 83  39 - 117 U/L Final  . AST 04/08/2021 36  0 - 37 U/L Final  . ALT 04/08/2021 48  0 - 53 U/L Final  . Total Protein 04/08/2021 8.0  6.0 - 8.3 g/dL Final  . Albumin 04/08/2021 4.4  3.5 - 5.2 g/dL Final  . GFR 04/08/2021 66.32  >60.00 mL/min Final   Calculated using the CKD-EPI Creatinine Equation (2021)  . Calcium 04/08/2021 10.2  8.4 - 10.5 mg/dL Final  . Hgb A1c MFr Bld 04/08/2021 7.1* 4.6 - 6.5 % Final   Glycemic Control Guidelines for People with Diabetes:Non Diabetic:  <6%Goal of Therapy: <7%Additional Action Suggested:  >8%       Allergies as of 04/15/2021      Reactions   Augmentin [amoxicillin-pot Clavulanate] Itching      Medication List       Accurate as of April 15, 2021  3:36 PM. If you have any questions, ask  your nurse or doctor.        acarbose 50 MG tablet Commonly known as: PRECOSE Take 2 tablet by mouth daily at lunch.  May also take 1 tab at breakfast and dinner if eating   albuterol 108 (90 Base) MCG/ACT inhaler Commonly known as: VENTOLIN HFA Inhale 2 puffs into the lungs every 6 (six) hours as needed for wheezing or shortness of breath.   atorvastatin 10 MG tablet Commonly known as: LIPITOR Take 1 tablet (10 mg total) by mouth daily.   glimepiride 1 MG tablet Commonly known as: Amaryl Take 1 tablet (1 mg total) by mouth at bedtime.   multivitamin with minerals Tabs tablet Take 1 tablet by mouth daily.   nitroGLYCERIN 0.2 mg/hr patch Commonly known as:  NITRODUR - Dosed in mg/24 hr Apply 1/4 patch daily to tendon for tendonitis.   OneTouch Delica Lancets 30Y Misc Use to check blood sugars daily-Dx code E11.65   OneTouch Verio Sync System w/Device Kit Use to check blood sugars daily-Dx code E11.65   OneTouch Verio test strip Generic drug: glucose blood Use to check blood sugars daily-Dx code E11.65   sitaGLIPtin 100 MG tablet Commonly known as: Januvia Take 1 tablet (100 mg total) by mouth daily.       Allergies:  Allergies  Allergen Reactions  . Augmentin [Amoxicillin-Pot Clavulanate] Itching    Past Medical History:  Diagnosis Date  . Diabetes mellitus without complication ([place])   . Tuberculosis    Finished Rx in 8/15    No past surgical history on file.  Family History  Problem Relation Age of Onset  . Diabetes Mother   . Hypertension Mother   . Thyroid disease Mother   . Heart disease Mother   . Diabetes Father   . Hypertension Father   . Heart disease Father   . Breast cancer Sister        22s  . Heart disease Paternal Uncle   . Cancer Neg Hx   . Colon cancer Neg Hx     Social History:  reports that he has never smoked. He has never used smokeless tobacco. He reports current alcohol use. He reports that he does not use drugs.    Review of Systems        Lipids:  LDL and triglycerides have been variable Started on atorvastatin 10 mg daily in 4/21 because of high LDL and cardiovascular risk factors LDL is controlled although HDL stays low       Lab Results  Component Value Date   CHOL 90 04/08/2021   CHOL 101 08/19/2020   CHOL 168 04/15/2020   Lab Results  Component Value Date   HDL 37.80 (L) 04/08/2021   HDL 37.30 (L) 08/19/2020   HDL 49.80 04/15/2020   Lab Results  Component Value Date   LDLCALC 37 04/08/2021   LDLCALC 44 08/19/2020   LDLCALC 108 (H) 04/15/2020   Lab Results  Component Value Date   TRIG 78.0 04/08/2021   TRIG 100.0 08/19/2020   TRIG 54.0 04/15/2020   Lab  Results  Component Value Date   CHOLHDL 2 04/08/2021   CHOLHDL 3 08/19/2020   CHOLHDL 3 04/15/2020   No results found for: LDLDIRECT                 Thyroid: Has History of a small goiter with normal TSH   Lab Results  Component Value Date   TSH 0.98 10/04/2019   HYPERKALEMIA:  His potassium had been  previously high at times without obvious etiology   Lab Results  Component Value Date   K 4.7 04/08/2021   Abnormal liver function: improved with stopping Voltaren previously taken for his shoulder pain   Lab Results  Component Value Date   ALT 48 04/08/2021   HYPERCALCEMIA: He appears to have had high normal or high calcium levels, no history of kidney stones or renal dysfunction  He was told to stop his calcium supplement when calcium was high in 10/21 Calcium again upper normal  Lab Results  Component Value Date   CALCIUM 10.2 04/08/2021   CALCIUM 10.1 12/17/2020   CALCIUM 9.1 11/29/2020   CALCIUM 10.8 (H) 10/14/2020   CALCIUM 10.3 08/19/2020   CALCIUM 10.0 04/15/2020     LABS:  No visits with results within 1 Week(s) from this visit.  Latest known visit with results is:  Lab on 04/08/2021  Component Date Value Ref Range Status  . Cholesterol 04/08/2021 90  0 - 200 mg/dL Final   ATP III Classification       Desirable:  < 200 mg/dL               Borderline High:  200 - 239 mg/dL          High:  > = 240 mg/dL  . Triglycerides 04/08/2021 78.0  0.0 - 149.0 mg/dL Final   Normal:  <150 mg/dLBorderline High:  150 - 199 mg/dL  . HDL 04/08/2021 37.80* >39.00 mg/dL Final  . VLDL 04/08/2021 15.6  0.0 - 40.0 mg/dL Final  . LDL Cholesterol 04/08/2021 37  0 - 99 mg/dL Final  . Total CHOL/HDL Ratio 04/08/2021 2   Final                  Men          Women1/2 Average Risk     3.4          3.3Average Risk          5.0          4.42X Average Risk          9.6          7.13X Average Risk          15.0          11.0                      . NonHDL 04/08/2021 52.10   Final    NOTE:  Non-HDL goal should be 30 mg/dL higher than patient's LDL goal (i.e. LDL goal of < 70 mg/dL, would have non-HDL goal of < 100 mg/dL)  . Microalb, Ur 04/08/2021 0.9  0.0 - 1.9 mg/dL Final  . Creatinine,U 04/08/2021 185.8  mg/dL Final  . Microalb Creat Ratio 04/08/2021 0.5  0.0 - 30.0 mg/g Final  . Sodium 04/08/2021 140  135 - 145 mEq/L Final  . Potassium 04/08/2021 4.7  3.5 - 5.1 mEq/L Final  . Chloride 04/08/2021 103  96 - 112 mEq/L Final  . CO2 04/08/2021 30  19 - 32 mEq/L Final  . Glucose, Bld 04/08/2021 99  70 - 99 mg/dL Final  . BUN 04/08/2021 14  6 - 23 mg/dL Final  . Creatinine, Ser 04/08/2021 1.29  0.40 - 1.50 mg/dL Final  . Total Bilirubin 04/08/2021 0.9  0.2 - 1.2 mg/dL Final  . Alkaline Phosphatase 04/08/2021 83  39 - 117 U/L Final  . AST 04/08/2021 36  0 -  37 U/L Final  . ALT 04/08/2021 48  0 - 53 U/L Final  . Total Protein 04/08/2021 8.0  6.0 - 8.3 g/dL Final  . Albumin 04/08/2021 4.4  3.5 - 5.2 g/dL Final  . GFR 04/08/2021 66.32  >60.00 mL/min Final   Calculated using the CKD-EPI Creatinine Equation (2021)  . Calcium 04/08/2021 10.2  8.4 - 10.5 mg/dL Final  . Hgb A1c MFr Bld 04/08/2021 7.1* 4.6 - 6.5 % Final   Glycemic Control Guidelines for People with Diabetes:Non Diabetic:  <6%Goal of Therapy: <7%Additional Action Suggested:  >8%     Physical Examination:  BP 124/82   Pulse 67   Ht '5\' 7"'  (1.702 m)   Wt 155 lb 12.8 oz (70.7 kg)   SpO2 98%   BMI 24.40 kg/m       ASSESSMENT:  Diabetes type 2 , nonobese See history of present illness for detailed discussion of current diabetes management, blood sugar patterns and problems identified  His A1c 7.1 compared to 7.5  Blood sugars are generally well controlled and likely improved from taking acarbose consistently with every meal Also is eating less carbohydrates overall and only a snack in the evening at dinnertime Recently walking for exercise  No diabetic complication, urine microalbumin  normal  Hypercalcemia: Not present currently and calcium is high normal as before, does not appear to be related to hyperparathyroidism  History of dyslipidemia: He will continue on atorvastatin 1 mg for cardiovascular risk reduction even with his LDL only 37  History of abnormal liver functions, resolved and likely to be from taking Motrin previously  PLAN:   No change in medication More consistent monitoring especially after meals  Follow-up in 4 months    Avian Greenawalt 04/15/2021, 3:36 PM   Note: This office note was prepared with Dragon voice recognition system technology. Any transcriptional errors that result from this process are unintentional.

## 2021-04-15 NOTE — Patient Instructions (Signed)
Check blood sugars on waking up 2-3 days a week  Also check blood sugars about 2 hours after meals and do this after different meals by rotation  Recommended blood sugar levels on waking up are 90-130 and about 2 hours after meal is 130-160  Please bring your blood sugar monitor to each visit, thank you   

## 2021-06-13 ENCOUNTER — Other Ambulatory Visit: Payer: Self-pay

## 2021-06-13 ENCOUNTER — Ambulatory Visit (INDEPENDENT_AMBULATORY_CARE_PROVIDER_SITE_OTHER): Payer: 59 | Admitting: Family Medicine

## 2021-06-13 VITALS — BP 108/70 | HR 82 | Temp 98.2°F | Resp 15 | Ht 67.0 in | Wt 152.6 lb

## 2021-06-13 DIAGNOSIS — E1165 Type 2 diabetes mellitus with hyperglycemia: Secondary | ICD-10-CM

## 2021-06-13 DIAGNOSIS — R300 Dysuria: Secondary | ICD-10-CM | POA: Diagnosis not present

## 2021-06-13 DIAGNOSIS — N481 Balanitis: Secondary | ICD-10-CM

## 2021-06-13 DIAGNOSIS — N451 Epididymitis: Secondary | ICD-10-CM

## 2021-06-13 DIAGNOSIS — R112 Nausea with vomiting, unspecified: Secondary | ICD-10-CM | POA: Diagnosis not present

## 2021-06-13 DIAGNOSIS — N410 Acute prostatitis: Secondary | ICD-10-CM

## 2021-06-13 DIAGNOSIS — R1031 Right lower quadrant pain: Secondary | ICD-10-CM

## 2021-06-13 LAB — CBC
HCT: 40.3 % (ref 39.0–52.0)
Hemoglobin: 12.8 g/dL — ABNORMAL LOW (ref 13.0–17.0)
MCHC: 31.8 g/dL (ref 30.0–36.0)
MCV: 66.4 fl — ABNORMAL LOW (ref 78.0–100.0)
Platelets: 191 10*3/uL (ref 150.0–400.0)
RBC: 6.07 Mil/uL — ABNORMAL HIGH (ref 4.22–5.81)
RDW: 15.5 % (ref 11.5–15.5)
WBC: 8.7 10*3/uL (ref 4.0–10.5)

## 2021-06-13 LAB — POCT URINALYSIS DIPSTICK
Bilirubin, UA: NEGATIVE
Blood, UA: POSITIVE
Glucose, UA: NEGATIVE
Ketones, UA: 5
Nitrite, UA: NEGATIVE
Protein, UA: NEGATIVE
Spec Grav, UA: 1.015 (ref 1.010–1.025)
Urobilinogen, UA: 0.2 E.U./dL
pH, UA: 5 (ref 5.0–8.0)

## 2021-06-13 LAB — POCT GLUCOSE (DEVICE FOR HOME USE): Glucose Fasting, POC: 100 mg/dL — AB (ref 70–99)

## 2021-06-13 LAB — COMPREHENSIVE METABOLIC PANEL
ALT: 41 U/L (ref 0–53)
AST: 27 U/L (ref 0–37)
Albumin: 4.7 g/dL (ref 3.5–5.2)
Alkaline Phosphatase: 85 U/L (ref 39–117)
BUN: 15 mg/dL (ref 6–23)
CO2: 30 mEq/L (ref 19–32)
Calcium: 10.1 mg/dL (ref 8.4–10.5)
Chloride: 101 mEq/L (ref 96–112)
Creatinine, Ser: 1.25 mg/dL (ref 0.40–1.50)
GFR: 68.78 mL/min (ref >60.00)
Glucose, Bld: 80 mg/dL (ref 70–99)
Potassium: 4.3 mEq/L (ref 3.5–5.1)
Sodium: 140 mEq/L (ref 135–145)
Total Bilirubin: 0.7 mg/dL (ref 0.2–1.2)
Total Protein: 7.5 g/dL (ref 6.0–8.3)

## 2021-06-13 LAB — URINALYSIS, MICROSCOPIC ONLY

## 2021-06-13 LAB — PSA: PSA: 0.33 ng/mL (ref 0.10–4.00)

## 2021-06-13 MED ORDER — CIPROFLOXACIN HCL 500 MG PO TABS: 500.0000 mg | ORAL_TABLET | Freq: Two times a day (BID) | ORAL | 0 refills | Status: AC

## 2021-06-13 MED ORDER — CLOTRIMAZOLE 1 % EX CREA: 1.0000 "application " | TOPICAL_CREAM | Freq: Two times a day (BID) | CUTANEOUS | 0 refills | Status: DC

## 2021-06-13 NOTE — Progress Notes (Signed)
Subjective:  Patient ID: Joe Boyd, male    DOB: Jun 08, 1976  Age: 45 y.o. MRN: [id number]  CC:  Chief Complaint  Patient presents with   Urinary Frequency    Pt reports Rt side and Rt lower back pain, lower abdominal pain started last night, reports scaling at the head of penis 1 week ago but has since gone. Discomfort with urination, denies blood in urine    HPI Joe Boyd presents for  Dysuria New job - working from home past 2 weeks at [employer].  Yesterday evening - pain in R lower back with sitting, moved to R flank and lower abdomen. Increased water intake. Urinary frequency yesterday (prior to increased fluid intake). Some nausea, vomited once. No fever. Some nausea today, no vomiting. Less pain than yesterday, but still sore in back and lower abdomen - worse with sitting. More on R side.  Sore in lower abdomen - feels like need to urinate.  No diarrhea. Last BM this am normal. No diverticulosis on colonoscopy in 2020.  No abdominal surgeries.  No hx of kidney stones. No gross hematuria or dysuria.  Noticed white patch/dry skin on head of penis 2 weeks ago - resolved except minimal reddened area now. No pain. No penile discharge. No new sexual partners - recently engaged.   Hx of DM2, on januvia, precose, amaryl.  Last checked glucose a week ago - 130. Endocrine Dr. Dwyane Dee.    History Patient Active Problem List   Diagnosis Date Noted   Nuclear sclerotic cataract of both eyes 10/15/2020   Glaucoma suspect of both eyes 10/15/2020   Thumb pain, left 11/16/2018   Lumbar pain with radiation down right leg 11/16/2018   Refusal of statin medication by patient 10/20/2018   Type 2 diabetes mellitus with hyperglycemia ([place]) 02/23/2015   Past Medical History:  Diagnosis Date   Diabetes mellitus without complication ([place])    Tuberculosis    Finished Rx in 8/15   No past surgical history on file. Allergies  Allergen Reactions   Augmentin [Amoxicillin-Pot  Clavulanate] Itching   Diclofenac     Abnormal liver functions   Prior to Admission medications   Medication Sig Start Date End Date Taking? Authorizing Provider  acarbose (PRECOSE) 50 MG tablet Take 2 tablet by mouth daily at lunch.  May also take 1 tab at breakfast and dinner if eating 02/08/21  Yes Elayne Snare, MD  atorvastatin (LIPITOR) 10 MG tablet Take 1 tablet (10 mg total) by mouth daily. 02/08/21  Yes Elayne Snare, MD  Blood Glucose Monitoring Suppl ([place]) w/Device KIT Use to check blood sugars daily-Dx code E11.65 08/29/20  Yes Elayne Snare, MD  glimepiride (AMARYL) 1 MG tablet Take 1 tablet (1 mg total) by mouth at bedtime. 12/31/20  Yes Elayne Snare, MD  glucose blood (ONETOUCH VERIO) test strip Use to check blood sugars daily-Dx code E11.65 08/29/20  Yes Elayne Snare, MD  Multiple Vitamin (MULTIVITAMIN WITH MINERALS) TABS tablet Take 1 tablet by mouth daily.    Yes [provider]  OneTouch Delica Lancets 53G MISC Use to check blood sugars daily-Dx code E11.65 08/29/20  Yes Elayne Snare, MD  sitaGLIPtin (JANUVIA) 100 MG tablet Take 1 tablet (100 mg total) by mouth daily. 02/08/21  Yes Elayne Snare, MD  albuterol (VENTOLIN HFA) 108 (90 Base) MCG/ACT inhaler Inhale 2 puffs into the lungs every 6 (six) hours as needed for wheezing or shortness of breath. Patient not taking: Reported on 06/13/2021  [provider]  nitroGLYCERIN (NITRODUR - DOSED IN MG/24 HR) 0.2 mg/hr patch Apply 1/4 patch daily to tendon for tendonitis. Patient not taking: Reported on 06/13/2021 09/23/20   Gregor Hams, MD   Social History   Socioeconomic History   Marital status: Divorced    Spouse name: Not on file   Number of children: 1   Years of education: 16   Highest education level: Not on file  Occupational History   Occupation: Emergency planning/management officer: [employer]  Tobacco Use   Smoking status: Never   Smokeless tobacco: Never  Vaping Use   Vaping Use: Never used   Substance and Sexual Activity   Alcohol use: Yes    Comment: occasionally   Drug use: No   Sexual activity: Not on file  Other Topics Concern   Not on file  Social History Narrative   Born and raised in [place] until the age of 89. Parents live in [place]. Father is a Film/video editor. Divorced. Son is 40. 04/19/2017    Social Determinants of Health   Financial Resource Strain: Not on file  Food Insecurity: Not on file  Transportation Needs: Not on file  Physical Activity: Not on file  Stress: Not on file  Social Connections: Not on file  Intimate Partner Violence: Not on file    Review of Systems Per HPI.   Objective:   Vitals:   06/13/21 1207  BP: 108/70  Pulse: 82  Resp: 15  Temp: 98.2 F (36.8 C)  TempSrc: Temporal  SpO2: 97%  Weight: 152 lb 9.6 oz (69.2 kg)  Height: _0  (1.702 m)     Physical Exam Constitutional:      General: He is not in acute distress.    Appearance: Normal appearance. He is well-developed.  HENT:     Head: Normocephalic and atraumatic.  Cardiovascular:     Rate and Rhythm: Normal rate.  Pulmonary:     Effort: Pulmonary effort is normal.  Abdominal:     Tenderness: There is no right CVA tenderness or left CVA tenderness.     Hernia: There is no hernia in the left inguinal area or right inguinal area.     Comments: Nondistended, flat.  Negative Murphy, right upper quadrant nontender.  Slight discomfort periumbilical to the right lower quadrant, primarily periumbilical and suprapubic.  No rebound or guarding.  Negative heel jar, negative psoas testing.  No CVA tenderness, slight discomfort, reproducible over his right lower paraspinals of the back.  Genitourinary:    Testes:        Right: Mass not present.        Left: Mass or tenderness not present.     Epididymis:     Right: Tenderness present.     Left: No tenderness.     Prostate: Tender (tender to palpation with exam but also noted pain with palpation of  prostate, unable to  determine enlargement.  Guarded exam.).    Lymphadenopathy:     Lower Body: No right inguinal adenopathy. No left inguinal adenopathy.  Neurological:     Mental Status: He is alert and oriented to person, place, and time.  Psychiatric:        Mood and Affect: Mood normal.     Results for orders placed or performed in visit on 06/13/21  POCT Urinalysis Dipstick  Result Value Ref Range   Color, UA pale yellow    Clarity, UA clear    Glucose, UA Negative  Negative   Bilirubin, UA neg    Ketones, UA 5    Spec Grav, UA 1.015 1.010 - 1.025   Blood, UA positive    pH, UA 5.0 5.0 - 8.0   Protein, UA Negative Negative   Urobilinogen, UA 0.2 0.2 or 1.0 E.U./dL   Nitrite, UA negative    Leukocytes, UA Small (1+) (A) Negative   Appearance     Odor    POCT Glucose (Device for Home Use)  Result Value Ref Range   Glucose Fasting, POC 100 (A) 70 - 99 mg/dL   POC Glucose      Assessment & Plan:  Joe Boyd is a 45 y.o. male . Dysuria - Plan: POCT Urinalysis Dipstick, Urine Culture  Type 2 diabetes mellitus with hyperglycemia, without long-term current use of insulin (HCC) - Plan: POCT Glucose (Device for Home Use)  RLQ abdominal pain - Plan: CBC, Comprehensive metabolic panel  Non-intractable vomiting with nausea, unspecified vomiting type  Balanitis - Plan: clotrimazole (LOTRIMIN) 1 % cream  Right epididymitis - Plan: CBC, ciprofloxacin (CIPRO) 500 MG tablet  Acute prostatitis - Plan: CBC, Urine Microscopic, PSA, ciprofloxacin (CIPRO) 500 MG tablet  Appears to have a few coexisting conditions.  Likely balanitis with some improvement, but with start of antibiotics today will cover with clotrimazole as potentially could cause worsening.  RTC precautions.  Lower abdominal pain, flank pain with hematuria noted on urinalysis, few WBC.  Differential includes nephrolithiasis without previous history, urinary tract infection, less likely pyelonephritis as afebrile, no CVA  tenderness.  Tenderness over the epididymis as well as prostate, likely infection.   -Start Cipro 500 mg twice daily x10 days, potential side effects and risk including tendinopathy/rupture risks discussed.  Hold glimepiride due to potential hypoglycemia with Cipro.  Home monitoring of glucose with half dosing of glimepiride if needed.  -Check PSA, urine culture, urine micro, CBC.  Consider CT imaging/ER precautions if not improving in the next 48 hours or worsening sooner.  Understanding expressed.  Meds ordered this encounter  Medications   ciprofloxacin (CIPRO) 500 MG tablet    Sig: Take 1 tablet (500 mg total) by mouth 2 (two) times daily for 10 days.    Dispense:  20 tablet    Refill:  0   clotrimazole (LOTRIMIN) 1 % cream    Sig: Apply 1 application topically 2 (two) times daily.    Dispense:  30 g    Refill:  0   Patient Instructions  Start Cipro for possible prostate and epididymis infection.  See information below.  Clotrimazole antifungal cream 1-2 times per day to the top of the penile rash.  If that is not continue to improve, follow-up for recheck.  Stop your glimepiride while taking Cipro as that may cause low blood sugars.  Monitor your blood sugars and if over 200, may need to start back at low dose glimepiride temporarily.  I will check some blood work as well as urine test as we discussed today.  If abdominal pain is worsening or not starting to improve in the next 24-48 hours I do recommend evaluation in the emergency room as you may need imaging such as CT scan.  Let me know if there are questions.   Return to the clinic or go to the nearest emergency room if any of your symptoms worsen or new symptoms occur.  [employer] Clinical Advisor 2018 (pp. [phone]). [place], [place]: Elsevier.">  Balanitis  Balanitis is swelling and irritation of the head of the  penis (glans penis). Balanitis occurs most often among males who have not had their foreskin removed (uncircumcised). In  uncircumcised males, the condition may also cause inflammation of theskin around the foreskin. Balanitis sometimes causes scarring of the penis or foreskin, which can require surgery. This condition may develop because of an infection or another medicalcondition. Untreated balanitis can increase the risk of penile cancer. What are the causes? Common causes of this condition include: Poor personal hygiene, especially in uncircumcised males. Not cleaning the glans penis and foreskin well can result in a buildup of bacteria, viruses, and yeast, which can lead to infection and inflammation. Irritation and lack of air flow due to fluid (smegma) that can build up on the glans penis. Other causes include: Chemical irritation from products such as soaps or shower gels, especially those that have fragrance. Chemical irritation can also be caused by condoms, personal lubricants, petroleum jelly, spermicides, or fabric softeners. Skin conditions, such as eczema, dermatitis, and psoriasis. Allergies to medicines, such as tetracycline and sulfa drugs. What increases the risk? The following factors may make you more likely to develop this condition: Being an uncircumcised male. Having diabetes. Having other medical conditions, including liver cirrhosis, congestive heart failure, or kidney disease. Having infections, such as candidiasis, HPV (human papillomavirus), herpes simplex, gonorrhea, or syphilis. Having a tight foreskin that is difficult to pull back (retract) past the glans penis. Being severely obese. What are the signs or symptoms? Symptoms of this condition include: Discharge from under the foreskin, and pain or difficulty retracting the foreskin. A bad smell or itchiness on the penis. Tenderness, redness, and swelling of the glans penis. A rash or sores on the glans penis or foreskin. Inability to get an erection due to pain. Difficulty urinating. Scarring of the penis or foreskin, in some  cases. How is this diagnosed? This condition may be diagnosed based on a physical exam and tests of a swab ofdischarge to check for bacterial or fungal infection. You may also have blood tests to check for: Viruses that can cause balanitis. A high blood sugar (glucose) level. This could be a sign of diabetes, which can increase the risk of balanitis. How is this treated? Treatment for balanitis depends on the cause. Treatment may include: Improving personal hygiene. Your health care provider may recommend sitting in a bath of warm water that is deep enough to cover your hips and buttocks (sitz bath). Taking medicines such as: Creams or ointments to reduce swelling (steroids) or to treat an infection. Antibiotic medicine. Antifungal medicine. Having surgery to remove or cut the foreskin (circumcision). This may be done if you have scarring on the foreskin that makes it difficult to retract. Controlling other medical problems that may be causing your condition or making it worse. Follow these instructions at home: Medicines Take over-the-counter and prescription medicines only as told by your health care provider. If you were prescribed an antibiotic medicine or a cream or ointment, use it as told by your health care provider. Do not stop using your medicine, cream, or ointment even if you start to feel better. General instructions Do not have sex until the condition clears up, or until your health care provider approves. Keep your penis clean and dry. Take sitz baths as recommended by your health care provider. Avoid products that irritate your skin or make symptoms worse, such as soaps and shower gels that have fragrance. Keep all follow-up visits as told by your health care provider. This is important. Contact a health  care provider if: Your symptoms get worse or do not improve with home care. You develop chills or a fever. You have trouble urinating. You cannot retract your  foreskin. Get help right away if: You develop severe pain. You are unable to urinate. Summary Balanitis is inflammation of the head of the penis (glans penis) caused by irritation or infection. This condition is most common among uncircumcised males. Balanitis causes pain, redness, and swelling of the glans penis. Good personal hygiene is important. Treatment may include improving personal hygiene and applying creams or ointments. Contact a health care provider if your symptoms get worse or do not improve with home care. This information is not intended to replace advice given to you by your health care provider. Make sure you discuss any questions you have with your healthcare provider. Document Revised: 10/04/2019 Document Reviewed: 10/04/2019 Elsevier Patient Education  2022 [place]. Prostatitis  Prostatitis is swelling or inflammation of the prostate gland, also called the prostate. This gland is about 1.5 inches wide and 1 inch high, and it is involved in making semen. The prostate is located below a man's bladder, infront of the rectum. There are four types of prostatitis: Chronic prostatitis (CP), also called chronic pelvic pain syndrome (CPPS). This is the most common type of prostatitis. It is associated with increased muscle tone in the area between the hip bones (pelvic area), around the prostate. This type is also known as a pelvic floor disorder. Chronic bacterial prostatitis. This type usually results from an acute bacterial infection in the prostate gland that keeps coming back or has not been treated properly. The symptoms are less severe than those caused by acute bacterial prostatitis, which lasts a shorter time. Asymptomatic inflammatory prostatitis. This type does not have symptoms and does not need treatment. This is diagnosed when tests are done for other disorders of the urinary tract or reproductive tract. Acute bacterial prostatitis. This type starts quickly and  results from an acute bacterial infection in the prostate gland. It is usually associated with a bladder infection, high fever, and chills. This is the least common type of prostatitis. What are the causes? Bacterial prostatitis is caused by an infection from bacteria. Chronic nonbacterial prostatitis may be caused by: Factors related to the nervous system. This system includes thebrain, spinal cord, and nerves. An autoimmune response. This happens when the body's disease-fighting system attacks healthy tissue in the body by mistake. Psychological factors. These have to do with how the mind works. The causes of the other types of prostatitis are usually not known. What are the signs or symptoms? Symptoms of this condition depend on the type of prostatitis you have. Acute bacterial prostatitis Symptoms may include: Pain or burning during urination. Frequent and sudden urges to urinate. Trouble starting to urinate. Fever. Chills. Pain in your muscles or joints, lower back, or lower abdomen. Other types of prostatitis Symptoms may include: Sudden urges to urinate, or urinating often. Trouble starting to urinate. Weak urine stream. Dribbling after urination. Discharge coming from the penis. Pain in the testicles, the penis, or the tip of the penis. Pain in the area in front of the rectum and below the scrotum (perineum). Pain when ejaculating. How is this diagnosed? This condition may be diagnosed based on: A physical and medical exam. A digital rectal exam. For this, the health care provider may use a finger to feel the prostate. A urine test to check for bacteria. A semen sample or blood tests. Ultrasound. Urodynamic tests to  check how your body handles urine. Cystoscopy to look inside your bladder or inside the part of your body that drains urine from the bladder (urethra). How is this treated? Treatment for this condition depends on the type of prostatitis. Treatment may  involve: Medicines to relieve pain or inflammation, or to help relax your muscles. Physical therapy. Heat therapy. Biofeedback. These techniques help you control certain body functions. Relaxation exercises. Antibiotic medicine, if your condition is caused by bacteria. Sitz baths. These warm water baths help to relax your pelvic floor muscles, which helps to relieve pressure on the prostate. Follow these instructions at home: Medicines Take over-the-counter and prescription medicines only as told by your health care provider. If you were prescribed an antibiotic medicine, take it as told by your health care provider. Do not stop using the antibiotic even if you start to feel better. Managing pain and swelling  Take sitz baths as directed by your health care provider. For a sitz bath, sit in warm water that is deep enough to cover your hips and buttocks. If directed, apply heat to the affected area as often as told by your health care provider. Use the heat source that your health care provider recommends, such as a moist heat pack or a heating pad. Place a towel between your skin and the heat source. Leave the heat on for 20-30 minutes. Remove the heat if your skin turns bright red. This is especially important if you are unable to feel pain, heat, or cold. You may have a greater risk of getting burned.  General instructions Do exercises as told by your health care provider, if you were prescribed physical therapy, biofeedback, or relaxation exercises. Keep all follow-up visits as told by your health care provider. This is important. Where to find more information [employer] of Diabetes and Digestive and Kidney Diseases: http://www.bass.com/ Contact a health care provider if: Your symptoms get worse. You have a fever. Get help right away if: You have chills. You feel light-headed or feel like you may faint. You cannot urinate. You have blood or blood clots in your  urine. Summary Prostatitis is swelling or inflammation of the prostate gland. Treatment for this condition depends on the type of prostatitis. Take over-the-counter and prescription medicines only as told by your health care provider. Get help right away of you have chills, feel light-headed, feel like you may faint, cannot urinate, or have blood or blood clots in your urine. This information is not intended to replace advice given to you by your health care provider. Make sure you discuss any questions you have with your healthcare provider. Document Revised: 01/19/2020 Document Reviewed: 01/19/2020 Elsevier Patient Education  2022 [place]. Epididymitis  Epididymitis is swelling (inflammation) or infection of the epididymis. The epididymis is a cord-like structure that is located along the top and back part of the testicle. It collects and storessperm from the testicle. This condition can also cause pain and swelling of the testicle and scrotum. Symptoms usually start suddenly (acute epididymitis). Sometimes epididymitis starts gradually and lasts for a while (chronic epididymitis). This type may be harder to treat. What are the causes? In men ages 36-40, this condition is usually caused by a bacterial infection or a sexually transmitted disease (STD), such as: Gonorrhea. Chlamydia. In men 11 and older who do not have anal sex, this condition is usually caused by bacteria from a blockage or from abnormalities in the urinary system. These can result from: Having a tube placed into  the bladder (urinary catheter). Having an enlarged or inflamed prostate gland. Having recently had urinary tract surgery. Having a problem with a backward flow of urine (retrograde). In men who have a condition that weakens the body's defense system (immune system), such as HIV, this condition can be caused by: Other bacteria, including tuberculosis and syphilis. Viruses. Fungi. Sometimes this condition  occurs without infection. This may happen because oftrauma or repetitive activities such as sports. What increases the risk? You are more likely to develop this condition if you have: Unprotected sex with more than one partner. Anal sex. Recently had surgery. A urinary catheter. Urinary problems. A suppressed immune system. What are the signs or symptoms? This condition usually begins suddenly with chills, fever, and pain behind the scrotum and in the testicle. Other symptoms include: Swelling of the scrotum, testicle, or both. Pain when ejaculating or urinating. Pain in the back or abdomen. Nausea. Itching and discharge from the penis. A frequent need to pass urine. Redness, increased warmth, and tenderness of the scrotum. How is this diagnosed? Your health care provider can diagnose this condition based on your symptoms and medical history. Your health care provider will also do a physical exam to ask about your symptoms and check your scrotum and testicle for swelling, pain, and redness. You may also have other tests, including: Examination of discharge from the penis. Urine tests for infections, such as STDs. Ultrasound test for blood flow and inflammation. Your health care provider may test you for other STDs, including HIV. How is this treated? Treatment for this condition depends on the cause. If your condition is caused by a bacterial infection, oral antibiotic medicine may be prescribed. If the bacterial infection has spread to your blood, you may need to receive IVantibiotics. For both bacterial and nonbacterial epididymitis, you may be treated with: Rest. Elevation of the scrotum. Pain medicines. Anti-inflammatory medicines. Surgery may be needed to treat: Bacterial epididymitis that causes pus to build up in the scrotum (abscess). Chronic epididymitis that has not responded to other treatments. Follow these instructions at home: Medicines Take over-the-counter and  prescription medicines only as told by your health care provider. If you were prescribed an antibiotic medicine, take it as told by your health care provider. Do not stop taking the antibiotic even if your condition improves. Sexual activity If your epididymitis was caused by an STD, avoid sexual activity until your treatment is complete. Inform your sexual partner or partners if you test positive for an STD. They may need to be treated. Do not engage in sexual activity with your partner or partners until their treatment is completed. Managing pain and swelling  If directed, elevate your scrotum and apply ice. Put ice in a plastic bag. Place a small towel or pillow between your legs. Rest your scrotum on the pillow or towel. Place another towel between your skin and the plastic bag. Leave the ice on for 20 minutes, 2-3 times a day. Try taking a sitz bath to help with discomfort. This is a warm water bath that is taken while you are sitting down. The water should only come up to your hips and should cover your buttocks. Do this 3-4 times per day or as told by your health care provider. Keep your scrotum elevated and supported while resting. Ask your health care provider if you should wear a scrotal support, such as a jockstrap. Wear it as told by your health care provider.  General instructions Return to your normal activities as  told by your health care provider. Ask your health care provider what activities are safe for you. Drink enough fluid to keep your urine pale yellow. Keep all follow-up visits as told by your health care provider. This is important. Contact a health care provider if: You have a fever. Your pain medicine is not helping. Your pain is getting worse. Your symptoms do not improve within 3 days. Summary Epididymitis is swelling (inflammation) or infection of the epididymis. This condition can also cause pain and swelling of the testicle and scrotum. Treatment for this  condition depends on the cause. If your condition is caused by a bacterial infection, oral antibiotic medicine may be prescribed. Inform your sexual partner or partners if you test positive for an STD. They may need to be treated. Do not engage in sexual activity with your partner or partners until their treatment is completed. Contact a health care provider if your symptoms do not improve within 3 days. This information is not intended to replace advice given to you by your health care provider. Make sure you discuss any questions you have with your healthcare provider. Document Revised: 10/17/2018 Document Reviewed: 10/18/2018 Elsevier Patient Education  2022 [place]. Abdominal Pain, Adult Pain in the abdomen (abdominal pain) can be caused by many things. Often, abdominal pain is not serious and it gets better with no treatment or by being treated at home. However, sometimesabdominal pain is serious. Your health care provider will ask questions about your medical history and doa physical exam to try to determine the cause of your abdominal pain. Follow these instructions at home: Medicines Take over-the-counter and prescription medicines only as told by your health care provider. Do not take a laxative unless told by your health care provider. General instructions  Watch your condition for any changes. Drink enough fluid to keep your urine pale yellow. Keep all follow-up visits as told by your health care provider. This is important.  Contact a health care provider if: Your abdominal pain changes or gets worse. You are not hungry or you lose weight without trying. You are constipated or have diarrhea for more than 2-3 days. You have pain when you urinate or have a bowel movement. Your abdominal pain wakes you up at night. Your pain gets worse with meals, after eating, or with certain foods. You are vomiting and cannot keep anything down. You have a fever. You have blood in your  urine. Get help right away if: Your pain does not go away as soon as your health care provider told you to expect. You cannot stop vomiting. Your pain is only in areas of the abdomen, such as the right side or the left lower portion of the abdomen. Pain on the right side could be caused by appendicitis. You have bloody or black stools, or stools that look like tar. You have severe pain, cramping, or bloating in your abdomen. You have signs of dehydration, such as: Dark urine, very little urine, or no urine. Cracked lips. Dry mouth. Sunken eyes. Sleepiness. Weakness. You have trouble breathing or chest pain. Summary Often, abdominal pain is not serious and it gets better with no treatment or by being treated at home. However, sometimes abdominal pain is serious. Watch your condition for any changes. Take over-the-counter and prescription medicines only as told by your health care provider. Contact a health care provider if your abdominal pain changes or gets worse. Get help right away if you have severe pain, cramping, or bloating in your  abdomen. This information is not intended to replace advice given to you by your health care provider. Make sure you discuss any questions you have with your healthcare provider. Document Revised: 02/02/2020 Document Reviewed: 04/24/2019 Elsevier Patient Education  2022 [place],   Merri Ray, MD [place], [facility] 06/13/21 1:28 PM

## 2021-06-13 NOTE — Patient Instructions (Addendum)
Start Cipro for possible prostate and epididymis infection.  See information below.  Clotrimazole antifungal cream 1-2 times per day to the top of the penile rash.  If that is not continue to improve, follow-up for recheck.  Stop your glimepiride while taking Cipro as that may cause low blood sugars.  Monitor your blood sugars and if over 200, may need to start back at low dose glimepiride temporarily.  I will check some blood work as well as urine test as we discussed today.  If abdominal pain is worsening or not starting to improve in the next 24-48 hours I do recommend evaluation in the emergency room as you may need imaging such as CT scan.  Let me know if there are questions.   Return to the clinic or go to the nearest emergency room if any of your symptoms worsen or new symptoms occur.  [employer] Advisor 2018 (pp. [phone]). [place], [place]: Elsevier.">  Balanitis  Balanitis is swelling and irritation of the head of the penis (glans penis). Balanitis occurs most often among males who have not had their foreskin removed (uncircumcised). In uncircumcised males, the condition may also cause inflammation of theskin around the foreskin. Balanitis sometimes causes scarring of the penis or foreskin, which can require surgery. This condition may develop because of an infection or another medicalcondition. Untreated balanitis can increase the risk of penile cancer. What are the causes? Common causes of this condition include: Poor personal hygiene, especially in uncircumcised males. Not cleaning the glans penis and foreskin well can result in a buildup of bacteria, viruses, and yeast, which can lead to infection and inflammation. Irritation and lack of air flow due to fluid (smegma) that can build up on the glans penis. Other causes include: Chemical irritation from products such as soaps or shower gels, especially those that have fragrance. Chemical irritation can also be caused by condoms,  personal lubricants, petroleum jelly, spermicides, or fabric softeners. Skin conditions, such as eczema, dermatitis, and psoriasis. Allergies to medicines, such as tetracycline and sulfa drugs. What increases the risk? The following factors may make you more likely to develop this condition: Being an uncircumcised male. Having diabetes. Having other medical conditions, including liver cirrhosis, congestive heart failure, or kidney disease. Having infections, such as candidiasis, HPV (human papillomavirus), herpes simplex, gonorrhea, or syphilis. Having a tight foreskin that is difficult to pull back (retract) past the glans penis. Being severely obese. What are the signs or symptoms? Symptoms of this condition include: Discharge from under the foreskin, and pain or difficulty retracting the foreskin. A bad smell or itchiness on the penis. Tenderness, redness, and swelling of the glans penis. A rash or sores on the glans penis or foreskin. Inability to get an erection due to pain. Difficulty urinating. Scarring of the penis or foreskin, in some cases. How is this diagnosed? This condition may be diagnosed based on a physical exam and tests of a swab ofdischarge to check for bacterial or fungal infection. You may also have blood tests to check for: Viruses that can cause balanitis. A high blood sugar (glucose) level. This could be a sign of diabetes, which can increase the risk of balanitis. How is this treated? Treatment for balanitis depends on the cause. Treatment may include: Improving personal hygiene. Your health care provider may recommend sitting in a bath of warm water that is deep enough to cover your hips and buttocks (sitz bath). Taking medicines such as: Creams or ointments to reduce swelling (steroids) or to  treat an infection. Antibiotic medicine. Antifungal medicine. Having surgery to remove or cut the foreskin (circumcision). This may be done if you have scarring on the  foreskin that makes it difficult to retract. Controlling other medical problems that may be causing your condition or making it worse. Follow these instructions at home: Medicines Take over-the-counter and prescription medicines only as told by your health care provider. If you were prescribed an antibiotic medicine or a cream or ointment, use it as told by your health care provider. Do not stop using your medicine, cream, or ointment even if you start to feel better. General instructions Do not have sex until the condition clears up, or until your health care provider approves. Keep your penis clean and dry. Take sitz baths as recommended by your health care provider. Avoid products that irritate your skin or make symptoms worse, such as soaps and shower gels that have fragrance. Keep all follow-up visits as told by your health care provider. This is important. Contact a health care provider if: Your symptoms get worse or do not improve with home care. You develop chills or a fever. You have trouble urinating. You cannot retract your foreskin. Get help right away if: You develop severe pain. You are unable to urinate. Summary Balanitis is inflammation of the head of the penis (glans penis) caused by irritation or infection. This condition is most common among uncircumcised males. Balanitis causes pain, redness, and swelling of the glans penis. Good personal hygiene is important. Treatment may include improving personal hygiene and applying creams or ointments. Contact a health care provider if your symptoms get worse or do not improve with home care. This information is not intended to replace advice given to you by your health care provider. Make sure you discuss any questions you have with your healthcare provider. Document Revised: 10/04/2019 Document Reviewed: 10/04/2019 Elsevier Patient Education  2022 [employer] Prostatitis  Prostatitis is swelling or inflammation of the  prostate gland, also called the prostate. This gland is about 1.5 inches wide and 1 inch high, and it is involved in making semen. The prostate is located below a man's bladder, infront of the rectum. There are four types of prostatitis: Chronic prostatitis (CP), also called chronic pelvic pain syndrome (CPPS). This is the most common type of prostatitis. It is associated with increased muscle tone in the area between the hip bones (pelvic area), around the prostate. This type is also known as a pelvic floor disorder. Chronic bacterial prostatitis. This type usually results from an acute bacterial infection in the prostate gland that keeps coming back or has not been treated properly. The symptoms are less severe than those caused by acute bacterial prostatitis, which lasts a shorter time. Asymptomatic inflammatory prostatitis. This type does not have symptoms and does not need treatment. This is diagnosed when tests are done for other disorders of the urinary tract or reproductive tract. Acute bacterial prostatitis. This type starts quickly and results from an acute bacterial infection in the prostate gland. It is usually associated with a bladder infection, high fever, and chills. This is the least common type of prostatitis. What are the causes? Bacterial prostatitis is caused by an infection from bacteria. Chronic nonbacterial prostatitis may be caused by: Factors related to the nervous system. This system includes thebrain, spinal cord, and nerves. An autoimmune response. This happens when the body's disease-fighting system attacks healthy tissue in the body by mistake. Psychological factors. These have to do with how the mind works.  The causes of the other types of prostatitis are usually not known. What are the signs or symptoms? Symptoms of this condition depend on the type of prostatitis you have. Acute bacterial prostatitis Symptoms may include: Pain or burning during urination. Frequent  and sudden urges to urinate. Trouble starting to urinate. Fever. Chills. Pain in your muscles or joints, lower back, or lower abdomen. Other types of prostatitis Symptoms may include: Sudden urges to urinate, or urinating often. Trouble starting to urinate. Weak urine stream. Dribbling after urination. Discharge coming from the penis. Pain in the testicles, the penis, or the tip of the penis. Pain in the area in front of the rectum and below the scrotum (perineum). Pain when ejaculating. How is this diagnosed? This condition may be diagnosed based on: A physical and medical exam. A digital rectal exam. For this, the health care provider may use a finger to feel the prostate. A urine test to check for bacteria. A semen sample or blood tests. Ultrasound. Urodynamic tests to check how your body handles urine. Cystoscopy to look inside your bladder or inside the part of your body that drains urine from the bladder (urethra). How is this treated? Treatment for this condition depends on the type of prostatitis. Treatment may involve: Medicines to relieve pain or inflammation, or to help relax your muscles. Physical therapy. Heat therapy. Biofeedback. These techniques help you control certain body functions. Relaxation exercises. Antibiotic medicine, if your condition is caused by bacteria. Sitz baths. These warm water baths help to relax your pelvic floor muscles, which helps to relieve pressure on the prostate. Follow these instructions at home: Medicines Take over-the-counter and prescription medicines only as told by your health care provider. If you were prescribed an antibiotic medicine, take it as told by your health care provider. Do not stop using the antibiotic even if you start to feel better. Managing pain and swelling  Take sitz baths as directed by your health care provider. For a sitz bath, sit in warm water that is deep enough to cover your hips and buttocks. If  directed, apply heat to the affected area as often as told by your health care provider. Use the heat source that your health care provider recommends, such as a moist heat pack or a heating pad. Place a towel between your skin and the heat source. Leave the heat on for 20-30 minutes. Remove the heat if your skin turns bright red. This is especially important if you are unable to feel pain, heat, or cold. You may have a greater risk of getting burned.  General instructions Do exercises as told by your health care provider, if you were prescribed physical therapy, biofeedback, or relaxation exercises. Keep all follow-up visits as told by your health care provider. This is important. Where to find more information [employer]: LowApproval.sehttps://www.niddk.nih.gov Contact a health care provider if: Your symptoms get worse. You have a fever. Get help right away if: You have chills. You feel light-headed or feel like you may faint. You cannot urinate. You have blood or blood clots in your urine. Summary Prostatitis is swelling or inflammation of the prostate gland. Treatment for this condition depends on the type of prostatitis. Take over-the-counter and prescription medicines only as told by your health care provider. Get help right away of you have chills, feel light-headed, feel like you may faint, cannot urinate, or have blood or blood clots in your urine. This information is not intended  to replace advice given to you by your health care provider. Make sure you discuss any questions you have with your healthcare provider. Document Revised: 01/19/2020 Document Reviewed: 01/19/2020 Elsevier Patient Education  2022 Elsevier Inc. Epididymitis  Epididymitis is swelling (inflammation) or infection of the epididymis. The epididymis is a cord-like structure that is located along the top and back part of the testicle. It collects and storessperm from the  testicle. This condition can also cause pain and swelling of the testicle and scrotum. Symptoms usually start suddenly (acute epididymitis). Sometimes epididymitis starts gradually and lasts for a while (chronic epididymitis). This type may be harder to treat. What are the causes? In men ages 26-40, this condition is usually caused by a bacterial infection or a sexually transmitted disease (STD), such as: Gonorrhea. Chlamydia. In men 22 and older who do not have anal sex, this condition is usually caused by bacteria from a blockage or from abnormalities in the urinary system. These can result from: Having a tube placed into the bladder (urinary catheter). Having an enlarged or inflamed prostate gland. Having recently had urinary tract surgery. Having a problem with a backward flow of urine (retrograde). In men who have a condition that weakens the body's defense system (immune system), such as HIV, this condition can be caused by: Other bacteria, including tuberculosis and syphilis. Viruses. Fungi. Sometimes this condition occurs without infection. This may happen because oftrauma or repetitive activities such as sports. What increases the risk? You are more likely to develop this condition if you have: Unprotected sex with more than one partner. Anal sex. Recently had surgery. A urinary catheter. Urinary problems. A suppressed immune system. What are the signs or symptoms? This condition usually begins suddenly with chills, fever, and pain behind the scrotum and in the testicle. Other symptoms include: Swelling of the scrotum, testicle, or both. Pain when ejaculating or urinating. Pain in the back or abdomen. Nausea. Itching and discharge from the penis. A frequent need to pass urine. Redness, increased warmth, and tenderness of the scrotum. How is this diagnosed? Your health care provider can diagnose this condition based on your symptoms and medical history. Your health care  provider will also do a physical exam to ask about your symptoms and check your scrotum and testicle for swelling, pain, and redness. You may also have other tests, including: Examination of discharge from the penis. Urine tests for infections, such as STDs. Ultrasound test for blood flow and inflammation. Your health care provider may test you for other STDs, including HIV. How is this treated? Treatment for this condition depends on the cause. If your condition is caused by a bacterial infection, oral antibiotic medicine may be prescribed. If the bacterial infection has spread to your blood, you may need to receive IVantibiotics. For both bacterial and nonbacterial epididymitis, you may be treated with: Rest. Elevation of the scrotum. Pain medicines. Anti-inflammatory medicines. Surgery may be needed to treat: Bacterial epididymitis that causes pus to build up in the scrotum (abscess). Chronic epididymitis that has not responded to other treatments. Follow these instructions at home: Medicines Take over-the-counter and prescription medicines only as told by your health care provider. If you were prescribed an antibiotic medicine, take it as told by your health care provider. Do not stop taking the antibiotic even if your condition improves. Sexual activity If your epididymitis was caused by an STD, avoid sexual activity until your treatment is complete. Inform your sexual partner or partners if you test positive for an  STD. They may need to be treated. Do not engage in sexual activity with your partner or partners until their treatment is completed. Managing pain and swelling  If directed, elevate your scrotum and apply ice. Put ice in a plastic bag. Place a small towel or pillow between your legs. Rest your scrotum on the pillow or towel. Place another towel between your skin and the plastic bag. Leave the ice on for 20 minutes, 2-3 times a day. Try taking a sitz bath to help with  discomfort. This is a warm water bath that is taken while you are sitting down. The water should only come up to your hips and should cover your buttocks. Do this 3-4 times per day or as told by your health care provider. Keep your scrotum elevated and supported while resting. Ask your health care provider if you should wear a scrotal support, such as a jockstrap. Wear it as told by your health care provider.  General instructions Return to your normal activities as told by your health care provider. Ask your health care provider what activities are safe for you. Drink enough fluid to keep your urine pale yellow. Keep all follow-up visits as told by your health care provider. This is important. Contact a health care provider if: You have a fever. Your pain medicine is not helping. Your pain is getting worse. Your symptoms do not improve within 3 days. Summary Epididymitis is swelling (inflammation) or infection of the epididymis. This condition can also cause pain and swelling of the testicle and scrotum. Treatment for this condition depends on the cause. If your condition is caused by a bacterial infection, oral antibiotic medicine may be prescribed. Inform your sexual partner or partners if you test positive for an STD. They may need to be treated. Do not engage in sexual activity with your partner or partners until their treatment is completed. Contact a health care provider if your symptoms do not improve within 3 days. This information is not intended to replace advice given to you by your health care provider. Make sure you discuss any questions you have with your healthcare provider. Document Revised: 10/17/2018 Document Reviewed: 10/18/2018 Elsevier Patient Education  2022 [employer]. Abdominal Pain, Adult Pain in the abdomen (abdominal pain) can be caused by many things. Often, abdominal pain is not serious and it gets better with no treatment or by being treated at home. However,  sometimesabdominal pain is serious. Your health care provider will ask questions about your medical history and doa physical exam to try to determine the cause of your abdominal pain. Follow these instructions at home: Medicines Take over-the-counter and prescription medicines only as told by your health care provider. Do not take a laxative unless told by your health care provider. General instructions  Watch your condition for any changes. Drink enough fluid to keep your urine pale yellow. Keep all follow-up visits as told by your health care provider. This is important.  Contact a health care provider if: Your abdominal pain changes or gets worse. You are not hungry or you lose weight without trying. You are constipated or have diarrhea for more than 2-3 days. You have pain when you urinate or have a bowel movement. Your abdominal pain wakes you up at night. Your pain gets worse with meals, after eating, or with certain foods. You are vomiting and cannot keep anything down. You have a fever. You have blood in your urine. Get help right away if: Your pain does not  go away as soon as your health care provider told you to expect. You cannot stop vomiting. Your pain is only in areas of the abdomen, such as the right side or the left lower portion of the abdomen. Pain on the right side could be caused by appendicitis. You have bloody or black stools, or stools that look like tar. You have severe pain, cramping, or bloating in your abdomen. You have signs of dehydration, such as: Dark urine, very little urine, or no urine. Cracked lips. Dry mouth. Sunken eyes. Sleepiness. Weakness. You have trouble breathing or chest pain. Summary Often, abdominal pain is not serious and it gets better with no treatment or by being treated at home. However, sometimes abdominal pain is serious. Watch your condition for any changes. Take over-the-counter and prescription medicines only as told by  your health care provider. Contact a health care provider if your abdominal pain changes or gets worse. Get help right away if you have severe pain, cramping, or bloating in your abdomen. This information is not intended to replace advice given to you by your health care provider. Make sure you discuss any questions you have with your healthcare provider. Document Revised: 02/02/2020 Document Reviewed: 04/24/2019 Elsevier Patient Education  2022 [employer].

## 2021-06-15 LAB — URINE CULTURE
MICRO NUMBER:: 12023086
Result:: NO GROWTH
SPECIMEN QUALITY:: ADEQUATE

## 2021-08-12 ENCOUNTER — Other Ambulatory Visit: Payer: Self-pay

## 2021-08-12 ENCOUNTER — Other Ambulatory Visit (INDEPENDENT_AMBULATORY_CARE_PROVIDER_SITE_OTHER): Payer: 59

## 2021-08-12 DIAGNOSIS — E119 Type 2 diabetes mellitus without complications: Secondary | ICD-10-CM

## 2021-08-12 LAB — COMPREHENSIVE METABOLIC PANEL
ALT: 40 U/L (ref 0–53)
AST: 29 U/L (ref 0–37)
Albumin: 4.5 g/dL (ref 3.5–5.2)
Alkaline Phosphatase: 73 U/L (ref 39–117)
BUN: 12 mg/dL (ref 6–23)
CO2: 30 mEq/L (ref 19–32)
Calcium: 10.2 mg/dL (ref 8.4–10.5)
Chloride: 103 mEq/L (ref 96–112)
Creatinine, Ser: 1.11 mg/dL (ref 0.40–1.50)
GFR: 79.23 mL/min (ref >60.00)
Glucose, Bld: 109 mg/dL — ABNORMAL HIGH (ref 70–99)
Potassium: 4.5 mEq/L (ref 3.5–5.1)
Sodium: 139 mEq/L (ref 135–145)
Total Bilirubin: 0.8 mg/dL (ref 0.2–1.2)
Total Protein: 7.8 g/dL (ref 6.0–8.3)

## 2021-08-12 LAB — HEMOGLOBIN A1C: Hgb A1c MFr Bld: 7 % — ABNORMAL HIGH (ref 4.6–6.5)

## 2021-08-15 ENCOUNTER — Ambulatory Visit: Payer: 59 | Admitting: Endocrinology

## 2021-08-16 ENCOUNTER — Other Ambulatory Visit: Payer: Self-pay | Admitting: Endocrinology

## 2021-09-09 ENCOUNTER — Ambulatory Visit (INDEPENDENT_AMBULATORY_CARE_PROVIDER_SITE_OTHER): Payer: 59 | Admitting: Endocrinology

## 2021-09-09 ENCOUNTER — Other Ambulatory Visit: Payer: Self-pay

## 2021-09-09 ENCOUNTER — Encounter: Payer: Self-pay | Admitting: Endocrinology

## 2021-09-09 VITALS — BP 124/74 | HR 72 | Ht 67.0 in | Wt 156.2 lb

## 2021-09-09 DIAGNOSIS — E1165 Type 2 diabetes mellitus with hyperglycemia: Secondary | ICD-10-CM | POA: Diagnosis not present

## 2021-09-09 DIAGNOSIS — E785 Hyperlipidemia, unspecified: Secondary | ICD-10-CM | POA: Diagnosis not present

## 2021-09-09 NOTE — Patient Instructions (Addendum)
Exercise 5/7 days  Check blood sugars on waking up 2-3 days a week  Also check blood sugars about 2 hours after meals and do this after different meals by rotation  Recommended blood sugar levels on waking up are 90-130 and about 2 hours after meal is 130-160  Please bring your blood sugar monitor to each visit, thank you

## 2021-09-09 NOTE — Progress Notes (Signed)
Patient ID: Joe Boyd, male   DOB: [birth year], 45 y.o.   MRN: [id number]           Reason for Appointment: Follow-up visit for Type 2 Diabetes   History of Present Illness:          Diagnosis: Type 2 diabetes mellitus, date of diagnosis: 2012        Past history:  At time of diagnosis he was having symptoms of increased thirst and urination and he was seen by his physician when he started feeling weak and dizzy also. His A1c at diagnosis was 12.8. He thinks he was treated with metformin only and also he started changing his diet along with eliminating sweet soft drinks.  Initially was treated with only 500 mg of metformin ER and in 2014 this was increased to 1000 mg.  He has not had any other medications for diabetes. He was last seen by his endocrinologist in [place] in 07/2014 when his A1c was 6.5 and no changes were made A1c of over 7%  in 7/16 and he was started on Kombiglyze XR In 6/17 with his A1c going up to 7.6 he was given Glyset 25 mg at lunch also in addition to [employer]  Recent history:   A1c is slightly better at 7       Oral hypoglycemic drugs :  Acarbose 50 mg at breakfast and 100 mg at lunch , Januvia 100 mg daily, Amaryl 0.5 mg at bedtime  Current management, blood sugar patterns and problems identified: Although he thinks his fasting blood sugars are 130 his lab glucose was 109 fasting  He does not know if his test strips are expired, on the last visit he had only 2 readings in the previous month  Previously was exercising but he has not been doing so except occasionally  Again he thinks that he is not eating a full meal in the evening  However is trying to take his acarbose consistently at breakfast and lunch, now taking 100 mg before lunch  Does try to take Amaryl at bedtime which is helping his fasting readings without hypoglycemia Weight is about the same compared to his last visit in April      Side effects from medications have been: None   Glucose  monitoring:  done occasionally,        Glucometer:  Contour      Blood Glucose readings: Around 130-140 at different times by recall  Blood sugars previously 113 and 154    Self-care:  Meals:  1-2 meals per day.  Tries to get some protein with his lunch, sometimes yogurt, sometimes has chicken.    Usually avoiding snacks                  Dietician visit, most recent: 7/16                Weight history:   Wt Readings from Last 3 Encounters:  09/09/21 156 lb 3.2 oz (70.9 kg)  06/13/21 152 lb 9.6 oz (69.2 kg)  04/15/21 155 lb 12.8 oz (70.7 kg)    Glycemic control:   Lab Results  Component Value Date   HGBA1C 7.0 (H) 08/12/2021   HGBA1C 7.1 (H) 04/08/2021   HGBA1C 7.5 (H) 12/17/2020   Lab Results  Component Value Date   MICROALBUR 0.9 04/08/2021   LDLCALC 37 04/08/2021   CREATININE 1.11 08/12/2021   No visits with results within 1 Week(s) from this visit.  Latest known visit  with results is:  Lab on 08/12/2021  Component Date Value Ref Range Status   Sodium 08/12/2021 139  135 - 145 mEq/L Final   Potassium 08/12/2021 4.5  3.5 - 5.1 mEq/L Final   Chloride 08/12/2021 103  96 - 112 mEq/L Final   CO2 08/12/2021 30  19 - 32 mEq/L Final   Glucose, Bld 08/12/2021 109 (A) 70 - 99 mg/dL Final   BUN 08/12/2021 12  6 - 23 mg/dL Final   Creatinine, Ser 08/12/2021 1.11  0.40 - 1.50 mg/dL Final   Total Bilirubin 08/12/2021 0.8  0.2 - 1.2 mg/dL Final   Alkaline Phosphatase 08/12/2021 73  39 - 117 U/L Final   AST 08/12/2021 29  0 - 37 U/L Final   ALT 08/12/2021 40  0 - 53 U/L Final   Total Protein 08/12/2021 7.8  6.0 - 8.3 g/dL Final   Albumin 08/12/2021 4.5  3.5 - 5.2 g/dL Final   GFR 08/12/2021 79.23  >60.00 mL/min Final   Calculated using the CKD-EPI Creatinine Equation (2021)   Calcium 08/12/2021 10.2  8.4 - 10.5 mg/dL Final   Hgb A1c MFr Bld 08/12/2021 7.0 (A) 4.6 - 6.5 % Final   Glycemic Control Guidelines for People with Diabetes:Non Diabetic:  <6%Goal of Therapy:  <7%Additional Action Suggested:  >8%       Allergies as of 09/09/2021       Reactions   Augmentin [amoxicillin-pot Clavulanate] Itching   Diclofenac    Abnormal liver functions        Medication List        Accurate as of September 09, 2021 11:50 AM. If you have any questions, ask your nurse or doctor.          acarbose 50 MG tablet Commonly known as: PRECOSE Take 2 tablet by mouth daily at lunch.  May also take 1 tab at breakfast and dinner if eating   albuterol 108 (90 Base) MCG/ACT inhaler Commonly known as: VENTOLIN HFA Inhale 2 puffs into the lungs every 6 (six) hours as needed for wheezing or shortness of breath.   atorvastatin 10 MG tablet Commonly known as: LIPITOR Take 1 tablet (10 mg total) by mouth daily.   clotrimazole 1 % cream Commonly known as: LOTRIMIN Apply 1 application topically 2 (two) times daily.   glimepiride 1 MG tablet Commonly known as: Amaryl Take 1 tablet (1 mg total) by mouth at bedtime.   Januvia 100 MG tablet Generic drug: sitaGLIPtin TAKE 1 TABLET BY MOUTH ONCE A DAY   multivitamin with minerals Tabs tablet Take 1 tablet by mouth daily.   nitroGLYCERIN 0.2 mg/hr patch Commonly known as: NITRODUR - Dosed in mg/24 hr Apply 1/4 patch daily to tendon for tendonitis.   OneTouch Delica Lancets 85I Misc Use to check blood sugars daily-Dx code E11.65   OneTouch Verio Sync System w/Device Kit Use to check blood sugars daily-Dx code E11.65   OneTouch Verio test strip Generic drug: glucose blood Use to check blood sugars daily-Dx code E11.65        Allergies:  Allergies  Allergen Reactions   Augmentin [Amoxicillin-Pot Clavulanate] Itching   Diclofenac     Abnormal liver functions    Past Medical History:  Diagnosis Date   Diabetes mellitus without complication ([place])    Tuberculosis    Finished Rx in 8/15    No past surgical history on file.  Family History  Problem Relation Age of Onset   Diabetes Mother  Hypertension Mother    Thyroid disease Mother    Heart disease Mother    Diabetes Father    Hypertension Father    Heart disease Father    Breast cancer Sister        72s   Heart disease Paternal Uncle    Cancer Neg Hx    Colon cancer Neg Hx     Social History:  reports that he has never smoked. He has never used smokeless tobacco. He reports current alcohol use. He reports that he does not use drugs.    Review of Systems        Lipids:  LDL and triglycerides have been variable Started on atorvastatin 10 mg daily in 4/21 because of high LDL and cardiovascular risk factors LDL is controlled although HDL stays low       Lab Results  Component Value Date   CHOL 90 04/08/2021   CHOL 101 08/19/2020   CHOL 168 04/15/2020   Lab Results  Component Value Date   HDL 37.80 (L) 04/08/2021   HDL 37.30 (L) 08/19/2020   HDL 49.80 04/15/2020   Lab Results  Component Value Date   LDLCALC 37 04/08/2021   LDLCALC 44 08/19/2020   LDLCALC 108 (H) 04/15/2020   Lab Results  Component Value Date   TRIG 78.0 04/08/2021   TRIG 100.0 08/19/2020   TRIG 54.0 04/15/2020   Lab Results  Component Value Date   CHOLHDL 2 04/08/2021   CHOLHDL 3 08/19/2020   CHOLHDL 3 04/15/2020   No results found for: LDLDIRECT                 Thyroid: History of a small goiter with normal TSH   Lab Results  Component Value Date   TSH 0.98 10/04/2019   HYPERKALEMIA:  His potassium had been previously high at times without obvious cause  BP Readings from Last 3 Encounters:  09/09/21 124/74  06/13/21 108/70  04/15/21 124/82      Lab Results  Component Value Date   K 4.5 08/12/2021   Abnormal liver function: improved with stopping Voltaren previously taken for his shoulder pain   Lab Results  Component Value Date   ALT 40 08/12/2021   HYPERCALCEMIA: He appears to have had high normal or high calcium levels, no history of kidney stones or renal dysfunction  He was told to stop his  calcium supplement when calcium was high in 10/21 Calcium upper normal  Lab Results  Component Value Date   CALCIUM 10.2 08/12/2021   CALCIUM 10.1 06/13/2021   CALCIUM 10.2 04/08/2021   CALCIUM 10.1 12/17/2020   CALCIUM 9.1 11/29/2020   CALCIUM 10.8 (H) 10/14/2020     LABS:  No visits with results within 1 Week(s) from this visit.  Latest known visit with results is:  Lab on 08/12/2021  Component Date Value Ref Range Status   Sodium 08/12/2021 139  135 - 145 mEq/L Final   Potassium 08/12/2021 4.5  3.5 - 5.1 mEq/L Final   Chloride 08/12/2021 103  96 - 112 mEq/L Final   CO2 08/12/2021 30  19 - 32 mEq/L Final   Glucose, Bld 08/12/2021 109 (A) 70 - 99 mg/dL Final   BUN 08/12/2021 12  6 - 23 mg/dL Final   Creatinine, Ser 08/12/2021 1.11  0.40 - 1.50 mg/dL Final   Total Bilirubin 08/12/2021 0.8  0.2 - 1.2 mg/dL Final   Alkaline Phosphatase 08/12/2021 73  39 - 117 U/L Final   AST  08/12/2021 29  0 - 37 U/L Final   ALT 08/12/2021 40  0 - 53 U/L Final   Total Protein 08/12/2021 7.8  6.0 - 8.3 g/dL Final   Albumin 08/12/2021 4.5  3.5 - 5.2 g/dL Final   GFR 08/12/2021 79.23  >60.00 mL/min Final   Calculated using the CKD-EPI Creatinine Equation (2021)   Calcium 08/12/2021 10.2  8.4 - 10.5 mg/dL Final   Hgb A1c MFr Bld 08/12/2021 7.0 (A) 4.6 - 6.5 % Final   Glycemic Control Guidelines for People with Diabetes:Non Diabetic:  <6%Goal of Therapy: <7%Additional Action Suggested:  >8%     Physical Examination:  BP 124/74   Pulse 72   Ht 5' 7" (1.702 m)   Wt 156 lb 3.2 oz (70.9 kg)   SpO2 98%   BMI 24.46 kg/m       ASSESSMENT:  Diabetes type 2 , nonobese See history of present illness for detailed discussion of current diabetes management, blood sugar patterns and problems identified  His A1c 7  Blood sugars are appearing well controlled  Again there is lack of adequate glucose monitoring to know whether he is having some postprandial high sugars  He can also do better with  exercise   History of abnormal liver functions, resolved and likely to be from taking Motrin previously  PLAN:   Continue current medication regimen including acarbose 50 mg at breakfast and 100 mg at lunchtime  He will only take dinnertime dose if he is eating significant carbohydrates  Needs consistent glucose monitoring especially after meals  Needs to set aside some time for exercise at least 5 days a week  Follow-up in 4 months    Ajay Kumar 09/09/2021, 11:50 AM   Note: This office note was prepared with Dragon voice recognition system technology. Any transcriptional errors that result from this process are unintentional.

## 2021-10-14 ENCOUNTER — Ambulatory Visit (INDEPENDENT_AMBULATORY_CARE_PROVIDER_SITE_OTHER): Payer: 59 | Admitting: Physician Assistant

## 2021-10-14 ENCOUNTER — Encounter: Payer: Self-pay | Admitting: Physician Assistant

## 2021-10-14 ENCOUNTER — Other Ambulatory Visit (HOSPITAL_COMMUNITY)
Admission: RE | Admit: 2021-10-14 | Discharge: 2021-10-14 | Disposition: A | Discharge status: Home / Self Care | Payer: 59 | Source: Ambulatory Visit | Attending: Physician Assistant | Admitting: Physician Assistant

## 2021-10-14 ENCOUNTER — Other Ambulatory Visit: Payer: Self-pay

## 2021-10-14 VITALS — BP 118/78 | HR 88 | Temp 98.0°F | Ht 67.0 in | Wt 153.0 lb

## 2021-10-14 DIAGNOSIS — M25512 Pain in left shoulder: Secondary | ICD-10-CM

## 2021-10-14 DIAGNOSIS — R21 Rash and other nonspecific skin eruption: Secondary | ICD-10-CM

## 2021-10-14 DIAGNOSIS — Z1159 Encounter for screening for other viral diseases: Secondary | ICD-10-CM | POA: Diagnosis not present

## 2021-10-14 DIAGNOSIS — G8929 Other chronic pain: Secondary | ICD-10-CM

## 2021-10-14 DIAGNOSIS — Z0001 Encounter for general adult medical examination with abnormal findings: Secondary | ICD-10-CM | POA: Diagnosis not present

## 2021-10-14 DIAGNOSIS — E1165 Type 2 diabetes mellitus with hyperglycemia: Secondary | ICD-10-CM

## 2021-10-14 MED ORDER — FLUCONAZOLE 150 MG PO TABS: ORAL_TABLET | ORAL | 0 refills | Status: DC

## 2021-10-14 NOTE — Progress Notes (Signed)
Subjective:    Joe Boyd is a 45 y.o. male and is here for a comprehensive physical exam.  HPI  Health Maintenance Due  Topic Date Due   Hepatitis C Screening  Never done   INFLUENZA VACCINE  07/28/2021    Acute Concerns: Penile rash -- Recently patient has noticed some issues with the head of his penis - he has experienced dryness and blood from the head of his penis.  In June he saw another provider for this and was diagnosed with balanitis as well as prostatitis.  At the time he was also experiencing pain on the right side of his pelvic region that has since resolved itself. Joe Boyd has tried clotrimazole topical cream that provided minor relief. Denies changes in environment and contraception reactions.   Chronic Issues: Chronic Left Shoulder Pain Back in August of last year, Joe Boyd visited Dr. Logan Bores, sports medicine , as referred by me for chronic pain in left shoulder. Patient was dx with subacromial bursitis and a labral tear and is still experiencing pain in his left shoulder that radiates down his arm into his wrist. The pain worsens at night and upon palpation to the area. Patient has found minor relief in nitroglycerin patches, but is now considering orthopedic surgery consultation.   Diabetes Currently compliant with januvia 100 mg, amaryl 1 mg,  and acarbose 50 mg with no adverse effects. At the moment he is seeing Dr. Lucianne Muss, endocrinology, for this and is managing well.     Health Maintenance: Immunizations -- Influenza Vaccine- Last completed 10/14/21. Tdap- Last completed 03/18/15. COVID- Last completed 12/25/20 (Pfizer-3 doses) Colonoscopy -- Last completed 11/28/19. Findings were internal hemorrhoids were found during retroflexion. The hemorrhoids were small and Grade I (internal hemorrhoids that do not prolapse). - The exam was otherwise without abnormality on direct and retroflexion views. PSA --  Lab Results  Component Value Date   PSA 0.33  06/13/2021   Diet -- Normal Opthalmology- Sees one regularly Dentistry- Currently not up to date. Exercise -- Temporarily helps mother with knee exercises. Weight -- Stable Weight history Wt Readings from Last 10 Encounters:  10/14/21 153 lb (69.4 kg)  09/09/21 156 lb 3.2 oz (70.9 kg)  06/13/21 152 lb 9.6 oz (69.2 kg)  04/15/21 155 lb 12.8 oz (70.7 kg)  12/25/20 153 lb 3.2 oz (69.5 kg)  12/10/20 155 lb 8 oz (70.5 kg)  10/16/20 152 lb (68.9 kg)  10/01/20 154 lb (69.9 kg)  09/23/20 151 lb (68.5 kg)  08/21/20 147 lb (66.7 kg)   Body mass index is 23.96 kg/m. Mood -- Stable Tobacco use -- Never Tobacco Use: Low Risk    Smoking Tobacco Use: Never   Smokeless Tobacco Use: Never    Alcohol use ---  reports current alcohol use.   Depression screen PHQ 2/9 10/14/2021  Decreased Interest 0  Down, Depressed, Hopeless 0  PHQ - 2 Score 0     Other providers/specialists: Patient Care Team: Jarold Motto, [place] as PCP - General (Physician Assistant) Reather Littler, MD as Consulting Physician (Endocrinology) Luciana Axe Alford Highland, MD as Consulting Physician (Ophthalmology)   PMHx, SurgHx, SocialHx, Medications, and Allergies were reviewed in the Visit Navigator and updated as appropriate.   Past Medical History:  Diagnosis Date   Diabetes mellitus without complication (HCC)    Tuberculosis    Finished Rx in 8/15    History reviewed. No pertinent surgical history.   Family History  Problem Relation Age of Onset   Diabetes Mother  Hypertension Mother    Thyroid disease Mother    Heart disease Mother    Diabetes Father    Hypertension Father    Heart disease Father    Breast cancer Sister        48s   Heart disease Paternal Uncle    Cancer Neg Hx    Colon cancer Neg Hx     Social History   Tobacco Use   Smoking status: Never   Smokeless tobacco: Never  Vaping Use   Vaping Use: Never used  Substance Use Topics   Alcohol use: Yes    Comment: occasionally   Drug  use: No    Review of Systems:   Review of Systems  Constitutional:  Negative for chills, fever, malaise/fatigue and weight loss.  HENT:  Negative for hearing loss, sinus pain and sore throat.   Respiratory:  Negative for cough and hemoptysis.   Cardiovascular:  Negative for chest pain, palpitations, leg swelling and PND.  Gastrointestinal:  Negative for abdominal pain, constipation, diarrhea, heartburn, nausea and vomiting.  Genitourinary:  Negative for dysuria, frequency and urgency.  Musculoskeletal:  Negative for back pain, myalgias and neck pain.  Skin:  Negative for itching and rash.  Neurological:  Negative for dizziness, tingling, seizures and headaches.  Endo/Heme/Allergies:  Negative for polydipsia.  Psychiatric/Behavioral:  Negative for depression. The patient is not nervous/anxious.    Objective:   Vitals:   10/14/21 1046  BP: 118/78  Pulse: 88  Temp: 98 F (36.7 C)  SpO2: 96%   Body mass index is 23.96 kg/m.  General Appearance:  Alert, cooperative, no distress, appears stated age  Head:  Normocephalic, without obvious abnormality, atraumatic  Eyes:  PERRL, conjunctiva/corneas clear, EOM's intact, fundi benign, both eyes       Ears:  Normal TM's and external ear canals, both ears  Nose: Nares normal, septum midline, mucosa normal, no drainage    or sinus tenderness  Throat: Lips, mucosa, and tongue normal; teeth and gums normal  Neck: Supple, symmetrical, trachea midline, no adenopathy; thyroid:  No enlargement/tenderness/nodules; no carotit bruit or JVD  Back:   Symmetric, no curvature, ROM normal, no CVA tenderness  Lungs:   Clear to auscultation bilaterally, respirations unlabored  Chest wall:  No tenderness or deformity  Heart:  Regular rate and rhythm, S1 and S2 normal, no murmur, rub   or gallop  Abdomen:   Soft, non-tender, bowel sounds active all four quadrants, no masses, no organomegaly  Extremities: Extremities normal, atraumatic, no cyanosis or edema   Prostate: Not done.   Skin: Skin color, texture, turgor normal, no rashes or lesions  Lymph nodes: Cervical, supraclavicular, and axillary nodes normal  Neurologic: CNII-XII grossly intact. Normal strength, sensation and reflexes throughout    Assessment/Plan:   Encounter for general adult medical examination with abnormal findings Today patient counseled on age appropriate routine health concerns for screening and prevention, each reviewed and up to date or declined. Immunizations reviewed and up to date or declined. Labs ordered and reviewed. Risk factors for depression reviewed and negative. Hearing function and visual acuity are intact. ADLs screened and addressed as needed. Functional ability and level of safety reviewed and appropriate. Education, counseling and referrals performed based on assessed risks today. Patient provided with a copy of personalized plan for preventive services.   Penile rash Not examined by me today Will treat as balanitis, recommend oral Diflucan twice weekly x2 weeks Continue clotrimazole [facility]erral for [facility] placed Also update STD screening  Encounter for screening for other viral diseases Hep C ordered today  Chronic left shoulder pain Will refer to Dr. Ave Filter with [facility]  Type 2 diabetes mellitus with hyperglycemia, without long-term current use of insulin (HCC) Compliant Follow-up with Dr. Lucianne Muss as scheduled     Patient Counseling: [x]   Nutrition: Stressed importance of moderation in sodium/caffeine intake, saturated fat and cholesterol, caloric balance, sufficient intake of fresh fruits, vegetables, and fiber.  [x]   Stressed the importance of regular exercise.   []   Substance Abuse: Discussed cessation/primary prevention of tobacco, alcohol, or other drug use; driving or other dangerous activities under the influence; availability of treatment for abuse.   [x]   Injury prevention: Discussed safety belts, safety  helmets, smoke detector, smoking near bedding or upholstery.   []   Sexuality: Discussed sexually transmitted diseases, partner selection, use of condoms, avoidance of unintended pregnancy  and contraceptive alternatives.   [x]   Dental health: Discussed importance of regular tooth brushing, flossing, and dental visits.  [x]   Health maintenance and immunizations reviewed. Please refer to Health maintenance section.    , PA, have reviewed all documentation for this visit. The documentation on 10/14/21 for the exam, diagnosis, procedures, and orders are all accurate and complete.   , [name] [facility]

## 2021-10-14 NOTE — Patient Instructions (Addendum)
It was great to see you!  Referral for your shoulder will be placed today Update STD testing Continue cream Take 1 diflucan tablet, repeat again every 5-7 days if needed Referral to urology  Please go to the lab for blood work.   Our office will call you with your results unless you have chosen to receive results via MyChart.  If your blood work is normal we will follow-up each year for physicals and as scheduled for chronic medical problems.  If anything is abnormal we will treat accordingly and get you in for a follow-up.  Take care,  Lelon Mast

## 2021-10-15 LAB — HIV ANTIBODY (ROUTINE TESTING W REFLEX): HIV 1&2 Ab, 4th Generation: NONREACTIVE

## 2021-10-15 LAB — URINE CYTOLOGY ANCILLARY ONLY
Bacterial Vaginitis-Urine: NEGATIVE
Candida Urine: NEGATIVE
Chlamydia: NEGATIVE
Comment: NEGATIVE
Comment: NEGATIVE
Comment: NORMAL
Neisseria Gonorrhea: NEGATIVE
Trichomonas: NEGATIVE

## 2021-10-15 LAB — HEPATITIS C ANTIBODY
Hepatitis C Ab: NONREACTIVE
SIGNAL TO CUT-OFF: 0.14 (ref <1.00)

## 2021-10-15 LAB — RPR: RPR Ser Ql: NONREACTIVE

## 2021-10-16 ENCOUNTER — Encounter (INDEPENDENT_AMBULATORY_CARE_PROVIDER_SITE_OTHER): Payer: 59 | Admitting: Ophthalmology

## 2021-10-21 ENCOUNTER — Encounter: Payer: Self-pay | Admitting: Physician Assistant

## 2021-10-21 DIAGNOSIS — R21 Rash and other nonspecific skin eruption: Secondary | ICD-10-CM

## 2021-12-17 ENCOUNTER — Other Ambulatory Visit: Payer: Self-pay | Admitting: Endocrinology

## 2021-12-21 ENCOUNTER — Encounter: Payer: Self-pay | Admitting: Physician Assistant

## 2021-12-23 ENCOUNTER — Other Ambulatory Visit: Payer: Self-pay

## 2021-12-23 DIAGNOSIS — N481 Balanitis: Secondary | ICD-10-CM

## 2021-12-23 MED ORDER — CLOTRIMAZOLE 1 % EX CREA: 1.0000 "application " | TOPICAL_CREAM | Freq: Two times a day (BID) | CUTANEOUS | 0 refills | Status: DC

## 2021-12-29 IMAGING — MR MR SHOULDER*L* W/O CM
5 series · 40 of 40 positions shown · non-contrast
Comparison: None.

CLINICAL DATA: Left shoulder pain for 3 months.  No known injury.

EXAM:
MRI OF THE LEFT SHOULDER WITHOUT CONTRAST
TECHNIQUE: Multiplanar, multisequence MR imaging of the shoulder was performed.
No intravenous contrast was administered.

[Series 6: PD fat-sat · axial · 4.0mm · 0.59mm/px · z∈[-38,+54]mm · 8 of 22 slices shown (1 of 2)]
[im 1/22]
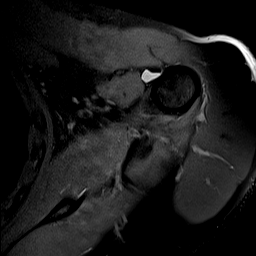
[im 4/22]
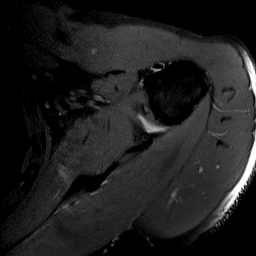
[im 7/22]
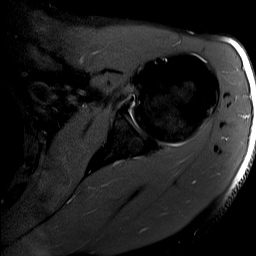
[im 10/22]
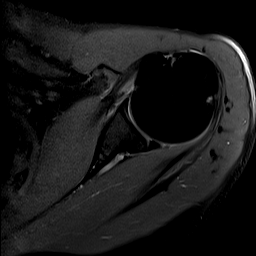
[im 13/22]
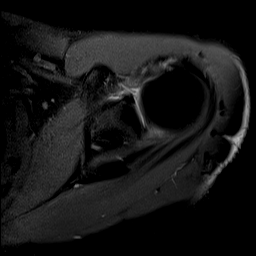
[im 16/22]
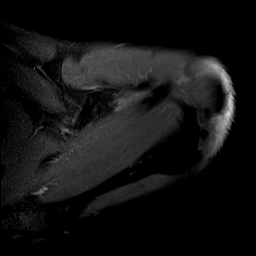
[im 19/22]
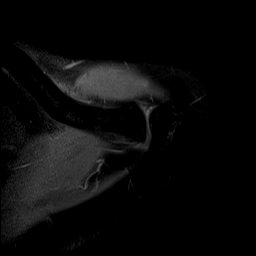
[im 22/22]
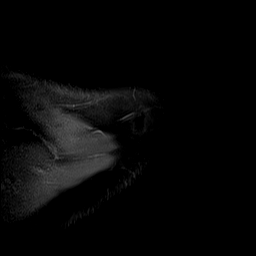

[Series 7: T2 fat-sat · oblique · 4.0mm · 0.59mm/px · 7 of 17 slices shown (1 of 2)]
[im 1/17]
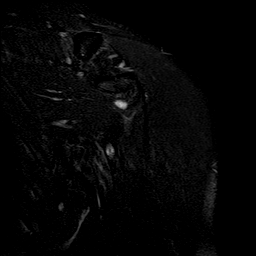
[im 3/17]
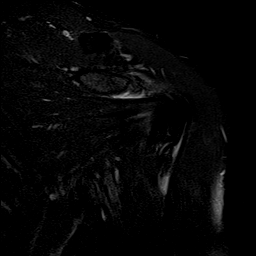
[im 6/17]
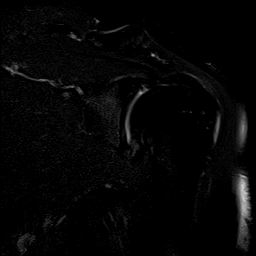
[im 9/17]
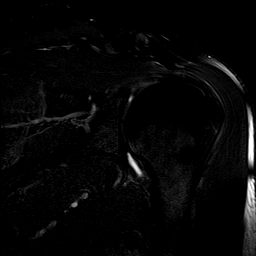
[im 11/17]
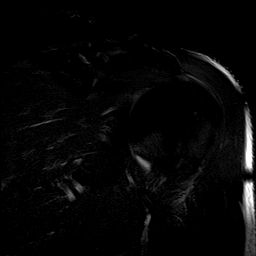
[im 14/17]
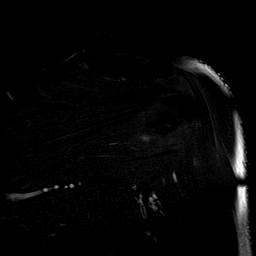
[im 17/17]
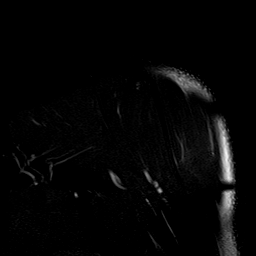

[Series 8: PD fat-sat · oblique · 4.0mm · 0.59mm/px · 7 of 17 slices shown (2 of 2)]
[im 1/17]
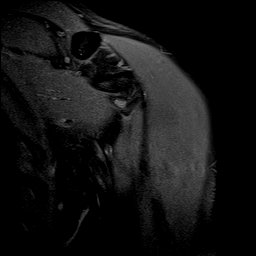
[im 3/17]
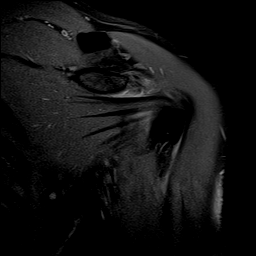
[im 6/17]
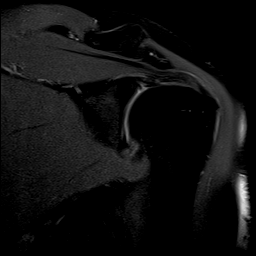
[im 9/17]
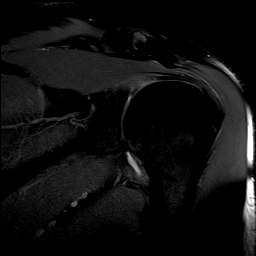
[im 11/17]
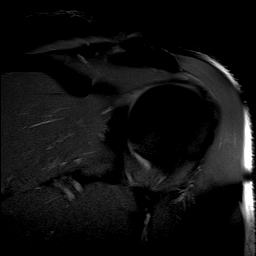
[im 14/17]
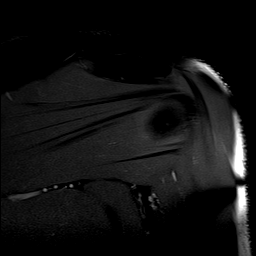
[im 17/17]
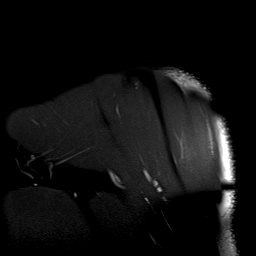

[Series 9: T1 · oblique · 4.0mm · 0.59mm/px · 9 of 23 slices shown]
[im 1/23]
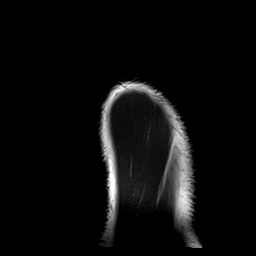
[im 3/23]
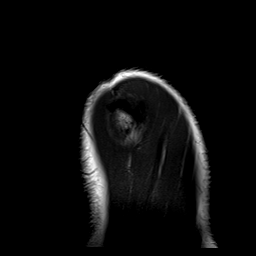
[im 6/23]
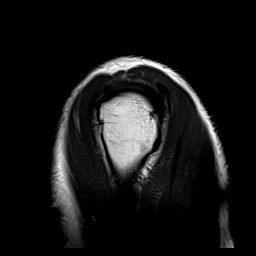
[im 9/23]
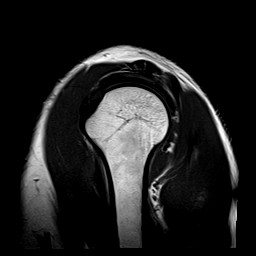
[im 12/23]
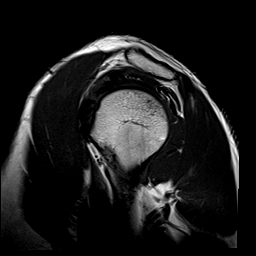
[im 14/23]
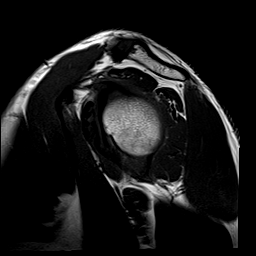
[im 17/23]
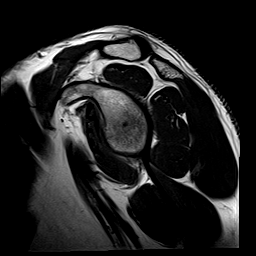
[im 20/23]
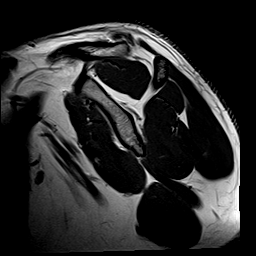
[im 23/23]
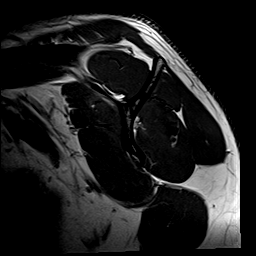

[Series 10: T2 fat-sat · oblique · 4.0mm · 0.59mm/px · 9 of 23 slices shown (2 of 2)]
[im 1/23]
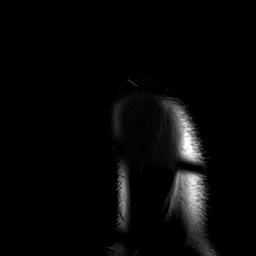
[im 3/23]
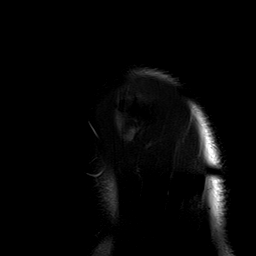
[im 6/23]
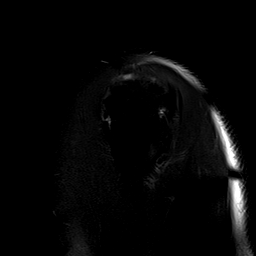
[im 9/23]
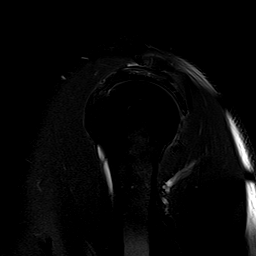
[im 12/23]
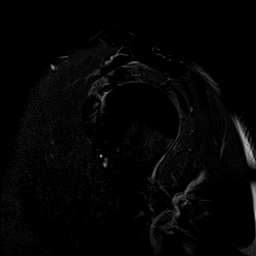
[im 14/23]
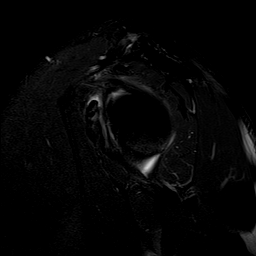
[im 17/23]
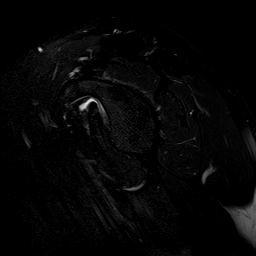
[im 20/23]
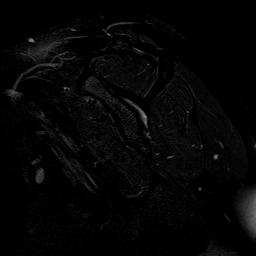
[im 23/23]
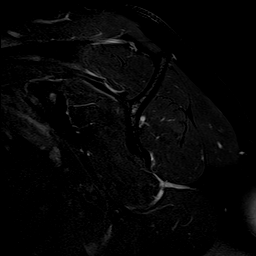

[40 of 40 positions shown; findings below may reference images not displayed]

FINDINGS: Rotator cuff: Intact. Mild supraspinatus and infraspinatus
tendinopathy noted.

Muscles:  Normal.  No atrophy or focal lesion.

Biceps long head:  Intact.

Acromioclavicular Joint: Moderate osteoarthritis. Type 1 acromion.
Small volume of fluid is seen in the subacromial/subdeltoid bursa.

Glenohumeral Joint: Appears normal.

Labrum: The superior labrum appears markedly degenerated and frayed.

Bones:  No fracture or worrisome lesion.

Other: None.
IMPRESSION: The superior labrum is markedly degenerated and frayed.

Mild appearing supraspinatus infraspinatus tendinopathy out tear.

Moderate acromioclavicular osteoarthritis.

Small volume of subacromial/subdeltoid fluid compatible with
bursitis.

## 2022-01-08 ENCOUNTER — Other Ambulatory Visit: Payer: Self-pay

## 2022-01-08 ENCOUNTER — Other Ambulatory Visit (INDEPENDENT_AMBULATORY_CARE_PROVIDER_SITE_OTHER): Payer: 59

## 2022-01-08 DIAGNOSIS — E785 Hyperlipidemia, unspecified: Secondary | ICD-10-CM

## 2022-01-08 DIAGNOSIS — E1165 Type 2 diabetes mellitus with hyperglycemia: Secondary | ICD-10-CM | POA: Diagnosis not present

## 2022-01-08 LAB — COMPREHENSIVE METABOLIC PANEL
ALT: 34 U/L (ref 0–53)
AST: 27 U/L (ref 0–37)
Albumin: 4.6 g/dL (ref 3.5–5.2)
Alkaline Phosphatase: 84 U/L (ref 39–117)
BUN: 11 mg/dL (ref 6–23)
CO2: 31 mEq/L (ref 19–32)
Calcium: 10 mg/dL (ref 8.4–10.5)
Chloride: 103 mEq/L (ref 96–112)
Creatinine, Ser: 1.2 mg/dL (ref 0.40–1.50)
GFR: 71.95 mL/min (ref >60.00)
Glucose, Bld: 122 mg/dL — ABNORMAL HIGH (ref 70–99)
Potassium: 5 mEq/L (ref 3.5–5.1)
Sodium: 140 mEq/L (ref 135–145)
Total Bilirubin: 0.7 mg/dL (ref 0.2–1.2)
Total Protein: 7.6 g/dL (ref 6.0–8.3)

## 2022-01-08 LAB — LIPID PANEL
Cholesterol: 107 mg/dL (ref 0–200)
HDL: 39.7 mg/dL (ref >39.00)
LDL Cholesterol: 46 mg/dL (ref 0–99)
NonHDL: 67.13
Total CHOL/HDL Ratio: 3
Triglycerides: 106 mg/dL (ref 0.0–149.0)
VLDL: 21.2 mg/dL (ref 0.0–40.0)

## 2022-01-08 LAB — HEMOGLOBIN A1C: Hgb A1c MFr Bld: 7.5 % — ABNORMAL HIGH (ref 4.6–6.5)

## 2022-01-12 ENCOUNTER — Other Ambulatory Visit: Payer: Self-pay

## 2022-01-12 ENCOUNTER — Encounter: Payer: Self-pay | Admitting: Endocrinology

## 2022-01-12 ENCOUNTER — Ambulatory Visit: Payer: 59 | Admitting: Endocrinology

## 2022-01-12 VITALS — BP 122/72 | HR 67 | Ht 67.0 in | Wt 155.6 lb

## 2022-01-12 DIAGNOSIS — Z8639 Personal history of other endocrine, nutritional and metabolic disease: Secondary | ICD-10-CM | POA: Diagnosis not present

## 2022-01-12 DIAGNOSIS — E785 Hyperlipidemia, unspecified: Secondary | ICD-10-CM | POA: Diagnosis not present

## 2022-01-12 DIAGNOSIS — E1165 Type 2 diabetes mellitus with hyperglycemia: Secondary | ICD-10-CM

## 2022-01-12 NOTE — Patient Instructions (Addendum)
Check blood sugars on waking up 2-3 days a week  Also check blood sugars about 2 hours after meals and do this after different meals by rotation  Recommended blood sugar levels on waking up are 90-130 and about 2 hours after meal is 130-160  Please bring your blood sugar monitor to each visit, thank you  Walking 4/7 days  Low fat meals and protein at meals

## 2022-01-12 NOTE — Progress Notes (Signed)
Patient ID: Joe Boyd, male   DOB: 04/25/1976, 46 y.o.   MRN: [id number]           Reason for Appointment: Follow-up visit   History of Present Illness:          Diagnosis: Type 2 diabetes mellitus, date of diagnosis: 2012        Past history:  At time of diagnosis he was having symptoms of increased thirst and urination and he was seen by his physician when he started feeling weak and dizzy also. His A1c at diagnosis was 12.8. He thinks he was treated with metformin only and also he started changing his diet along with eliminating sweet soft drinks.  Initially was treated with only 500 mg of metformin ER and in 2014 this was increased to 1000 mg.  He has not had any other medications for diabetes. He was last seen by his endocrinologist in [place] in 07/2014 when his A1c was 6.5 and no changes were made A1c of over 7%  in 7/16 and he was started on Kombiglyze XR In 6/17 with his A1c going up to 7.6 he was given Glyset 25 mg at lunch also in addition to [employer]  Recent history:   A1c is slightly higher at 7.5, previously was at 7       Oral hypoglycemic drugs :  Acarbose 50 mg at breakfast and 100 mg at lunch , Januvia 100 mg daily, Amaryl 0.5 mg at bedtime  Current management, blood sugar patterns and problems identified: Patient has a blood sugars are out of control because of his eating frozen food, living alone in the last 6 weeks or so without family support and not exercising He does not check his blood sugars  Lab fasting glucose 122  He says he has been forgetting to take his acarbose at meals  Also his new job is more sedentary Weight is about the same      Side effects from medications have been: None   Glucose monitoring:  done occasionally,        Glucometer:  Contour      Blood Glucose readings: Not available  Self-care:  Meals:  1-2 meals per day.  Tries to get some protein with his lunch, sometimes yogurt, sometimes has chicken.    Usually avoiding  snacks                  Dietician visit, most recent: 7/16                Weight history:   Wt Readings from Last 3 Encounters:  01/12/22 155 lb 9.6 oz (70.6 kg)  10/14/21 153 lb (69.4 kg)  09/09/21 156 lb 3.2 oz (70.9 kg)    Glycemic control:   Lab Results  Component Value Date   HGBA1C 7.5 (H) 01/08/2022   HGBA1C 7.0 (H) 08/12/2021   HGBA1C 7.1 (H) 04/08/2021   Lab Results  Component Value Date   MICROALBUR 0.9 04/08/2021   LDLCALC 46 01/08/2022   CREATININE 1.20 01/08/2022   Lab on 01/08/2022  Component Date Value Ref Range Status   Cholesterol 01/08/2022 107  0 - 200 mg/dL Final   ATP III Classification       Desirable:  < 200 mg/dL               Borderline High:  200 - 239 mg/dL          High:  > = 240 mg/dL   Triglycerides  01/08/2022 106.0  0.0 - 149.0 mg/dL Final   Normal:  <150 mg/dLBorderline High:  150 - 199 mg/dL   HDL 01/08/2022 39.70  >39.00 mg/dL Final   VLDL 01/08/2022 21.2  0.0 - 40.0 mg/dL Final   LDL Cholesterol 01/08/2022 46  0 - 99 mg/dL Final   Total CHOL/HDL Ratio 01/08/2022 3   Final                  Men          Women1/2 Average Risk     3.4          3.3Average Risk          5.0          4.42X Average Risk          9.6          7.13X Average Risk          15.0          11.0                       NonHDL 01/08/2022 67.13   Final   NOTE:  Non-HDL goal should be 30 mg/dL higher than patient's LDL goal (i.e. LDL goal of < 70 mg/dL, would have non-HDL goal of < 100 mg/dL)   Sodium 01/08/2022 140  135 - 145 mEq/L Final   Potassium 01/08/2022 5.0  3.5 - 5.1 mEq/L Final   Chloride 01/08/2022 103  96 - 112 mEq/L Final   CO2 01/08/2022 31  19 - 32 mEq/L Final   Glucose, Bld 01/08/2022 122 (H)  70 - 99 mg/dL Final   BUN 01/08/2022 11  6 - 23 mg/dL Final   Creatinine, Ser 01/08/2022 1.20  0.40 - 1.50 mg/dL Final   Total Bilirubin 01/08/2022 0.7  0.2 - 1.2 mg/dL Final   Alkaline Phosphatase 01/08/2022 84  39 - 117 U/L Final   AST 01/08/2022 27  0 - 37 U/L  Final   ALT 01/08/2022 34  0 - 53 U/L Final   Total Protein 01/08/2022 7.6  6.0 - 8.3 g/dL Final   Albumin 01/08/2022 4.6  3.5 - 5.2 g/dL Final   GFR 01/08/2022 71.95  >60.00 mL/min Final   Calculated using the CKD-EPI Creatinine Equation (2021)   Calcium 01/08/2022 10.0  8.4 - 10.5 mg/dL Final   Hgb A1c MFr Bld 01/08/2022 7.5 (H)  4.6 - 6.5 % Final   Glycemic Control Guidelines for People with Diabetes:Non Diabetic:  <6%Goal of Therapy: <7%Additional Action Suggested:  >8%       Allergies as of 01/12/2022       Reactions   Augmentin [amoxicillin-pot Clavulanate] Itching   Diclofenac    Abnormal liver functions        Medication List        Accurate as of January 12, 2022  3:53 PM. If you have any questions, ask your nurse or doctor.          acarbose 50 MG tablet Commonly known as: PRECOSE Take 2 tablet by mouth daily at lunch.  May also take 1 tab at breakfast and dinner if eating   albuterol 108 (90 Base) MCG/ACT inhaler Commonly known as: VENTOLIN HFA Inhale 2 puffs into the lungs every 6 (six) hours as needed for wheezing or shortness of breath.   atorvastatin 10 MG tablet Commonly known as: LIPITOR Take 1 tablet (10 mg total) by mouth daily.   clotrimazole  1 % cream Commonly known as: LOTRIMIN Apply 1 application topically 2 (two) times daily.   fluconazole 150 MG tablet Commonly known as: DIFLUCAN Take 1 tablet today, may take another tablet in 5 days. Repeat again in 1 week if needed.   glimepiride 1 MG tablet Commonly known as: AMARYL TAKE 1 TABLET BY MOUTH ONCE A DAY AT BEDTIME   Januvia 100 MG tablet Generic drug: sitaGLIPtin TAKE 1 TABLET BY MOUTH ONCE A DAY   multivitamin with minerals Tabs tablet Take 1 tablet by mouth daily.   OneTouch Delica Lancets 16X Misc Use to check blood sugars daily-Dx code E11.65   OneTouch Verio Sync System w/Device Kit Use to check blood sugars daily-Dx code E11.65   OneTouch Verio test strip Generic  drug: glucose blood Use to check blood sugars daily-Dx code E11.65        Allergies:  Allergies  Allergen Reactions   Augmentin [Amoxicillin-Pot Clavulanate] Itching   Diclofenac     Abnormal liver functions    Past Medical History:  Diagnosis Date   Diabetes mellitus without complication ([place])    Tuberculosis    Finished Rx in 8/15    No past surgical history on file.  Family History  Problem Relation Age of Onset   Diabetes Mother    Hypertension Mother    Thyroid disease Mother    Heart disease Mother    Diabetes Father    Hypertension Father    Heart disease Father    Breast cancer Sister        56s   Heart disease Paternal Uncle    Cancer Neg Hx    Colon cancer Neg Hx     Social History:  reports that he has never smoked. He has never used smokeless tobacco. He reports current alcohol use. He reports that he does not use drugs.    Review of Systems        Lipids:  LDL and triglycerides have been variable Started on atorvastatin 10 mg daily in 4/21 because of high LDL and cardiovascular risk factors LDL is controlled although HDL stays low       Lab Results  Component Value Date   CHOL 107 01/08/2022   CHOL 90 04/08/2021   CHOL 101 08/19/2020   Lab Results  Component Value Date   HDL 39.70 01/08/2022   HDL 37.80 (L) 04/08/2021   HDL 37.30 (L) 08/19/2020   Lab Results  Component Value Date   LDLCALC 46 01/08/2022   LDLCALC 37 04/08/2021   LDLCALC 44 08/19/2020   Lab Results  Component Value Date   TRIG 106.0 01/08/2022   TRIG 78.0 04/08/2021   TRIG 100.0 08/19/2020   Lab Results  Component Value Date   CHOLHDL 3 01/08/2022   CHOLHDL 2 04/08/2021   CHOLHDL 3 08/19/2020   No results found for: LDLDIRECT                 Thyroid: History of a small goiter with normal TSH   Lab Results  Component Value Date   TSH 0.98 10/04/2019   No history of hypertension  BP Readings from Last 3 Encounters:  01/12/22 122/72  10/14/21  118/78  09/09/21 124/74    HYPERKALEMIA:  His potassium had been previously high at times without obvious cause It is high normal currently  Lab Results  Component Value Date   K 5.0 01/08/2022   Abnormal liver function: improved with stopping Voltaren previously taken for his shoulder pain  Lab Results  Component Value Date   ALT 34 01/08/2022   HYPERCALCEMIA: He appears to have had high normal or high calcium levels, no history of kidney stones or renal dysfunction Previous normal  He was told to stop his calcium supplement when calcium was high in 10/21 Calcium consistently upper normal  Lab Results  Component Value Date   CALCIUM 10.0 01/08/2022   CALCIUM 10.2 08/12/2021   CALCIUM 10.1 06/13/2021   CALCIUM 10.2 04/08/2021   CALCIUM 10.1 12/17/2020   CALCIUM 9.1 11/29/2020   Lab Results  Component Value Date   PTH 15 12/17/2020   CALCIUM 10.0 01/08/2022     LABS:  Lab on 01/08/2022  Component Date Value Ref Range Status   Cholesterol 01/08/2022 107  0 - 200 mg/dL Final   ATP III Classification       Desirable:  < 200 mg/dL               Borderline High:  200 - 239 mg/dL          High:  > = 240 mg/dL   Triglycerides 01/08/2022 106.0  0.0 - 149.0 mg/dL Final   Normal:  <150 mg/dLBorderline High:  150 - 199 mg/dL   HDL 01/08/2022 39.70  >39.00 mg/dL Final   VLDL 01/08/2022 21.2  0.0 - 40.0 mg/dL Final   LDL Cholesterol 01/08/2022 46  0 - 99 mg/dL Final   Total CHOL/HDL Ratio 01/08/2022 3   Final                  Men          Women1/2 Average Risk     3.4          3.3Average Risk          5.0          4.42X Average Risk          9.6          7.13X Average Risk          15.0          11.0                       NonHDL 01/08/2022 67.13   Final   NOTE:  Non-HDL goal should be 30 mg/dL higher than patient's LDL goal (i.e. LDL goal of < 70 mg/dL, would have non-HDL goal of < 100 mg/dL)   Sodium 01/08/2022 140  135 - 145 mEq/L Final   Potassium 01/08/2022 5.0  3.5 -  5.1 mEq/L Final   Chloride 01/08/2022 103  96 - 112 mEq/L Final   CO2 01/08/2022 31  19 - 32 mEq/L Final   Glucose, Bld 01/08/2022 122 (H)  70 - 99 mg/dL Final   BUN 01/08/2022 11  6 - 23 mg/dL Final   Creatinine, Ser 01/08/2022 1.20  0.40 - 1.50 mg/dL Final   Total Bilirubin 01/08/2022 0.7  0.2 - 1.2 mg/dL Final   Alkaline Phosphatase 01/08/2022 84  39 - 117 U/L Final   AST 01/08/2022 27  0 - 37 U/L Final   ALT 01/08/2022 34  0 - 53 U/L Final   Total Protein 01/08/2022 7.6  6.0 - 8.3 g/dL Final   Albumin 01/08/2022 4.6  3.5 - 5.2 g/dL Final   GFR 01/08/2022 71.95  >60.00 mL/min Final   Calculated using the CKD-EPI Creatinine Equation (2021)   Calcium 01/08/2022 10.0  8.4 - 10.5 mg/dL Final  Hgb A1c MFr Bld 01/08/2022 7.5 (H)  4.6 - 6.5 % Final   Glycemic Control Guidelines for People with Diabetes:Non Diabetic:  <6%Goal of Therapy: <7%Additional Action Suggested:  >8%     Physical Examination:  BP 122/72    Pulse 67    Ht '5\' 7"'  (1.702 m)    Wt 155 lb 9.6 oz (70.6 kg)    SpO2 98%    BMI 24.37 kg/m       ASSESSMENT:  Diabetes type 2 , nonobese See history of present illness for detailed discussion of current diabetes management, blood sugar patterns and problems identified  His A1c is 7.5 and higher  Blood sugars are poorly controlled from lifestyle changes as discussed above He is not monitoring and not very motivated to take care of his diabetes  Lipid management: His LDL is still well controlled HDL likely can improve with regular exercise  PLAN:   Continue current medication regimen He needs to be consistent with his Acarbose 50 mg at breakfast and 100 mg at lunchtime  Needs to restart blood sugar monitoring He will start walking on his treadmill at least every other day Consistent diet with some protein at each meal He will also try to cut back on his evening snacks  Continue atorvastatin unchanged Avoid high potassium foods as before   Elayne Snare 01/12/2022,  3:53 PM   Note: This office note was prepared with Dragon voice recognition system technology. Any transcriptional errors that result from this process are unintentional.

## 2022-03-03 ENCOUNTER — Encounter: Payer: Self-pay | Admitting: Endocrinology

## 2022-03-04 ENCOUNTER — Other Ambulatory Visit: Payer: Self-pay

## 2022-03-04 DIAGNOSIS — E7800 Pure hypercholesterolemia, unspecified: Secondary | ICD-10-CM

## 2022-03-04 DIAGNOSIS — E1165 Type 2 diabetes mellitus with hyperglycemia: Secondary | ICD-10-CM

## 2022-03-04 MED ORDER — SITAGLIPTIN PHOSPHATE 100 MG PO TABS: 100.0000 mg | ORAL_TABLET | Freq: Every day | ORAL | 3 refills | Status: DC

## 2022-03-04 MED ORDER — ACARBOSE 50 MG PO TABS: ORAL_TABLET | ORAL | 3 refills | Status: DC

## 2022-03-04 MED ORDER — ATORVASTATIN CALCIUM 10 MG PO TABS: 10.0000 mg | ORAL_TABLET | Freq: Every day | ORAL | 3 refills | Status: DC

## 2022-03-04 NOTE — Telephone Encounter (Signed)
Patient also called in about these refills. They were sent to [facility] and patient is aware  ?

## 2022-03-04 NOTE — Telephone Encounter (Signed)
Patient called in needing refill on atorvastatin, acarbose, and januvia sent to [facility]. Rx sent ?

## 2022-04-06 ENCOUNTER — Other Ambulatory Visit (INDEPENDENT_AMBULATORY_CARE_PROVIDER_SITE_OTHER): Payer: 59

## 2022-04-06 DIAGNOSIS — E1165 Type 2 diabetes mellitus with hyperglycemia: Secondary | ICD-10-CM | POA: Diagnosis not present

## 2022-04-06 LAB — BASIC METABOLIC PANEL
BUN: 16 mg/dL (ref 6–23)
CO2: 30 mEq/L (ref 19–32)
Calcium: 10.1 mg/dL (ref 8.4–10.5)
Chloride: 106 mEq/L (ref 96–112)
Creatinine, Ser: 1.11 mg/dL (ref 0.40–1.50)
GFR: 78.87 mL/min (ref >60.00)
Glucose, Bld: 137 mg/dL — ABNORMAL HIGH (ref 70–99)
Potassium: 4.7 mEq/L (ref 3.5–5.1)
Sodium: 144 mEq/L (ref 135–145)

## 2022-04-06 LAB — HEMOGLOBIN A1C: Hgb A1c MFr Bld: 7.7 % — ABNORMAL HIGH (ref 4.6–6.5)

## 2022-04-13 ENCOUNTER — Ambulatory Visit: Payer: 59 | Admitting: Endocrinology

## 2022-04-13 VITALS — BP 128/80 | HR 68 | Ht 67.0 in | Wt 158.6 lb

## 2022-04-13 DIAGNOSIS — E1165 Type 2 diabetes mellitus with hyperglycemia: Secondary | ICD-10-CM

## 2022-04-13 NOTE — Patient Instructions (Signed)
Walk DAILy ? ?Check blood sugars on waking up 2 days a week ? ?Also check blood sugars about 2 hours after meals and do this after different meals by rotation ? ?Recommended blood sugar levels on waking up are 90-130 and about 2 hours after meal is 130-160 ? ?Please bring your blood sugar monitor to each visit, thank you ? ? ?Glimeperide 1 tab at nite ?

## 2022-04-13 NOTE — Progress Notes (Signed)
Patient ID: Joe Boyd, male   DOB: 12-Feb-1976, 46 y.o.   MRN: [id number] ? ?       ? ? ?Reason for Appointment: Follow-up visit ? ? ?History of Present Illness:  ?        ?Diagnosis: Type 2 diabetes mellitus, date of diagnosis: 2012       ? ?Past history:  ?At time of diagnosis he was having symptoms of increased thirst and urination and he was seen by his physician when he started feeling weak and dizzy also. ?His A1c at diagnosis was 12.8. ?He thinks he was treated with metformin only and also he started changing his diet along with eliminating sweet soft drinks.  Initially was treated with only 500 mg of metformin ER and in 2014 this was increased to 1000 mg.  He has not had any other medications for diabetes. ?He was last seen by his endocrinologist in [place] in 07/2014 when his A1c was 6.5 and no changes were made ?A1c of over 7%  in 7/16 and he was started on Kombiglyze XR ?In 6/17 with his A1c going up to 7.6 he was given Glyset 25 mg at lunch also in addition to [place] ? ?Recent history:  ? ?A1c is progressively higher at 7.7, previously was at 7 ?      ?Oral hypoglycemic drugs :  Acarbose 50 mg at breakfast and 100 mg at lunch , Januvia 100 mg daily, Amaryl 0.5 mg at bedtime ? ?Current management, blood sugar patterns and problems identified: ?Although he was intending to start changing his lifestyle and exercising as well as watching his diet he still has not made many changes  ?Has checked his blood sugar only this morning and not regularly  ?As before he is sitting during the day without being active at work  ?Although his lab glucose was 137 his fasting at home 150 today ?He has not checked readings after meals  ?Medications were not changed on the last visit ?However he thinks he is trying to take his acarbose consistently at breakfast and lunch as directed, usually not eating a meal in the evening  ?He just had his Januvia refilled about a month ago ? ?    Side effects from medications have  been: None ? ? ?Glucose monitoring:  done occasionally,        Glucometer:  Contour     ? ?Blood Glucose readings: As above ? ?Self-care:  ?Meals:  1-2 meals per day.  ?Tries to get some protein with his lunch, sometimes yogurt, sometimes has chicken.    ?Usually avoiding snacks ?         ?        ?Dietician visit, most recent: 7/16               ? ?Weight history:  ? ?Wt Readings from Last 3 Encounters:  ?04/13/22 158 lb 9.6 oz (71.9 kg)  ?01/12/22 155 lb 9.6 oz (70.6 kg)  ?10/14/21 153 lb (69.4 kg)  ? ? ?Glycemic control: ? ? ?Lab Results  ?Component Value Date  ? HGBA1C 7.7 (H) 04/06/2022  ? HGBA1C 7.5 (H) 01/08/2022  ? HGBA1C 7.0 (H) 08/12/2021  ? ?Lab Results  ?Component Value Date  ? MICROALBUR 0.9 04/08/2021  ? LDLCALC 46 01/08/2022  ? CREATININE 1.11 04/06/2022  ? ?No visits with results within 1 Week(s) from this visit.  ?Latest known visit with results is:  ?Lab on 04/06/2022  ?Component Date Value Ref Range Status  ?  Sodium 04/06/2022 144  135 - 145 mEq/L Final  ? Potassium 04/06/2022 4.7  3.5 - 5.1 mEq/L Final  ? Chloride 04/06/2022 106  96 - 112 mEq/L Final  ? CO2 04/06/2022 30  19 - 32 mEq/L Final  ? Glucose, Bld 04/06/2022 137 (H)  70 - 99 mg/dL Final  ? BUN 04/06/2022 16  6 - 23 mg/dL Final  ? Creatinine, Ser 04/06/2022 1.11  0.40 - 1.50 mg/dL Final  ? GFR 04/06/2022 78.87  >60.00 mL/min Final  ? Calculated using the CKD-EPI Creatinine Equation (2021)  ? Calcium 04/06/2022 10.1  8.4 - 10.5 mg/dL Final  ? Hgb A1c MFr Bld 04/06/2022 7.7 (H)  4.6 - 6.5 % Final  ? Glycemic Control Guidelines for People with Diabetes:Non Diabetic:  <6%Goal of Therapy: <7%Additional Action Suggested:  >8%   ? ? ? ? ?Allergies as of 04/13/2022   ? ?   Reactions  ? Augmentin [amoxicillin-pot Clavulanate] Itching  ? Diclofenac   ? Abnormal liver functions  ? ?  ? ?  ?Medication List  ?  ? ?  ? Accurate as of April 13, 2022  4:26 PM. If you have any questions, ask your nurse or doctor.  ?  ?  ? ?  ? ?acarbose 50 MG  tablet ?Commonly known as: PRECOSE ?Take 2 tablet by mouth daily at lunch.  May also take 1 tab at breakfast and dinner if eating ?  ?albuterol 108 (90 Base) MCG/ACT inhaler ?Commonly known as: VENTOLIN HFA ?Inhale 2 puffs into the lungs every 6 (six) hours as needed for wheezing or shortness of breath. ?  ?atorvastatin 10 MG tablet ?Commonly known as: LIPITOR ?Take 1 tablet (10 mg total) by mouth daily. ?  ?clotrimazole 1 % cream ?Commonly known as: LOTRIMIN ?Apply 1 application topically 2 (two) times daily. ?  ?fluconazole 150 MG tablet ?Commonly known as: DIFLUCAN ?Take 1 tablet today, may take another tablet in 5 days. Repeat again in 1 week if needed. ?  ?glimepiride 1 MG tablet ?Commonly known as: AMARYL ?TAKE 1 TABLET BY MOUTH ONCE A DAY AT BEDTIME ?  ?multivitamin with minerals Tabs tablet ?Take 1 tablet by mouth daily. ?  ?OneTouch Delica Lancets 56Y Misc ?Use to check blood sugars daily-Dx code E11.65 ?  ?[place] w/Device Kit ?Use to check blood sugars daily-Dx code E11.65 ?  ?OneTouch Verio test strip ?Generic drug: glucose blood ?Use to check blood sugars daily-Dx code E11.65 ?  ?sitaGLIPtin 100 MG tablet ?Commonly known as: Januvia ?Take 1 tablet (100 mg total) by mouth daily. ?  ? ?  ? ? ?Allergies:  ?Allergies  ?Allergen Reactions  ? Augmentin [Amoxicillin-Pot Clavulanate] Itching  ? Diclofenac   ?  Abnormal liver functions  ? ? ?Past Medical History:  ?Diagnosis Date  ? Diabetes mellitus without complication ([place])   ? Tuberculosis   ? Finished Rx in 8/15  ? ? ?No past surgical history on file. ? ?Family History  ?Problem Relation Age of Onset  ? Diabetes Mother   ? Hypertension Mother   ? Thyroid disease Mother   ? Heart disease Mother   ? Diabetes Father   ? Hypertension Father   ? Heart disease Father   ? Breast cancer Sister   ?     35s  ? Heart disease Paternal Uncle   ? Cancer Neg Hx   ? Colon cancer Neg Hx   ? ? ?Social History:  reports that he has never smoked.  He has  never used smokeless tobacco. He reports current alcohol use. He reports that he does not use drugs. ? ?  ?Review of Systems  ? ? ?    Lipids:  LDL and triglycerides have been variable ?Started on atorvastatin 10 mg daily in 4/21 because of high LDL and cardiovascular risk factors ?LDL is controlled although HDL stays low ? ?     ?Lab Results  ?Component Value Date  ? CHOL 107 01/08/2022  ? CHOL 90 04/08/2021  ? CHOL 101 08/19/2020  ? ?Lab Results  ?Component Value Date  ? HDL 39.70 01/08/2022  ? HDL 37.80 (L) 04/08/2021  ? HDL 37.30 (L) 08/19/2020  ? ?Lab Results  ?Component Value Date  ? LDLCALC 46 01/08/2022  ? LDLCALC 37 04/08/2021  ? [place] 44 08/19/2020  ? ?Lab Results  ?Component Value Date  ? TRIG 106.0 01/08/2022  ? TRIG 78.0 04/08/2021  ? TRIG 100.0 08/19/2020  ? ?Lab Results  ?Component Value Date  ? CHOLHDL 3 01/08/2022  ? CHOLHDL 2 04/08/2021  ? CHOLHDL 3 08/19/2020  ? ?No results found for: LDLDIRECT ?            ? ?   Thyroid: History of a small goiter with normal TSH ? ? ?Lab Results  ?Component Value Date  ? TSH 0.98 10/04/2019  ? ?No history of hypertension ? ?BP Readings from Last 3 Encounters:  ?04/13/22 140/86  ?01/12/22 122/72  ?10/14/21 118/78  ? ? ?HYPERKALEMIA: ? ?His potassium had been previously high at times without obvious cause ? ? ?Lab Results  ?Component Value Date  ? K 4.7 04/06/2022  ? ?Abnormal liver function: improved with stopping Voltaren previously taken for his shoulder pain ? ? ?Lab Results  ?Component Value Date  ? ALT 34 01/08/2022  ? ?HYPERCALCEMIA: He appears to have had high normal or high calcium levels, no history of kidney stones or renal dysfunction ?Previous normal ? ?He was told to stop his calcium supplement when calcium was high in 10/21 ?Calcium consistently upper normal ? ?Lab Results  ?Component Value Date  ? CALCIUM 10.1 04/06/2022  ? CALCIUM 10.0 01/08/2022  ? CALCIUM 10.2 08/12/2021  ? CALCIUM 10.1 06/13/2021  ? CALCIUM 10.2 04/08/2021  ? CALCIUM 10.1  12/17/2020  ? ?Lab Results  ?Component Value Date  ? PTH 15 12/17/2020  ? CALCIUM 10.1 04/06/2022  ? ? ? ?LABS: ? ?No visits with results within 1 Week(s) from this visit.  ?Latest known visit with results is:  ?Lab on 0

## 2022-04-20 ENCOUNTER — Encounter: Payer: Self-pay | Admitting: Endocrinology

## 2022-04-20 ENCOUNTER — Encounter: Payer: Self-pay | Admitting: Physician Assistant

## 2022-05-12 ENCOUNTER — Telehealth: Payer: Self-pay | Admitting: Physician Assistant

## 2022-05-12 ENCOUNTER — Ambulatory Visit (INDEPENDENT_AMBULATORY_CARE_PROVIDER_SITE_OTHER): Payer: 59 | Admitting: Physician Assistant

## 2022-05-12 ENCOUNTER — Encounter: Payer: Self-pay | Admitting: Physician Assistant

## 2022-05-12 ENCOUNTER — Ambulatory Visit (INDEPENDENT_AMBULATORY_CARE_PROVIDER_SITE_OTHER)
Admission: RE | Admit: 2022-05-12 | Discharge: 2022-05-12 | Disposition: A | Discharge status: Home / Self Care | Payer: 59 | Source: Ambulatory Visit | Attending: Physician Assistant | Admitting: Physician Assistant

## 2022-05-12 VITALS — BP 106/70 | HR 86 | Temp 98.1°F | Ht 67.0 in | Wt 158.2 lb

## 2022-05-12 DIAGNOSIS — R051 Acute cough: Secondary | ICD-10-CM

## 2022-05-12 DIAGNOSIS — R140 Abdominal distension (gaseous): Secondary | ICD-10-CM | POA: Diagnosis not present

## 2022-05-12 LAB — POCT RAPID STREP A (OFFICE): Rapid Strep A Screen: NEGATIVE

## 2022-05-12 MED ORDER — DOXYCYCLINE HYCLATE 100 MG PO TABS: 100.0000 mg | ORAL_TABLET | Freq: Two times a day (BID) | ORAL | 0 refills | Status: DC

## 2022-05-12 NOTE — Patient Instructions (Signed)
It was great to see you! ? ?Start doxycyline for your  cough ? ?Start a daily probiotic, such as [employer] or [employer]ive [employer]age, yogurt is fine too ?If gas or abdominal pain worsens or dose not improve, let me know ? ?An order for an xray has been put in for you. ?To get your xray, you can walk in at the [facility] location without a scheduled appointment.  ?The address is [address]. It is across the street from [facility]. ?X-ray is located in the basement.  ?Hours of operation are M-F 8:30am to 5:00pm. Please note that they are closed for lunch between 12:30 and 1:00pm. ? ?Take care, ? ?Inda Coke [name]  ?

## 2022-05-12 NOTE — Telephone Encounter (Signed)
Pt states he was expecting to have the prescription below sent to the Pharmacy. ? ?Please call with any questions. ? ?.. ?Encourage patient to contact the pharmacy for refills or they can request refills through [facility] ? ?LAST APPOINTMENT DATE:  05/12/22 ? ?NEXT APPOINTMENT DATE: n/a ? ?MEDICATION:  ?albuterol (VENTOLIN HFA) 108 (90 Base) MCG/ACT inhaler [id number]  ? ?Is the patient out of medication?  ?Yes ? ? ?PHARMACY: ?[facility] [address].  ?[address] [place] [address]  ?Phone:  [phone]  Fax:  [phone] ? ?Let patient know to contact pharmacy at the end of the day to make sure medication is ready. ? ?Please notify patient to allow 48-72 hours to process  ?

## 2022-05-12 NOTE — Progress Notes (Signed)
Joe Boyd is a 46 y.o. male here for a follow up of a pre-existing problem. ? ?History of Present Illness:  ? ?Chief Complaint  ?Patient presents with  ? Cough  ?  Pt c/o cough off and on since COVID 4/24. Cough has increased since last Friday 5/12. Coughing and expectorating yellow sputum. Chills off and on but no fever. He says hurts in chest when coughing.  ? ? ?HPI ? ?Cough ?Patient here with cough that has been onset for the past 5 days ago. He reports he had Covid on 04/20/2022. He took paxlovid as prescribed. Has had cough but this was resolved. However, he notes his symptoms came back. His associated symptoms include runny nose headaches and body aches. He has been coughing up yellow sputum. He has noticed his chest hurt whenever he cough. Has tried albuterol inhaler about 4 pumps today with no relief. He has not tried any other specific treatment. He is having some intermittent SOB and feels tightness in chest. Denies fever or chills. Denies any sick contacts. No ear pain. No sore throat.  ? ?Increased gas ?Patient reports increase in gas x 5 days. Denies: n/v/d/c, rectal bleeding, GERD, abdominal pain. ? ?Past Medical History:  ?Diagnosis Date  ? Diabetes mellitus without complication ([place])   ? Tuberculosis   ? Finished Rx in 8/15  ? ?  ?Social History  ? ?Tobacco Use  ? Smoking status: Never  ? Smokeless tobacco: Never  ?Vaping Use  ? Vaping Use: Never used  ?Substance Use Topics  ? Alcohol use: Yes  ?  Comment: occasionally  ? Drug use: No  ? ? ?History reviewed. No pertinent surgical history. ? ?Family History  ?Problem Relation Age of Onset  ? Diabetes Mother   ? Hypertension Mother   ? Thyroid disease Mother   ? Heart disease Mother   ? Diabetes Father   ? Hypertension Father   ? Heart disease Father   ? Breast cancer Sister   ?     58s  ? Heart disease Paternal Uncle   ? Cancer Neg Hx   ? Colon cancer Neg Hx   ? ? ?Allergies  ?Allergen Reactions  ? Augmentin [Amoxicillin-Pot Clavulanate]  Itching  ? Diclofenac   ?  Abnormal liver functions  ? ? ?Current Medications:  ? ?Current Outpatient Medications:  ?  acarbose (PRECOSE) 50 MG tablet, Take 2 tablet by mouth daily at lunch.  May also take 1 tab at breakfast and dinner if eating, Disp: 270 tablet, Rfl: 3 ?  albuterol (VENTOLIN HFA) 108 (90 Base) MCG/ACT inhaler, Inhale 2 puffs into the lungs every 6 (six) hours as needed for wheezing or shortness of breath., Disp: , Rfl:  ?  atorvastatin (LIPITOR) 10 MG tablet, Take 1 tablet (10 mg total) by mouth daily., Disp: 90 tablet, Rfl: 3 ?  Blood Glucose Monitoring Suppl ([place]) w/Device KIT, Use to check blood sugars daily-Dx code E11.65, Disp: 1 kit, Rfl: 1 ?  clotrimazole (LOTRIMIN) 1 % cream, Apply 1 application topically 2 (two) times daily., Disp: 30 g, Rfl: 0 ?  doxycycline (VIBRA-TABS) 100 MG tablet, Take 1 tablet (100 mg total) by mouth 2 (two) times daily., Disp: 14 tablet, Rfl: 0 ?  glimepiride (AMARYL) 1 MG tablet, TAKE 1 TABLET BY MOUTH ONCE A DAY AT BEDTIME, Disp: 90 tablet, Rfl: 1 ?  glucose blood (ONETOUCH VERIO) test strip, Use to check blood sugars daily-Dx code E11.65, Disp: 100 each, Rfl: 1 ?  Multiple Vitamin (MULTIVITAMIN WITH MINERALS) TABS tablet, Take 1 tablet by mouth daily. , Disp: , Rfl:  ?  OneTouch Delica Lancets 16X MISC, Use to check blood sugars daily-Dx code E11.65, Disp: 100 each, Rfl: 1 ?  sitaGLIPtin (JANUVIA) 100 MG tablet, Take 1 tablet (100 mg total) by mouth daily., Disp: 90 tablet, Rfl: 3  ? ?Review of Systems:  ? ?ROS ?Negative unless otherwise specified per HPI.  ? ?Vitals:  ? ?Vitals:  ? 05/12/22 1531  ?BP: 106/70  ?Pulse: 86  ?Temp: 98.1 ?F (36.7 ?C)  ?TempSrc: Temporal  ?SpO2: 95%  ?Weight: 158 lb 4 oz (71.8 kg)  ?Height: '5\' 7"'  (1.702 m)  ?   ?Body mass index is 24.79 kg/m?. ? ?Physical Exam:  ? ?Physical Exam ?Vitals and nursing note reviewed.  ?Constitutional:   ?   General: He is not in acute distress. ?   Appearance: He is  well-developed. He is not ill-appearing or toxic-appearing.  ?Cardiovascular:  ?   Rate and Rhythm: Normal rate and regular rhythm.  ?   Pulses: Normal pulses.  ?   Heart sounds: Normal heart sounds, S1 normal and S2 normal.  ?Pulmonary:  ?   Effort: Pulmonary effort is normal.  ?   Breath sounds: Normal breath sounds.  ?Abdominal:  ?   General: Abdomen is flat. Bowel sounds are normal.  ?   Palpations: Abdomen is soft.  ?   Tenderness: There is abdominal tenderness in the left lower quadrant.  ?   Comments: Very mild LLQ pain  ?Skin: ?   General: Skin is warm and dry.  ?Neurological:  ?   Mental Status: He is alert.  ?   GCS: GCS eye subscore is 4. GCS verbal subscore is 5. GCS motor subscore is 6.  ?Psychiatric:     ?   Speech: Speech normal.     ?   Behavior: Behavior normal. Behavior is cooperative.  ? ?Results for orders placed or performed in visit on 05/12/22  ?POCT rapid strep A  ?Result Value Ref Range  ? Rapid Strep A Screen Negative Negative  ? ? ? ?Assessment and Plan:  ? ?Acute cough ?No red flags ?Strep test negative ?Suspect sinusitis. Will initiate doxycycline per orders. Discussed taking medications as prescribed. Reviewed return precautions including worsening fever, SOB, worsening cough or other concerns. Push fluids and rest. I recommend that patient follow-up if symptoms worsen or persist despite treatment x 7-10 days, sooner if needed. ? ?Patient is requesting a chest xray -- this has been ordered. ? ? ?Gassiness ?No red flags ?Mild abdominal pain present, but no acute abdomen ?Recommend hydration, fluids, probiotic ?If new/worsening symptoms, follow-up for further evaluation/mgmt ? ? ?I,Savera Zaman,acting as a scribe for [employer], PA.,have documented all relevant documentation on the behalf of Inda Coke, PA,as directed by  Inda Coke, PA while in the presence of Inda Coke, [place].  ? ?IInda Coke, PA, have reviewed all documentation for this visit. The documentation  on 05/12/22 for the exam, diagnosis, procedures, and orders are all accurate and complete. ? ? ?Inda Coke, [name] ? ?

## 2022-05-13 MED ORDER — ALBUTEROL SULFATE HFA 108 (90 BASE) MCG/ACT IN AERS: 2.0000 | INHALATION_SPRAY | Freq: Four times a day (QID) | RESPIRATORY_TRACT | 2 refills | Status: DC | PRN

## 2022-05-13 NOTE — Telephone Encounter (Signed)
Spoke to pt told him Rx for Albuterol was sent to pharmacy. Pt verbalized understanding. ?

## 2022-05-28 ENCOUNTER — Encounter: Payer: Self-pay | Admitting: Physician Assistant

## 2022-06-16 ENCOUNTER — Other Ambulatory Visit: Payer: Self-pay

## 2022-06-16 DIAGNOSIS — E1165 Type 2 diabetes mellitus with hyperglycemia: Secondary | ICD-10-CM

## 2022-06-16 MED ORDER — GLIMEPIRIDE 1 MG PO TABS: ORAL_TABLET | ORAL | 1 refills | Status: DC

## 2022-07-13 ENCOUNTER — Other Ambulatory Visit (INDEPENDENT_AMBULATORY_CARE_PROVIDER_SITE_OTHER): Payer: 59

## 2022-07-13 DIAGNOSIS — E1165 Type 2 diabetes mellitus with hyperglycemia: Secondary | ICD-10-CM | POA: Diagnosis not present

## 2022-07-13 LAB — COMPREHENSIVE METABOLIC PANEL
ALT: 27 U/L (ref 0–53)
AST: 24 U/L (ref 0–37)
Albumin: 4.6 g/dL (ref 3.5–5.2)
Alkaline Phosphatase: 69 U/L (ref 39–117)
BUN: 17 mg/dL (ref 6–23)
CO2: 31 mEq/L (ref 19–32)
Calcium: 10 mg/dL (ref 8.4–10.5)
Chloride: 105 mEq/L (ref 96–112)
Creatinine, Ser: 1.17 mg/dL (ref 0.40–1.50)
GFR: 73.9 mL/min (ref >60.00)
Glucose, Bld: 115 mg/dL — ABNORMAL HIGH (ref 70–99)
Potassium: 4.7 mEq/L (ref 3.5–5.1)
Sodium: 144 mEq/L (ref 135–145)
Total Bilirubin: 0.6 mg/dL (ref 0.2–1.2)
Total Protein: 7.2 g/dL (ref 6.0–8.3)

## 2022-07-13 LAB — HEMOGLOBIN A1C: Hgb A1c MFr Bld: 7.1 % — ABNORMAL HIGH (ref 4.6–6.5)

## 2022-07-13 LAB — MICROALBUMIN / CREATININE URINE RATIO
Creatinine,U: 228.7 mg/dL
Microalb Creat Ratio: 0.4 mg/g (ref 0.0–30.0)
Microalb, Ur: 0.9 mg/dL (ref 0.0–1.9)

## 2022-07-16 ENCOUNTER — Ambulatory Visit (INDEPENDENT_AMBULATORY_CARE_PROVIDER_SITE_OTHER): Payer: 59 | Admitting: Endocrinology

## 2022-07-16 ENCOUNTER — Encounter: Payer: Self-pay | Admitting: Endocrinology

## 2022-07-16 VITALS — BP 118/82 | HR 62 | Ht 67.0 in | Wt 152.0 lb

## 2022-07-16 DIAGNOSIS — E1165 Type 2 diabetes mellitus with hyperglycemia: Secondary | ICD-10-CM

## 2022-07-16 DIAGNOSIS — E785 Hyperlipidemia, unspecified: Secondary | ICD-10-CM

## 2022-07-16 NOTE — Progress Notes (Signed)
Patient ID: Joe Boyd, male   DOB: 08-31-1976, 46 y.o.   MRN: [id number]           Reason for Appointment: Follow-up visit   History of Present Illness:          Diagnosis: Type 2 diabetes mellitus, date of diagnosis: 2012        Past history:  At time of diagnosis he was having symptoms of increased thirst and urination and he was seen by his physician when he started feeling weak and dizzy also. His A1c at diagnosis was 12.8. He thinks he was treated with metformin only and also he started changing his diet along with eliminating sweet soft drinks.  Initially was treated with only 500 mg of metformin ER and in 2014 this was increased to 1000 mg.  He has not had any other medications for diabetes. He was last seen by his endocrinologist in [place] in 07/2014 when his A1c was 6.5 and no changes were made A1c of over 7%  in 7/16 and he was started on Kombiglyze XR In 6/17 with his A1c going up to 7.6 he was given Glyset 25 mg at lunch also in addition to [employer]  Recent history:   A1c is back down to 7.1 compared to 7.7       Oral hypoglycemic drugs :  Acarbose 50 mg at breakfast and 100 mg at lunch , Januvia 100 mg daily, Amaryl 1 mg at bedtime  Current management, blood sugar patterns and problems identified: Although his blood sugars were relatively higher after his last visit in April he has in the last 4 to 6 weeks change his diet significantly He has generally stopped eating carbohydrates at lunchtime and eating mostly salads with grilled chicken At dinnertime he is mostly eating some mixed nuts and not any proper meal However at breakfast he is eating usually wheat toast without any protein, only on weekends he will have an omelette with this Blood sugars are better and he has lost weight Also his glimepiride was increased from half tablet to 1 tablet at bedtime However monitring is still inadequate and has been for a bringing his blood sugar readings for review As  before he is sitting during the day without being active at work  his lab glucose was 115 and he thinks his morning sugars are about the same Likely not checking readings after meals are much and he thinks they are usually around 135 As before he did not bring his monitor Because of various issues he is not doing much exercise and only walking a couple of times a week       Side effects from medications have been: None   Glucose monitoring:  done occasionally,        Glucometer:  Contour      Blood Glucose readings by recall:   Am 115, pc 135  Self-care:  Meals:  1-2 meals per day.  Tries to get some protein with his lunch, sometimes yogurt, sometimes has chicken.    Usually avoiding snacks               Dietician visit, most recent: 7/16                Weight history:   Wt Readings from Last 3 Encounters:  07/16/22 152 lb (68.9 kg)  05/12/22 158 lb 4 oz (71.8 kg)  04/13/22 158 lb 9.6 oz (71.9 kg)    Glycemic control:   Lab Results  Component Value Date   HGBA1C 7.1 (H) 07/13/2022   HGBA1C 7.7 (H) 04/06/2022   HGBA1C 7.5 (H) 01/08/2022   Lab Results  Component Value Date   MICROALBUR 0.9 07/13/2022   LDLCALC 46 01/08/2022   CREATININE 1.17 07/13/2022   Lab on 07/13/2022  Component Date Value Ref Range Status   Microalb, Ur 07/13/2022 0.9  0.0 - 1.9 mg/dL Final   Creatinine,U 07/13/2022 228.7  mg/dL Final   Microalb Creat Ratio 07/13/2022 0.4  0.0 - 30.0 mg/g Final   Sodium 07/13/2022 144  135 - 145 mEq/L Final   Potassium 07/13/2022 4.7  3.5 - 5.1 mEq/L Final   Chloride 07/13/2022 105  96 - 112 mEq/L Final   CO2 07/13/2022 31  19 - 32 mEq/L Final   Glucose, Bld 07/13/2022 115 (H)  70 - 99 mg/dL Final   BUN 07/13/2022 17  6 - 23 mg/dL Final   Creatinine, Ser 07/13/2022 1.17  0.40 - 1.50 mg/dL Final   Total Bilirubin 07/13/2022 0.6  0.2 - 1.2 mg/dL Final   Alkaline Phosphatase 07/13/2022 69  39 - 117 U/L Final   AST 07/13/2022 24  0 - 37 U/L Final   ALT  07/13/2022 27  0 - 53 U/L Final   Total Protein 07/13/2022 7.2  6.0 - 8.3 g/dL Final   Albumin 07/13/2022 4.6  3.5 - 5.2 g/dL Final   GFR 07/13/2022 73.90  >60.00 mL/min Final   Calculated using the CKD-EPI Creatinine Equation (2021)   Calcium 07/13/2022 10.0  8.4 - 10.5 mg/dL Final   Hgb A1c MFr Bld 07/13/2022 7.1 (H)  4.6 - 6.5 % Final   Glycemic Control Guidelines for People with Diabetes:Non Diabetic:  <6%Goal of Therapy: <7%Additional Action Suggested:  >8%       Allergies as of 07/16/2022       Reactions   Augmentin [amoxicillin-pot Clavulanate] Itching   Diclofenac    Abnormal liver functions        Medication List        Accurate as of July 16, 2022  4:06 PM. If you have any questions, ask your nurse or doctor.          acarbose 50 MG tablet Commonly known as: PRECOSE Take 2 tablet by mouth daily at lunch.  May also take 1 tab at breakfast and dinner if eating   albuterol 108 (90 Base) MCG/ACT inhaler Commonly known as: VENTOLIN HFA Inhale 2 puffs into the lungs every 6 (six) hours as needed for wheezing or shortness of breath.   atorvastatin 10 MG tablet Commonly known as: LIPITOR Take 1 tablet (10 mg total) by mouth daily.   clotrimazole 1 % cream Commonly known as: LOTRIMIN Apply 1 application topically 2 (two) times daily.   doxycycline 100 MG tablet Commonly known as: VIBRA-TABS Take 1 tablet (100 mg total) by mouth 2 (two) times daily.   glimepiride 1 MG tablet Commonly known as: AMARYL Take 1 tablet by mouth once a day at bedtime   multivitamin with minerals Tabs tablet Take 1 tablet by mouth daily.   OneTouch Delica Lancets 33A Misc Use to check blood sugars daily-Dx code E11.65   OneTouch Verio Sync System w/Device Kit Use to check blood sugars daily-Dx code E11.65   OneTouch Verio test strip Generic drug: glucose blood Use to check blood sugars daily-Dx code E11.65   sitaGLIPtin 100 MG tablet Commonly known as: Januvia Take 1  tablet (100 mg total) by mouth daily.  Allergies:  Allergies  Allergen Reactions   Augmentin [Amoxicillin-Pot Clavulanate] Itching   Diclofenac     Abnormal liver functions    Past Medical History:  Diagnosis Date   Diabetes mellitus without complication ([place])    Tuberculosis    Finished Rx in 8/15    No past surgical history on file.  Family History  Problem Relation Age of Onset   Diabetes Mother    Hypertension Mother    Thyroid disease Mother    Heart disease Mother    Diabetes Father    Hypertension Father    Heart disease Father    Breast cancer Sister        72s   Heart disease Paternal Uncle    Cancer Neg Hx    Colon cancer Neg Hx     Social History:  reports that he has never smoked. He has never used smokeless tobacco. He reports current alcohol use. He reports that he does not use drugs.    Review of Systems        Lipids:  LDL and triglycerides have been variable Started on atorvastatin 10 mg daily in 4/21 because of high LDL and cardiovascular risk factors LDL is controlled although HDL stays low       Lab Results  Component Value Date   CHOL 107 01/08/2022   CHOL 90 04/08/2021   CHOL 101 08/19/2020   Lab Results  Component Value Date   HDL 39.70 01/08/2022   HDL 37.80 (L) 04/08/2021   HDL 37.30 (L) 08/19/2020   Lab Results  Component Value Date   LDLCALC 46 01/08/2022   LDLCALC 37 04/08/2021   LDLCALC 44 08/19/2020   Lab Results  Component Value Date   TRIG 106.0 01/08/2022   TRIG 78.0 04/08/2021   TRIG 100.0 08/19/2020   Lab Results  Component Value Date   CHOLHDL 3 01/08/2022   CHOLHDL 2 04/08/2021   CHOLHDL 3 08/19/2020   No results found for: "LDLDIRECT"                 Thyroid: History of a small goiter with normal TSH   Lab Results  Component Value Date   TSH 0.98 10/04/2019   No history of hypertension  BP Readings from Last 3 Encounters:  07/16/22 118/82  05/12/22 106/70  04/13/22 128/80     HYPERKALEMIA:  His potassium had been previously high at times without obvious cause   Lab Results  Component Value Date   K 4.7 07/13/2022    HYPERCALCEMIA: He appears to have had high normal or high calcium levels, no history of kidney stones or renal dysfunction Previous normal  He was told to stop his calcium supplement when calcium was high in 10/21 Calcium consistently upper normal  Lab Results  Component Value Date   CALCIUM 10.0 07/13/2022   CALCIUM 10.1 04/06/2022   CALCIUM 10.0 01/08/2022   CALCIUM 10.2 08/12/2021   CALCIUM 10.1 06/13/2021   CALCIUM 10.2 04/08/2021   Lab Results  Component Value Date   PTH 15 12/17/2020   CALCIUM 10.0 07/13/2022   No symptoms of neuropathy in his feet  LABS:  Lab on 07/13/2022  Component Date Value Ref Range Status   Microalb, Ur 07/13/2022 0.9  0.0 - 1.9 mg/dL Final   Creatinine,U 07/13/2022 228.7  mg/dL Final   Microalb Creat Ratio 07/13/2022 0.4  0.0 - 30.0 mg/g Final   Sodium 07/13/2022 144  135 - 145 mEq/L Final   Potassium 07/13/2022  4.7  3.5 - 5.1 mEq/L Final   Chloride 07/13/2022 105  96 - 112 mEq/L Final   CO2 07/13/2022 31  19 - 32 mEq/L Final   Glucose, Bld 07/13/2022 115 (H)  70 - 99 mg/dL Final   BUN 07/13/2022 17  6 - 23 mg/dL Final   Creatinine, Ser 07/13/2022 1.17  0.40 - 1.50 mg/dL Final   Total Bilirubin 07/13/2022 0.6  0.2 - 1.2 mg/dL Final   Alkaline Phosphatase 07/13/2022 69  39 - 117 U/L Final   AST 07/13/2022 24  0 - 37 U/L Final   ALT 07/13/2022 27  0 - 53 U/L Final   Total Protein 07/13/2022 7.2  6.0 - 8.3 g/dL Final   Albumin 07/13/2022 4.6  3.5 - 5.2 g/dL Final   GFR 07/13/2022 73.90  >60.00 mL/min Final   Calculated using the CKD-EPI Creatinine Equation (2021)   Calcium 07/13/2022 10.0  8.4 - 10.5 mg/dL Final   Hgb A1c MFr Bld 07/13/2022 7.1 (H)  4.6 - 6.5 % Final   Glycemic Control Guidelines for People with Diabetes:Non Diabetic:  <6%Goal of Therapy: <7%Additional Action Suggested:   >8%     Physical Examination:  BP 118/82 (BP Location: Left Arm, Patient Position: Sitting, Cuff Size: Normal)   Pulse 62   Ht 5' 7" (1.702 m)   Wt 152 lb (68.9 kg)   SpO2 99%   BMI 23.81 kg/m    Diabetic Foot Exam - Simple   Simple Foot Form Diabetic Foot exam was performed with the following findings: Yes   Visual Inspection No deformities, no ulcerations, no other skin breakdown bilaterally: Yes Sensation Testing Intact to touch and monofilament testing bilaterally: Yes Pulse Check Posterior Tibialis and Dorsalis pulse intact bilaterally: Yes Comments       ASSESSMENT: 7/23  Diabetes type 2 , nonobese See history of present illness for detailed discussion of current diabetes management, blood sugar patterns and problems identified  His A1c is 7.1  Blood sugars are better and he has lost weight This is from changing his diet especially at lunchtime However monitoring is still inadequate and has been for a bringing his blood sugar readings for review  PLAN:   Continue to take Amaryl 1 tablet at night This may need to be reduced if he has any tendency to low sugars Add a protein like peanut butter at breakfast or boiled egg because he is just eating a slice of toast normally  Start checking blood sugars regularly after meals Regular exercise with walking or treadmill or any exercise videos at home Again if blood sugars start going up may consider an SGLT2 drug instead of Januvia  There are no Patient Instructions on file for this visit.  Elayne Snare 07/16/2022, 4:06 PM   Note: This office note was prepared with Dragon voice recognition system technology. Any transcriptional errors that result from this process are unintentional.

## 2022-07-16 NOTE — Patient Instructions (Addendum)
Add protein at Bfst  Check blood sugars on waking up 2 days a week  Also check blood sugars about 2 hours after meals and do this after different meals by rotation  Recommended blood sugar levels on waking up are 90-130 and about 2 hours after meal is 130-160  Please bring your blood sugar monitor to each visit, thank you

## 2022-09-21 ENCOUNTER — Encounter: Payer: Self-pay | Admitting: *Deleted

## 2022-11-04 LAB — HM DIABETES EYE EXAM

## 2022-11-05 ENCOUNTER — Encounter: Payer: Self-pay | Admitting: Endocrinology

## 2022-11-23 ENCOUNTER — Other Ambulatory Visit (INDEPENDENT_AMBULATORY_CARE_PROVIDER_SITE_OTHER): Payer: Commercial Managed Care - HMO

## 2022-11-23 DIAGNOSIS — E1165 Type 2 diabetes mellitus with hyperglycemia: Secondary | ICD-10-CM | POA: Diagnosis not present

## 2022-11-23 DIAGNOSIS — E785 Hyperlipidemia, unspecified: Secondary | ICD-10-CM

## 2022-11-23 LAB — LIPID PANEL
Cholesterol: 101 mg/dL (ref 0–200)
HDL: 37.3 mg/dL — ABNORMAL LOW (ref >39.00)
LDL Cholesterol: 46 mg/dL (ref 0–99)
NonHDL: 64
Total CHOL/HDL Ratio: 3
Triglycerides: 88 mg/dL (ref 0.0–149.0)
VLDL: 17.6 mg/dL (ref 0.0–40.0)

## 2022-11-23 LAB — BASIC METABOLIC PANEL
BUN: 15 mg/dL (ref 6–23)
CO2: 30 mEq/L (ref 19–32)
Calcium: 9.6 mg/dL (ref 8.4–10.5)
Chloride: 104 mEq/L (ref 96–112)
Creatinine, Ser: 1.21 mg/dL (ref 0.40–1.50)
GFR: 70.8 mL/min (ref >60.00)
Glucose, Bld: 139 mg/dL — ABNORMAL HIGH (ref 70–99)
Potassium: 4.6 mEq/L (ref 3.5–5.1)
Sodium: 140 mEq/L (ref 135–145)

## 2022-11-23 LAB — HEMOGLOBIN A1C: Hgb A1c MFr Bld: 8.1 % — ABNORMAL HIGH (ref 4.6–6.5)

## 2022-11-26 ENCOUNTER — Ambulatory Visit: Payer: 59 | Admitting: Endocrinology

## 2022-11-27 ENCOUNTER — Encounter: Payer: Self-pay | Admitting: Endocrinology

## 2022-11-27 ENCOUNTER — Ambulatory Visit (INDEPENDENT_AMBULATORY_CARE_PROVIDER_SITE_OTHER): Payer: Commercial Managed Care - HMO | Admitting: Endocrinology

## 2022-11-27 DIAGNOSIS — E1165 Type 2 diabetes mellitus with hyperglycemia: Secondary | ICD-10-CM | POA: Diagnosis not present

## 2022-11-27 DIAGNOSIS — E7800 Pure hypercholesterolemia, unspecified: Secondary | ICD-10-CM | POA: Diagnosis not present

## 2022-11-27 MED ORDER — FREESTYLE LIBRE 3 SENSOR MISC: 1.0000 | 2 refills | Status: DC

## 2022-11-27 MED ORDER — ACARBOSE 50 MG PO TABS: ORAL_TABLET | ORAL | 3 refills | Status: DC

## 2022-11-27 MED ORDER — ATORVASTATIN CALCIUM 10 MG PO TABS: 10.0000 mg | ORAL_TABLET | Freq: Every day | ORAL | 3 refills | Status: DC

## 2022-11-27 MED ORDER — RYBELSUS 7 MG PO TABS: 1.0000 | ORAL_TABLET | Freq: Every day | ORAL | 3 refills | Status: DC

## 2022-11-27 MED ORDER — GLIMEPIRIDE 1 MG PO TABS: ORAL_TABLET | ORAL | 1 refills | Status: DC

## 2022-11-27 NOTE — Patient Instructions (Addendum)
Check blood sugars on waking up 3 days a week  Also check blood sugars about 2 hours after meals and do this after different meals by rotation  Recommended blood sugar levels on waking up are 90-130 and about 2 hours after meal is 130-160  Please bring your blood sugar monitor to each visit, thank you  Exercise daily 

## 2022-11-27 NOTE — Progress Notes (Signed)
Patient ID: Joe Boyd, male   DOB: 12-Apr-1976, 46 y.o.   MRN: [id number]           Reason for Appointment: Follow-up visit   History of Present Illness:          Diagnosis: Type 2 diabetes mellitus, date of diagnosis: 2012        Past history:  At time of diagnosis he was having symptoms of increased thirst and urination and he was seen by his physician when he started feeling weak and dizzy also. His A1c at diagnosis was 12.8. He thinks he was treated with metformin only and also he started changing his diet along with eliminating sweet soft drinks.  Initially was treated with only 500 mg of metformin ER and in 2014 this was increased to 1000 mg.  He has not had any other medications for diabetes. He was last seen by his endocrinologist in [place] in 07/2014 when his A1c was 6.5 and no changes were made A1c of over 7%  in 7/16 and he was started on Kombiglyze XR In 6/17 with his A1c going up to 7.6 he was given Glyset 25 mg at lunch also in addition to [employer]  Recent history:   A1c is back up to 8.1, previously was down to 7.1       Oral hypoglycemic drugs :  Acarbose 50 mg at breakfast and 100 mg at lunch , Januvia 100 mg daily, Amaryl 1 mg at bedtime  Current management, blood sugar patterns and problems identified: With markedly improving his diet, cutting back on carbohydrates and exercising his blood sugars are improved significantly on the last visit However since then he has not been motivated to watch his diet or exercise Has gained back some weight Also has forgotten to check his blood sugar except for today was about 175 3 hours after breakfast Lab glucose was 139 fasting, higher than before He has generally taken his medications as directed except he ran out of acarbose about 4 days ago       Side effects from medications have been: None   Glucose monitoring:  done occasionally,   as above     glucometer: One Touch Verio  Self-care:  Meals:  1-2 meals per  day.  Tries to get some protein with his lunch, sometimes yogurt, sometimes has chicken.    Usually avoiding snacks               Dietician visit, most recent: 7/16                Weight history:   Wt Readings from Last 3 Encounters:  11/27/22 158 lb 6.4 oz (71.8 kg)  07/16/22 152 lb (68.9 kg)  05/12/22 158 lb 4 oz (71.8 kg)    Glycemic control:   Lab Results  Component Value Date   HGBA1C 8.1 (H) 11/23/2022   HGBA1C 7.1 (H) 07/13/2022   HGBA1C 7.7 (H) 04/06/2022   Lab Results  Component Value Date   MICROALBUR 0.9 07/13/2022   LDLCALC 46 11/23/2022   CREATININE 1.21 11/23/2022   Lab on 11/23/2022  Component Date Value Ref Range Status   Cholesterol 11/23/2022 101  0 - 200 mg/dL Final   ATP III Classification       Desirable:  < 200 mg/dL               Borderline High:  200 - 239 mg/dL          High:  > =  240 mg/dL   Triglycerides 16/09/9603 88.0  0.0 - 149.0 mg/dL Final   Normal:  <540 mg/dLBorderline High:  150 - 199 mg/dL   HDL 98/10/9146 82.95 (L)  >62.13 mg/dL Final   VLDL 08/65/7846 17.6  0.0 - 40.0 mg/dL Final   LDL Cholesterol 11/23/2022 46  0 - 99 mg/dL Final   Total CHOL/HDL Ratio 11/23/2022 3   Final                  Men          Women1/2 Average Risk     3.4          3.3Average Risk          5.0          4.42X Average Risk          9.6          7.13X Average Risk          15.0          11.0                       NonHDL 11/23/2022 64.00   Final   NOTE:  Non-HDL goal should be 30 mg/dL higher than patient's LDL goal (i.e. LDL goal of < 70 mg/dL, would have non-HDL goal of < 100 mg/dL)   Sodium 96/29/5284 132  135 - 145 mEq/L Final   Potassium 11/23/2022 4.6  3.5 - 5.1 mEq/L Final   Chloride 11/23/2022 104  96 - 112 mEq/L Final   CO2 11/23/2022 30  19 - 32 mEq/L Final   Glucose, Bld 11/23/2022 139 (H)  70 - 99 mg/dL Final   BUN 44/12/270 15  6 - 23 mg/dL Final   Creatinine, Ser 11/23/2022 1.21  0.40 - 1.50 mg/dL Final   GFR 53/66/4403 70.80  >60.00 mL/min  Final   Calculated using the CKD-EPI Creatinine Equation (2021)   Calcium 11/23/2022 9.6  8.4 - 10.5 mg/dL Final   Hgb K7Q MFr Bld 11/23/2022 8.1 (H)  4.6 - 6.5 % Final   Glycemic Control Guidelines for People with Diabetes:Non Diabetic:  <6%Goal of Therapy: <7%Additional Action Suggested:  >8%       Allergies as of 11/27/2022       Reactions   Augmentin [amoxicillin-pot Clavulanate] Itching   Diclofenac    Abnormal liver functions        Medication List        Accurate as of November 27, 2022 11:59 PM. If you have any questions, ask your nurse or doctor.          STOP taking these medications    sitaGLIPtin 100 MG tablet Commonly known as: Januvia Stopped by: Reather Littler, MD       TAKE these medications    acarbose 50 MG tablet Commonly known as: PRECOSE Take 2 tablet by mouth daily at lunch.  May also take 1 tab at breakfast and dinner if eating   albuterol 108 (90 Base) MCG/ACT inhaler Commonly known as: VENTOLIN HFA Inhale 2 puffs into the lungs every 6 (six) hours as needed for wheezing or shortness of breath.   atorvastatin 10 MG tablet Commonly known as: LIPITOR Take 1 tablet (10 mg total) by mouth daily.   clotrimazole 1 % cream Commonly known as: LOTRIMIN Apply 1 application topically 2 (two) times daily.   doxycycline 100 MG tablet Commonly known as: VIBRA-TABS Take 1 tablet (100 mg total) by  mouth 2 (two) times daily.   FreeStyle Libre 3 Sensor Misc 1 Device by Does not apply route every 14 (fourteen) days. Apply 1 sensor on upper arm every 14 days for continuous glucose monitoring Started by: Reather Littler, MD   glimepiride 1 MG tablet Commonly known as: AMARYL Take 1 tablet by mouth once a day at bedtime   multivitamin with minerals Tabs tablet Take 1 tablet by mouth daily.   OneTouch Delica Lancets 30G Misc Use to check blood sugars daily-Dx code E11.65   OneTouch Verio Sync System w/Device Kit Use to check blood sugars daily-Dx code  E11.65   OneTouch Verio test strip Generic drug: glucose blood Use to check blood sugars daily-Dx code E11.65   Rybelsus 7 MG Tabs Generic drug: Semaglutide Take 1 tablet by mouth daily before breakfast. Take 30 minutes before breakfast with 4 oz. water Started by: Reather Littler, MD        Allergies:  Allergies  Allergen Reactions   Augmentin [Amoxicillin-Pot Clavulanate] Itching   Diclofenac     Abnormal liver functions    Past Medical History:  Diagnosis Date   Diabetes mellitus without complication (HCC)    Tuberculosis    Finished Rx in 8/15    No past surgical history on file.  Family History  Problem Relation Age of Onset   Diabetes Mother    Hypertension Mother    Thyroid disease Mother    Heart disease Mother    Diabetes Father    Hypertension Father    Heart disease Father    Breast cancer Sister        30s   Heart disease Paternal Uncle    Cancer Neg Hx    Colon cancer Neg Hx     Social History:  reports that he has never smoked. He has never used smokeless tobacco. He reports current alcohol use. He reports that he does not use drugs.    Review of Systems        Lipids:  LDL and triglycerides have been variable Started on atorvastatin 10 mg daily in 4/21 because of high LDL and cardiovascular risk factors LDL is controlled although HDL slightly lower        Lab Results  Component Value Date   CHOL 101 11/23/2022   CHOL 107 01/08/2022   CHOL 90 04/08/2021   Lab Results  Component Value Date   HDL 37.30 (L) 11/23/2022   HDL 39.70 01/08/2022   HDL 37.80 (L) 04/08/2021   Lab Results  Component Value Date   LDLCALC 46 11/23/2022   LDLCALC 46 01/08/2022   LDLCALC 37 04/08/2021   Lab Results  Component Value Date   TRIG 88.0 11/23/2022   TRIG 106.0 01/08/2022   TRIG 78.0 04/08/2021   Lab Results  Component Value Date   CHOLHDL 3 11/23/2022   CHOLHDL 3 01/08/2022   CHOLHDL 2 04/08/2021   No results found for: "LDLDIRECT"                  Thyroid: History of a small goiter with normal TSH   Lab Results  Component Value Date   TSH 0.98 10/04/2019   No history of hypertension  BP Readings from Last 3 Encounters:  11/27/22 124/70  07/16/22 118/82  05/12/22 106/70    HYPERKALEMIA:  His potassium had been previously high at times without obvious cause   Lab Results  Component Value Date   K 4.6 11/23/2022    HYPERCALCEMIA: He appears  to have had high normal or high calcium levels, no history of kidney stones or renal dysfunction Previous normal  He was told to stop his calcium supplement when calcium was high in 10/21 Calcium consistently upper normal  Lab Results  Component Value Date   CALCIUM 9.6 11/23/2022   CALCIUM 10.0 07/13/2022   CALCIUM 10.1 04/06/2022   CALCIUM 10.0 01/08/2022   CALCIUM 10.2 08/12/2021   CALCIUM 10.1 06/13/2021   Lab Results  Component Value Date   PTH 15 12/17/2020   CALCIUM 9.6 11/23/2022   No symptoms of neuropathy in feet  Most recent eye exam showed mild unilateral background retinopathy  LABS:  Lab on 11/23/2022  Component Date Value Ref Range Status   Cholesterol 11/23/2022 101  0 - 200 mg/dL Final   ATP III Classification       Desirable:  < 200 mg/dL               Borderline High:  200 - 239 mg/dL          High:  > = 161 mg/dL   Triglycerides 09/60/4540 88.0  0.0 - 149.0 mg/dL Final   Normal:  <981 mg/dLBorderline High:  150 - 199 mg/dL   HDL 19/14/7829 56.21 (L)  >30.86 mg/dL Final   VLDL 57/84/6962 17.6  0.0 - 40.0 mg/dL Final   LDL Cholesterol 11/23/2022 46  0 - 99 mg/dL Final   Total CHOL/HDL Ratio 11/23/2022 3   Final                  Men          Women1/2 Average Risk     3.4          3.3Average Risk          5.0          4.42X Average Risk          9.6          7.13X Average Risk          15.0          11.0                       NonHDL 11/23/2022 64.00   Final   NOTE:  Non-HDL goal should be 30 mg/dL higher than patient's LDL goal (i.e. LDL  goal of < 70 mg/dL, would have non-HDL goal of < 100 mg/dL)   Sodium 95/28/4132 440  135 - 145 mEq/L Final   Potassium 11/23/2022 4.6  3.5 - 5.1 mEq/L Final   Chloride 11/23/2022 104  96 - 112 mEq/L Final   CO2 11/23/2022 30  19 - 32 mEq/L Final   Glucose, Bld 11/23/2022 139 (H)  70 - 99 mg/dL Final   BUN 10/24/2535 15  6 - 23 mg/dL Final   Creatinine, Ser 11/23/2022 1.21  0.40 - 1.50 mg/dL Final   GFR 64/40/3474 70.80  >60.00 mL/min Final   Calculated using the CKD-EPI Creatinine Equation (2021)   Calcium 11/23/2022 9.6  8.4 - 10.5 mg/dL Final   Hgb Q5Z MFr Bld 11/23/2022 8.1 (H)  4.6 - 6.5 % Final   Glycemic Control Guidelines for People with Diabetes:Non Diabetic:  <6%Goal of Therapy: <7%Additional Action Suggested:  >8%     Physical Examination:  BP 124/70   Pulse 79   Ht 5\' 8"  (1.727 m)   Wt 158 lb 6.4 oz (71.8 kg)   SpO2 98%  BMI 24.08 kg/m      ASSESSMENT: 7/23  Diabetes type 2 , nonobese See history of present illness for detailed discussion of current diabetes management, blood sugar patterns and problems identified  His A1c is 8.1  Blood sugars are worse again and appear to be dependent on his watching his diet or exercising However likely can benefit from a more effective drug than Januvia especially with long-term cardiovascular and other benefits  However glucose monitoring is still inadequate and discussed that doing regular monitoring will help him keep up with compliance also  LIPIDS: Well-controlled with long-term Lipitor but HDL is relatively lower and may benefit from regular exercise and consistent diet  PLAN:   Trial of Rybelsus 3 mg daily Discussed with the patient the nature of GLP-1 drugs, the actions on insulin secretion, slowing stomach emptying, reduction of appetite and reduced liver glucose production Explained that Rybelsus improves blood sugar control as well as produces weight loss and reduces cardiovascular events. Explained possible  side effects especially nausea and vomiting that may occur in the first few days; usually side effects improve with time.  Patient to call if nausea or vomiting persists Instructed to take the capsules on empty stomach 30 minutes before breakfast with 4 ounces of water daily.  Patient education material given Sample of 3 mg given After 4 weeks he will go up to 7 mg and he will call for new prescription Stop Januvia and no change in other medications Check blood sugars more consistently Exercise at least 5 days a week   Patient Instructions  Check blood sugars on waking up 3 days a week  Also check blood sugars about 2 hours after meals and do this after different meals by rotation  Recommended blood sugar levels on waking up are 90-130 and about 2 hours after meal is 130-160  Please bring your blood sugar monitor to each visit, thank you  Exercise daily   Reather Littler 11/29/2022, 11:59 AM   Note: This office note was prepared with Dragon voice recognition system technology. Any transcriptional errors that result from this process are unintentional.

## 2022-12-22 ENCOUNTER — Encounter: Payer: Self-pay | Admitting: Endocrinology

## 2022-12-22 DIAGNOSIS — E1165 Type 2 diabetes mellitus with hyperglycemia: Secondary | ICD-10-CM

## 2022-12-23 MED ORDER — RYBELSUS 3 MG PO TABS: 3.0000 mg | ORAL_TABLET | Freq: Every day | ORAL | 2 refills | Status: DC

## 2022-12-24 MED ORDER — RYBELSUS 3 MG PO TABS: 3.0000 mg | ORAL_TABLET | Freq: Every day | ORAL | 2 refills | Status: DC

## 2022-12-24 NOTE — Addendum Note (Signed)
Addended by: Eliseo Squires on: 12/24/2022 10:08 AM   Modules accepted: Orders

## 2022-12-25 ENCOUNTER — Other Ambulatory Visit (HOSPITAL_COMMUNITY): Payer: Self-pay

## 2022-12-25 ENCOUNTER — Telehealth: Payer: Self-pay

## 2022-12-25 NOTE — Telephone Encounter (Signed)
Pharmacy Patient Advocate Encounter   Received notification from [employer] that prior authorization for RYBELSUS 3 MG TAB is needed.    PA submitted on 12/25/22 Key [id number] Status is pending  Haze Rushing, CPhT Pharmacy Patient Advocate Specialist Direct Number: [phone] Fax: [phone]

## 2022-12-25 NOTE — Telephone Encounter (Signed)
Please be advised appeals may take up to 5 business days to be submitted as pharmacist prepares necessary documentation. Thank you!  Haze Rushing, CPhT Pharmacy Patient Advocate Specialist Direct Number: [phone] Fax: [phone]

## 2022-12-25 NOTE — Telephone Encounter (Signed)
Pharmacy Patient Advocate Encounter  Received notification from [employer]   that the request for prior authorization for RYBELSUS 3 MG TAB has been denied due to PATIENT DOES NOT HAVE DOCUMENTATION OF INTOLERANCE OR INABILITY TO USE BYDUREON, BYDUREON BCISE, OR BYETTA (B), TRULICITY.      PLEASE ADVISE   Haze Rushing, CPhT Pharmacy Patient Advocate Specialist Direct Number: [phone] Fax: [phone]

## 2022-12-26 ENCOUNTER — Other Ambulatory Visit: Payer: Self-pay | Admitting: Endocrinology

## 2022-12-26 MED ORDER — SAXAGLIPTIN-METFORMIN ER 5-1000 MG PO TB24: ORAL_TABLET | ORAL | 2 refills | Status: DC

## 2022-12-30 ENCOUNTER — Other Ambulatory Visit (HOSPITAL_COMMUNITY): Payer: Self-pay

## 2022-12-30 ENCOUNTER — Telehealth: Payer: Self-pay

## 2022-12-30 NOTE — Telephone Encounter (Signed)
Patient Advocate Encounter   Received notification from [employer] that prior authorization is required for Rybelsus 3MG  tablets  Submitted: 12-30-2022 Key [id number]  Status is pending

## 2022-12-30 NOTE — Telephone Encounter (Signed)
Patient no longer under [employer]. New encounter created for prior authorization with patient new insurance [employer].

## 2022-12-31 NOTE — Telephone Encounter (Signed)
Patient Advocate Encounter  Received a fax from [facility] regarding Prior Authorization for Rybelsus 3MG  tablets .   Authorization has been DENIED due to The policy states that this medication may be approved when: -The member has a clinical condition or needs a specific dosage form for which there is no alternative on the formulary OR -The listed formulary alternatives are not recommended based on published guidelines or clinical literature OR -The formulary alternatives will likely be ineffective or less effective for the member OR -The formulary alternatives will likely cause an adverse effect OR -The member is unable to take the required number of formulary alternatives for the given diagnosis due to a trial and inadequate treatment response or contraindication OR -The member has tried and failed the required number of formulary alternatives. Based on the policy and the information we have, your request is denied. We did not receive any documentation that you meet any of the criteria outlined above. Formulary alternative(s) are Trulicity, Victoza. Requirement: 3 in a class with 3 or more alternatives, 2 in a class with 2 alternatives, or 1 in a class with only 1 alternative. Please refer to your plan documents for a complete list of alternatives. Note: Formulary alternatives may require a prior authorization.Marland Kitchen

## 2023-01-01 ENCOUNTER — Other Ambulatory Visit (HOSPITAL_COMMUNITY): Payer: Self-pay

## 2023-01-01 NOTE — Telephone Encounter (Signed)
Disregard previous message. I seen your message on previous encounter.

## 2023-01-01 NOTE — Telephone Encounter (Signed)
Called to let patient know about Trulicity. He is very adamant that he cannot do ANY injections. Please advise. Also, he got a message from pharmacy that [place]  was ready for pickup. He wants to know if he needs to pick that up also.

## 2023-01-01 NOTE — Telephone Encounter (Signed)
Called patient states that the saxagliptin/metformin is $347 he will try to get a discount code on [facility] and see if he can get it at a discount. Otherwise he will try to pick it up.

## 2023-01-06 ENCOUNTER — Other Ambulatory Visit (HOSPITAL_COMMUNITY): Payer: Self-pay

## 2023-01-06 ENCOUNTER — Other Ambulatory Visit: Payer: Self-pay | Admitting: Endocrinology

## 2023-01-06 DIAGNOSIS — E1165 Type 2 diabetes mellitus with hyperglycemia: Secondary | ICD-10-CM

## 2023-01-06 MED ORDER — SAXAGLIPTIN-METFORMIN ER 5-500 MG PO TB24: ORAL_TABLET | ORAL | 2 refills | Status: DC

## 2023-01-06 NOTE — Addendum Note (Signed)
Addended by: Cinda Quest on: 01/06/2023 04:35 PM   Modules accepted: Orders

## 2023-02-16 ENCOUNTER — Other Ambulatory Visit: Payer: Self-pay | Admitting: Endocrinology

## 2023-02-16 DIAGNOSIS — E1165 Type 2 diabetes mellitus with hyperglycemia: Secondary | ICD-10-CM

## 2023-02-17 ENCOUNTER — Other Ambulatory Visit (INDEPENDENT_AMBULATORY_CARE_PROVIDER_SITE_OTHER): Payer: 59

## 2023-02-17 DIAGNOSIS — E1165 Type 2 diabetes mellitus with hyperglycemia: Secondary | ICD-10-CM | POA: Diagnosis not present

## 2023-02-17 LAB — BASIC METABOLIC PANEL
BUN: 15 mg/dL (ref 6–23)
CO2: 26 mEq/L (ref 19–32)
Calcium: 10.5 mg/dL (ref 8.4–10.5)
Chloride: 106 mEq/L (ref 96–112)
Creatinine, Ser: 1.2 mg/dL (ref 0.40–1.50)
GFR: 71.39 mL/min (ref >60.00)
Glucose, Bld: 105 mg/dL — ABNORMAL HIGH (ref 70–99)
Potassium: 5.2 mEq/L — ABNORMAL HIGH (ref 3.5–5.1)
Sodium: 144 mEq/L (ref 135–145)

## 2023-02-17 LAB — HEMOGLOBIN A1C: Hgb A1c MFr Bld: 6.9 % — ABNORMAL HIGH (ref 4.6–6.5)

## 2023-02-22 ENCOUNTER — Ambulatory Visit: Payer: 59 | Admitting: Endocrinology

## 2023-02-22 ENCOUNTER — Encounter: Payer: Self-pay | Admitting: Endocrinology

## 2023-02-22 VITALS — BP 118/72 | HR 73 | Ht 68.0 in | Wt 148.0 lb

## 2023-02-22 DIAGNOSIS — E7800 Pure hypercholesterolemia, unspecified: Secondary | ICD-10-CM | POA: Diagnosis not present

## 2023-02-22 DIAGNOSIS — E875 Hyperkalemia: Secondary | ICD-10-CM

## 2023-02-22 DIAGNOSIS — E1165 Type 2 diabetes mellitus with hyperglycemia: Secondary | ICD-10-CM

## 2023-02-22 NOTE — Patient Instructions (Addendum)
Take Kombiglyze at [place] blood sugars on waking up 2 days a week  Also check blood sugars about 2 hours after meals and do this after different meals by rotation  Recommended blood sugar levels on waking up are 90-130 and about 2 hours after meal is 130-160  Please bring your blood sugar monitor to each visit, thank you

## 2023-02-22 NOTE — Progress Notes (Signed)
Patient ID: Joe Boyd, male   DOB: [birth year], 47 y.o.   MRN: [id number]           Reason for Appointment: Follow-up visit   History of Present Illness:          Diagnosis: Type 2 diabetes mellitus, date of diagnosis: 2012        Past history:  At time of diagnosis he was having symptoms of increased thirst and urination and he was seen by his physician when he started feeling weak and dizzy also. His A1c at diagnosis was 12.8. He thinks he was treated with metformin only and also he started changing his diet along with eliminating sweet soft drinks.  Initially was treated with only 500 mg of metformin ER and in 2014 this was increased to 1000 mg.  He has not had any other medications for diabetes. He was last seen by his endocrinologist in [place] in 07/2014 when his A1c was 6.5 and no changes were made A1c of over 7%  in 7/16 and he was started on Kombiglyze XR In 6/17 with his A1c going up to 7.6 he was given Glyset 25 mg at lunch also in addition to [employer]  Recent history:   A1c is back down to 6.9 compared to 8.1       Oral hypoglycemic drugs :  Acarbose 50 mg at breakfast and 100 mg at lunch Amaryl 1 mg at bedtime, Kombiglyze 5/500 daily at dinner  Current management, blood sugar patterns and problems identified: Because of the poor control in December he was given Rybelsus 3 mg 30 days sample He was also not watching his diet at all or exercising regularly with resulting weight gain Control was worsening despite taking Januvia With Rybelsus his blood sugars are better controlled but since his insurance did not approve it and he did not want injectable drugs he was switched to Bosque lowest dose He has no GI side effects with this compared to metformin by itself Also likely with improving his diet, cutting back on carbohydrates and exercising his blood sugars are looking better Has lost a difficult amount of weight since his last visit He says he is taking his  Precose consistently and his largest meal is either breakfast or lunch but he is taking his Kombiglyze in the evening      Side effects from medications have been: None   Glucose monitoring:  done occasionally, recent afternoon blood sugar range 108-141 with only about 4 readings in the last month Using   glucometer: One Touch Verio  Self-care:  Meals: Usually eating very little at dinnertime Tries to get some protein with his lunch, sometimes yogurt, sometimes has chicken.    Usually avoiding snacks               Dietician visit, most recent: 7/16                Weight history:   Wt Readings from Last 3 Encounters:  02/22/23 148 lb (67.1 kg)  11/27/22 158 lb 6.4 oz (71.8 kg)  07/16/22 152 lb (68.9 kg)    Glycemic control:   Lab Results  Component Value Date   HGBA1C 6.9 (H) 02/17/2023   HGBA1C 8.1 (H) 11/23/2022   HGBA1C 7.1 (H) 07/13/2022   Lab Results  Component Value Date   MICROALBUR 0.9 07/13/2022   LDLCALC 46 11/23/2022   CREATININE 1.20 02/17/2023   Lab on 02/17/2023  Component Date Value Ref Range Status  Sodium 02/17/2023 144  135 - 145 mEq/L Final   Potassium 02/17/2023 5.2 No hemolysis seen (H)  3.5 - 5.1 mEq/L Final   Chloride 02/17/2023 106  96 - 112 mEq/L Final   CO2 02/17/2023 26  19 - 32 mEq/L Final   Glucose, Bld 02/17/2023 105 (H)  70 - 99 mg/dL Final   BUN 02/17/2023 15  6 - 23 mg/dL Final   Creatinine, Ser 02/17/2023 1.20  0.40 - 1.50 mg/dL Final   GFR 02/17/2023 71.39  >60.00 mL/min Final   Calculated using the CKD-EPI Creatinine Equation (2021)   Calcium 02/17/2023 10.5  8.4 - 10.5 mg/dL Final   Hgb A1c MFr Bld 02/17/2023 6.9 (H)  4.6 - 6.5 % Final   Glycemic Control Guidelines for People with Diabetes:Non Diabetic:  <6%Goal of Therapy: <7%Additional Action Suggested:  >8%       Allergies as of 02/22/2023       Reactions   Augmentin [amoxicillin-pot Clavulanate] Itching   Diclofenac    Abnormal liver functions        Medication  List        Accurate as of February 22, 2023  3:08 PM. If you have any questions, ask your nurse or doctor.          STOP taking these medications    doxycycline 100 MG tablet Commonly known as: VIBRA-TABS Stopped by: Elayne Snare, MD   Rybelsus 3 MG Tabs Generic drug: Semaglutide Stopped by: Elayne Snare, MD       TAKE these medications    acarbose 50 MG tablet Commonly known as: PRECOSE Take 2 tablet by mouth daily at lunch.  May also take 1 tab at breakfast and dinner if eating   albuterol 108 (90 Base) MCG/ACT inhaler Commonly known as: VENTOLIN HFA Inhale 2 puffs into the lungs every 6 (six) hours as needed for wheezing or shortness of breath.   atorvastatin 10 MG tablet Commonly known as: LIPITOR Take 1 tablet (10 mg total) by mouth daily.   clotrimazole 1 % cream Commonly known as: LOTRIMIN Apply 1 application topically 2 (two) times daily.   FreeStyle Libre 3 Sensor Misc 1 Device by Does not apply route every 14 (fourteen) days. Apply 1 sensor on upper arm every 14 days for continuous glucose monitoring   glimepiride 1 MG tablet Commonly known as: AMARYL Take 1 tablet by mouth once a day at bedtime   multivitamin with minerals Tabs tablet Take 1 tablet by mouth daily.   OneTouch Delica Lancets 99991111 Misc Use to check blood sugars daily-Dx code E11.65   OneTouch Verio Sync System w/Device Kit Use to check blood sugars daily-Dx code E11.65   OneTouch Verio test strip Generic drug: glucose blood Use to check blood sugars daily-Dx code E11.65   Saxagliptin-Metformin 5-500 MG Tb24 TAKE 1 TABLET BY MOUTH EVERY DAY What changed: Another medication with the same name was removed. Continue taking this medication, and follow the directions you see here. Changed by: Elayne Snare, MD        Allergies:  Allergies  Allergen Reactions   Augmentin [Amoxicillin-Pot Clavulanate] Itching   Diclofenac     Abnormal liver functions    Past Medical History:   Diagnosis Date   Diabetes mellitus without complication (Joe Boyd)    Tuberculosis    Finished Rx in 8/15    No past surgical history on file.  Family History  Problem Relation Age of Onset   Diabetes Mother    Hypertension Mother  Thyroid disease Mother    Heart disease Mother    Diabetes Father    Hypertension Father    Heart disease Father    Breast cancer Sister        63s   Heart disease Paternal Uncle    Cancer Neg Hx    Colon cancer Neg Hx     Social History:  reports that he has never smoked. He has never used smokeless tobacco. He reports current alcohol use. He reports that he does not use drugs.    Review of Systems        Lipids:  LDL and triglycerides have been variable Started on atorvastatin 10 mg daily in 4/21 because of high LDL and cardiovascular risk factors LDL is controlled although HDL slightly lower        Lab Results  Component Value Date   CHOL 101 11/23/2022   CHOL 107 01/08/2022   CHOL 90 04/08/2021   Lab Results  Component Value Date   HDL 37.30 (L) 11/23/2022   HDL 39.70 01/08/2022   HDL 37.80 (L) 04/08/2021   Lab Results  Component Value Date   LDLCALC 46 11/23/2022   LDLCALC 46 01/08/2022   LDLCALC 37 04/08/2021   Lab Results  Component Value Date   TRIG 88.0 11/23/2022   TRIG 106.0 01/08/2022   TRIG 78.0 04/08/2021   Lab Results  Component Value Date   CHOLHDL 3 11/23/2022   CHOLHDL 3 01/08/2022   CHOLHDL 2 04/08/2021   No results found for: "LDLDIRECT"                 Thyroid: History of a small goiter with normal TSH   Lab Results  Component Value Date   TSH 0.98 10/04/2019   No history of hypertension  BP Readings from Last 3 Encounters:  02/22/23 118/72  11/27/22 124/70  07/16/22 118/82    HYPERKALEMIA:  His potassium had been periodically high at times without obvious cause Diet is fairly good with usually not eating bananas and rarely eating much citrus fruit, not on potassium supplements  Lab  Results  Component Value Date   K 5.2 No hemolysis seen (H) 02/17/2023    HYPERCALCEMIA: He appears to have had high normal or high calcium levels, no history of kidney stones or renal dysfunction Previous normal  He was told to stop his calcium supplement when calcium was high in 10/21 Calcium consistently upper normal  Lab Results  Component Value Date   CALCIUM 10.5 02/17/2023   CALCIUM 9.6 11/23/2022   CALCIUM 10.0 07/13/2022   CALCIUM 10.1 04/06/2022   CALCIUM 10.0 01/08/2022   CALCIUM 10.2 08/12/2021   Lab Results  Component Value Date   PTH 15 12/17/2020   CALCIUM 10.5 02/17/2023   No symptoms of neuropathy in feet or tingling but he has some questions about pain in his right foot in the distal and medial part especially on walking  Most recent eye exam showed mild unilateral background retinopathy  LABS:  Lab on 02/17/2023  Component Date Value Ref Range Status   Sodium 02/17/2023 144  135 - 145 mEq/L Final   Potassium 02/17/2023 5.2 No hemolysis seen (H)  3.5 - 5.1 mEq/L Final   Chloride 02/17/2023 106  96 - 112 mEq/L Final   CO2 02/17/2023 26  19 - 32 mEq/L Final   Glucose, Bld 02/17/2023 105 (H)  70 - 99 mg/dL Final   BUN 02/17/2023 15  6 - 23 mg/dL Final  Creatinine, Ser 02/17/2023 1.20  0.40 - 1.50 mg/dL Final   GFR 02/17/2023 71.39  >60.00 mL/min Final   Calculated using the CKD-EPI Creatinine Equation (2021)   Calcium 02/17/2023 10.5  8.4 - 10.5 mg/dL Final   Hgb A1c MFr Bld 02/17/2023 6.9 (H)  4.6 - 6.5 % Final   Glycemic Control Guidelines for People with Diabetes:Non Diabetic:  <6%Goal of Therapy: <7%Additional Action Suggested:  >8%     Physical Examination:  BP 118/72 (BP Location: Left Arm, Patient Position: Sitting, Cuff Size: Small)   Pulse 73   Ht '5\' 8"'$  (1.727 m)   Wt 148 lb (67.1 kg)   SpO2 96%   BMI 22.50 kg/m   No swelling, tenderness or abnormality on inspection of his right foot including joints   ASSESSMENT:   Diabetes type 2  , nonobese See history of present illness for detailed discussion of current diabetes management, blood sugar patterns and problems identified  His A1c is 6.9 compared to 8.1  Blood sugars are improved most likely from going back on his diet and losing weight Also may be getting some benefit from adding metformin low-dose to his Onglyza  PLAN:   He will continue same medication regimen but need to switch his Kombiglyze to breakfast with a meal Needs to check blood sugars more regularly including at different times Discussed with him the possibility of using Trulicity and he is open to the idea of using a self injector  He will discuss his foot pain with his PCP or podiatrist as it is likely musculoskeletal and not related to neuropathy  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  Elayne Snare 02/22/2023, 3:08 PM   Note: This office note was prepared with Dragon voice recognition system technology. Any transcriptional errors that result from this process are unintentional.

## 2023-03-18 ENCOUNTER — Encounter: Payer: Self-pay | Admitting: Endocrinology

## 2023-04-12 ENCOUNTER — Telehealth: Payer: Self-pay

## 2023-04-12 NOTE — Telephone Encounter (Signed)
Can we find our what the alternatives are for [facility]

## 2023-04-14 ENCOUNTER — Telehealth: Payer: Self-pay

## 2023-04-14 ENCOUNTER — Other Ambulatory Visit (HOSPITAL_COMMUNITY): Payer: Self-pay

## 2023-04-14 NOTE — Telephone Encounter (Signed)
Brand name is preferred however the XR 500-50 has been D/C. Separate ingredient of Onglyza is also not covered, trying for the generic formulation of Kombiglyze. Will be updated in additional encounter created.

## 2023-04-14 NOTE — Telephone Encounter (Signed)
PA request received via Provider for Kombiglyze, brand is preferred however dose of 500-50 XR has been discontinued  PA has been submitted for generic sAXagliptin-metFORMIN ER 5-500MG  er tablets Via CMM to [employer] and is pending determination  Key: [id number]

## 2023-04-15 ENCOUNTER — Other Ambulatory Visit (HOSPITAL_COMMUNITY): Payer: Self-pay

## 2023-04-15 ENCOUNTER — Other Ambulatory Visit: Payer: Self-pay | Admitting: Endocrinology

## 2023-04-15 MED ORDER — JANUMET XR 50-500 MG PO TB24: ORAL_TABLET | ORAL | 3 refills | Status: DC

## 2023-04-15 NOTE — Telephone Encounter (Signed)
Pharmacy Patient Advocate Encounter  PA was DENIED due to:    Denial letter saved to pt chart

## 2023-04-16 MED ORDER — JANUMET XR 50-500 MG PO TB24: ORAL_TABLET | ORAL | 3 refills | Status: DC

## 2023-04-16 NOTE — Addendum Note (Signed)
Addended by: Kenyon Ana on: 04/16/2023 05:21 PM   Modules accepted: Orders

## 2023-04-19 ENCOUNTER — Telehealth: Payer: Self-pay | Admitting: Pharmacy Technician

## 2023-04-19 ENCOUNTER — Other Ambulatory Visit (HOSPITAL_COMMUNITY): Payer: Self-pay

## 2023-04-19 NOTE — Telephone Encounter (Signed)
Pharmacy Patient Advocate Encounter   Received notification from Pt advice request msgs/CMA that prior authorization for Janumet XR 50-500mg  is required/requested.  Per Test Claim: step Therapy   PA submitted on 04/19/23 to (ins) [employer] via [employer] or (Medicaid) confirmation # [id number] - PA Case ID: [id number] Status is pending

## 2023-04-19 NOTE — Telephone Encounter (Signed)
Pharmacy Patient Advocate Encounter  Prior Authorization for Janumet XR has been approved by [employer] (ins).    PA # PA Case ID #: [id number] Effective dates: 04/18/24 through 04/17/24  Spoke with Pharmacy to process. Will have to order to be ready tomorrow. Copay is $12.68

## 2023-04-19 NOTE — Telephone Encounter (Signed)
PA request submitted. New Encounter created for follow up. For additional info see Prior Auth telephone encounter from 04/19/23.

## 2023-05-11 DIAGNOSIS — E119 Type 2 diabetes mellitus without complications: Secondary | ICD-10-CM | POA: Diagnosis not present

## 2023-05-11 DIAGNOSIS — E785 Hyperlipidemia, unspecified: Secondary | ICD-10-CM | POA: Diagnosis not present

## 2023-05-11 DIAGNOSIS — Z7984 Long term (current) use of oral hypoglycemic drugs: Secondary | ICD-10-CM | POA: Diagnosis not present

## 2023-05-14 ENCOUNTER — Encounter: Payer: Self-pay | Admitting: Physician Assistant

## 2023-05-14 ENCOUNTER — Ambulatory Visit (INDEPENDENT_AMBULATORY_CARE_PROVIDER_SITE_OTHER): Payer: 59 | Admitting: Physician Assistant

## 2023-05-14 VITALS — BP 124/80 | HR 68 | Temp 97.1°F | Ht 68.0 in | Wt 144.4 lb

## 2023-05-14 DIAGNOSIS — Z0000 Encounter for general adult medical examination without abnormal findings: Secondary | ICD-10-CM | POA: Diagnosis not present

## 2023-05-14 DIAGNOSIS — E1165 Type 2 diabetes mellitus with hyperglycemia: Secondary | ICD-10-CM

## 2023-05-14 DIAGNOSIS — E785 Hyperlipidemia, unspecified: Secondary | ICD-10-CM | POA: Diagnosis not present

## 2023-05-14 LAB — CBC WITH DIFFERENTIAL/PLATELET
Basophils Absolute: 0.1 10*3/uL (ref 0.0–0.1)
Basophils Relative: 1.1 % (ref 0.0–3.0)
Eosinophils Absolute: 0.2 10*3/uL (ref 0.0–0.7)
Eosinophils Relative: 2.8 % (ref 0.0–5.0)
HCT: 41.4 % (ref 39.0–52.0)
Hemoglobin: 12.8 g/dL — ABNORMAL LOW (ref 13.0–17.0)
Lymphocytes Relative: 38.3 % (ref 12.0–46.0)
Lymphs Abs: 3 10*3/uL (ref 0.7–4.0)
MCHC: 30.8 g/dL (ref 30.0–36.0)
MCV: 67.8 fl — ABNORMAL LOW (ref 78.0–100.0)
Monocytes Absolute: 0.6 10*3/uL (ref 0.1–1.0)
Monocytes Relative: 7.4 % (ref 3.0–12.0)
Neutro Abs: 3.9 10*3/uL (ref 1.4–7.7)
Neutrophils Relative %: 50.4 % (ref 43.0–77.0)
Platelets: 208 10*3/uL (ref 150.0–400.0)
RBC: 6.11 Mil/uL — ABNORMAL HIGH (ref 4.22–5.81)
RDW: 15.3 % (ref 11.5–15.5)
WBC: 7.8 10*3/uL (ref 4.0–10.5)

## 2023-05-14 NOTE — Progress Notes (Signed)
Subjective:    Joe Boyd is a 47 y.o. male and is here for a comprehensive physical exam.  HPI  There are no preventive care reminders to display for this patient.   Acute Concerns: No acute concerns discussed today.  Chronic Issues: DM Patient reports that he doesn't use freestyle libre due to expensive cost. He is complaint with daily 50-100 mg janumet XR, acarbose 100 mg daily, glimiperide 1 mg tablet daily with no issues.  Hyperlipidemia Patient is compliant with 10 mg lipitor with no issues.  Health Maintenance: Colonoscopy -- last completed on 11/28/2019. PSA -- no family hx Lab Results  Component Value Date   PSA 0.33 06/13/2021   Diet -- He maintains a fair diet. Sleep habits -- He has no trouble sleeping. Exercise -- He participates in regular exercise by walking his dog.  Weight -- stable Recent weight history Wt Readings from Last 10 Encounters:  05/14/23 144 lb 6.1 oz (65.5 kg)  02/22/23 148 lb (67.1 kg)  11/27/22 158 lb 6.4 oz (71.8 kg)  07/16/22 152 lb (68.9 kg)  05/12/22 158 lb 4 oz (71.8 kg)  04/13/22 158 lb 9.6 oz (71.9 kg)  01/12/22 155 lb 9.6 oz (70.6 kg)  10/14/21 153 lb (69.4 kg)  09/09/21 156 lb 3.2 oz (70.9 kg)  06/13/21 152 lb 9.6 oz (69.2 kg)   Body mass index is 21.95 kg/m.  Mood -- He is in a stable mood today. Alcohol use --  reports current alcohol use.  Tobacco use --  Tobacco Use: Low Risk  (05/14/2023)   Patient History    Smoking Tobacco Use: Never    Smokeless Tobacco Use: Never    Passive Exposure: Not on file    Eligible for Low Dose CT? Not due  UTD with eye doctor? He is UTD on vision care. UTD with dentist? He is not UTD on dental care.     05/14/2023    1:27 PM  Depression screen PHQ 2/9  Decreased Interest 0  Down, Depressed, Hopeless 0  PHQ - 2 Score 0    Other providers/specialists: Patient Care Team: Jarold Motto, [place] as PCP - General (Physician Assistant) Reather Littler, MD as Consulting  Physician (Endocrinology) Luciana Axe Alford Highland, MD as Consulting Physician (Ophthalmology)    PMHx, SurgHx, SocialHx, Medications, and Allergies were reviewed in the Visit Navigator and updated as appropriate.   Past Medical History:  Diagnosis Date   Diabetes mellitus without complication (HCC)    Tuberculosis    Finished Rx in 8/15    No past surgical history on file.   Family History  Problem Relation Age of Onset   Diabetes Mother    Hypertension Mother    Thyroid disease Mother    Heart disease Mother    Diabetes Father    Hypertension Father    Heart disease Father    Breast cancer Sister        64s   Heart disease Paternal Uncle    Cancer Neg Hx    Colon cancer Neg Hx     Social History   Tobacco Use   Smoking status: Never   Smokeless tobacco: Never  Vaping Use   Vaping Use: Never used  Substance Use Topics   Alcohol use: Yes    Comment: occasionally   Drug use: No    Review of Systems:   Review of Systems  Constitutional:  Negative for chills, fever, malaise/fatigue and weight loss.  HENT:  Negative for hearing loss,  sinus pain and sore throat.   Respiratory:  Negative for cough and hemoptysis.   Cardiovascular:  Negative for chest pain, palpitations, leg swelling and PND.  Gastrointestinal:  Negative for abdominal pain, constipation, diarrhea, heartburn, nausea and vomiting.  Genitourinary:  Negative for dysuria, frequency and urgency.  Musculoskeletal:  Negative for back pain, myalgias and neck pain.  Skin:  Negative for itching and rash.  Endo/Heme/Allergies:  Negative for polydipsia.  Psychiatric/Behavioral:  Negative for depression. The patient is not nervous/anxious.     Objective:    Vitals:   05/14/23 1324  BP: 124/80  Pulse: 68  Temp: (!) 97.1 F (36.2 C)  SpO2: 96%    Body mass index is 21.95 kg/m.  General  Alert, cooperative, no distress, appears stated age  Head:  Normocephalic, without obvious abnormality, atraumatic  Eyes:   PERRL, conjunctiva/corneas clear, EOM's intact, fundi benign, both eyes       Ears:  Normal TM's and external ear canals, both ears  Nose: Nares normal, septum midline, mucosa normal, no drainage or sinus tenderness  Throat: Lips, mucosa, and tongue normal; teeth and gums normal  Neck: Supple, symmetrical, trachea midline, no adenopathy;     thyroid:  No enlargement/tenderness/nodules; no carotid bruit or JVD  Back:   Symmetric, no curvature, ROM normal, no CVA tenderness  Lungs:   Clear to auscultation bilaterally, respirations unlabored  Chest wall:  No tenderness or deformity  Heart:  Regular rate and rhythm, S1 and S2 normal, no murmur, rub or gallop  Abdomen:   Soft, non-tender, bowel sounds active all four quadrants, no masses, no organomegaly  Extremities: Extremities normal, atraumatic, no cyanosis or edema  Prostate : Deferred   Skin: Skin color, texture, turgor normal, no rashes or lesions  Lymph nodes: Cervical, supraclavicular, and axillary nodes normal  Neurologic: CNII-XII grossly intact. Normal strength, sensation and reflexes throughout   AssessmentPlan:   Routine physical examination Today patient counseled on age appropriate routine health concerns for screening and prevention, each reviewed and up to date or declined. Immunizations reviewed and up to date or declined. Labs ordered and reviewed. Risk factors for depression reviewed and negative. Hearing function and visual acuity are intact. ADLs screened and addressed as needed. Functional ability and level of safety reviewed and appropriate. Education, counseling and referrals performed based on assessed risks today. Patient provided with a copy of personalized plan for preventive services.  Type 2 diabetes mellitus with hyperglycemia, without long-term current use of insulin (HCC) Management per endo Reviewed notes He is doing well overall Continue excellent compliance  Hyperlipidemia, unspecified hyperlipidemia  type Endo managing Continue compliance with Lipitor 10 mg daily  I,Verona Buck,acting as a Neurosurgeon for [employer], PA.,have documented all relevant documentation on the behalf of Jarold Motto, PA,as directed by  Jarold Motto, PA while in the presence of Jarold Motto, [place].  I, Jarold Motto, [place], have reviewed all documentation for this visit. The documentation on 05/14/23 for the exam, diagnosis, procedures, and orders are all accurate and complete.  Jarold Motto, [name] [facility]

## 2023-05-14 NOTE — Patient Instructions (Signed)
It was great to see you! ? ?Please go to the lab for blood work.  ? ?Our office will call you with your results unless you have chosen to receive results via MyChart. ? ?If your blood work is normal we will follow-up each year for physicals and as scheduled for chronic medical problems. ? ?If anything is abnormal we will treat accordingly and get you in for a follow-up. ? ?Take care, ? ?Lakethia Coppess ?  ? ? ?

## 2023-05-17 ENCOUNTER — Encounter: Payer: Self-pay | Admitting: Physician Assistant

## 2023-05-18 ENCOUNTER — Other Ambulatory Visit: Payer: Self-pay | Admitting: Physician Assistant

## 2023-05-18 DIAGNOSIS — R710 Precipitous drop in hematocrit: Secondary | ICD-10-CM

## 2023-05-19 ENCOUNTER — Other Ambulatory Visit (INDEPENDENT_AMBULATORY_CARE_PROVIDER_SITE_OTHER): Payer: 59

## 2023-05-19 DIAGNOSIS — R710 Precipitous drop in hematocrit: Secondary | ICD-10-CM

## 2023-05-20 DIAGNOSIS — S93491A Sprain of other ligament of right ankle, initial encounter: Secondary | ICD-10-CM | POA: Diagnosis not present

## 2023-05-20 LAB — IBC + FERRITIN
Ferritin: 18.8 ng/mL — ABNORMAL LOW (ref 22.0–322.0)
Iron: 76 ug/dL (ref 42–165)
Saturation Ratios: 19 % — ABNORMAL LOW (ref 20.0–50.0)
TIBC: 400.4 ug/dL (ref 250.0–450.0)
Transferrin: 286 mg/dL (ref 212.0–360.0)

## 2023-05-20 LAB — VITAMIN B12: Vitamin B-12: 542 pg/mL (ref 211–911)

## 2023-05-21 ENCOUNTER — Ambulatory Visit (INDEPENDENT_AMBULATORY_CARE_PROVIDER_SITE_OTHER): Payer: 59

## 2023-05-21 DIAGNOSIS — R710 Precipitous drop in hematocrit: Secondary | ICD-10-CM

## 2023-05-21 LAB — FECAL OCCULT BLOOD, IMMUNOCHEMICAL: Fecal Occult Bld: NEGATIVE

## 2023-05-26 LAB — HEMOGLOBINOPATHY EVALUATION
Fetal Hemoglobin Testing: <1 % (ref 0.0–1.9)
HCT: 41.9 % (ref 38.5–50.0)
Hemoglobin A2 - HGBRFX: 2.2 % (ref 2.0–3.2)
Hemoglobin: 12.2 g/dL — ABNORMAL LOW (ref 13.2–17.1)
Hgb A: 97.8 % (ref >96.0)
MCH: 20.9 pg — ABNORMAL LOW (ref 27.0–33.0)
MCV: 71.6 fL — ABNORMAL LOW (ref 80.0–100.0)
RBC: 5.85 10*6/uL — ABNORMAL HIGH (ref 4.20–5.80)
RDW: 15.4 % — ABNORMAL HIGH (ref 11.0–15.0)

## 2023-05-27 ENCOUNTER — Other Ambulatory Visit: Payer: 59

## 2023-05-28 ENCOUNTER — Other Ambulatory Visit (INDEPENDENT_AMBULATORY_CARE_PROVIDER_SITE_OTHER): Payer: 59

## 2023-05-28 ENCOUNTER — Encounter: Payer: Self-pay | Admitting: Physician Assistant

## 2023-05-28 DIAGNOSIS — E1165 Type 2 diabetes mellitus with hyperglycemia: Secondary | ICD-10-CM

## 2023-05-28 DIAGNOSIS — E7800 Pure hypercholesterolemia, unspecified: Secondary | ICD-10-CM | POA: Diagnosis not present

## 2023-05-28 LAB — COMPREHENSIVE METABOLIC PANEL WITH GFR
ALT: 35 U/L (ref 0–53)
AST: 27 U/L (ref 0–37)
Albumin: 4.8 g/dL (ref 3.5–5.2)
Alkaline Phosphatase: 76 U/L (ref 39–117)
BUN: 17 mg/dL (ref 6–23)
CO2: 32 meq/L (ref 19–32)
Calcium: 10.4 mg/dL (ref 8.4–10.5)
Chloride: 102 meq/L (ref 96–112)
Creatinine, Ser: 1.32 mg/dL (ref 0.40–1.50)
GFR: 63.55 mL/min
Glucose, Bld: 95 mg/dL (ref 70–99)
Potassium: 5.4 meq/L — ABNORMAL HIGH (ref 3.5–5.1)
Sodium: 141 meq/L (ref 135–145)
Total Bilirubin: 1 mg/dL (ref 0.2–1.2)
Total Protein: 8.2 g/dL (ref 6.0–8.3)

## 2023-05-28 LAB — MICROALBUMIN / CREATININE URINE RATIO
Creatinine,U: 152.2 mg/dL
Microalb Creat Ratio: 0.5 mg/g (ref 0.0–30.0)
Microalb, Ur: 0.7 mg/dL (ref 0.0–1.9)

## 2023-05-28 LAB — LIPID PANEL
Cholesterol: 105 mg/dL (ref 0–200)
HDL: 38.9 mg/dL — ABNORMAL LOW
LDL Cholesterol: 46 mg/dL (ref 0–99)
NonHDL: 65.94
Total CHOL/HDL Ratio: 3
Triglycerides: 101 mg/dL (ref 0.0–149.0)
VLDL: 20.2 mg/dL (ref 0.0–40.0)

## 2023-05-28 LAB — HEMOGLOBIN A1C: Hgb A1c MFr Bld: 6.6 % — ABNORMAL HIGH (ref 4.6–6.5)

## 2023-05-31 ENCOUNTER — Encounter: Payer: Self-pay | Admitting: Endocrinology

## 2023-05-31 ENCOUNTER — Ambulatory Visit: Payer: 59 | Admitting: Endocrinology

## 2023-05-31 ENCOUNTER — Ambulatory Visit: Payer: 59 | Admitting: Physician Assistant

## 2023-05-31 ENCOUNTER — Encounter: Payer: Self-pay | Admitting: Physician Assistant

## 2023-05-31 VITALS — BP 120/80 | HR 62 | Ht 68.0 in | Wt 146.0 lb

## 2023-05-31 VITALS — BP 124/80 | HR 54 | Temp 97.7°F | Ht 68.0 in | Wt 146.0 lb

## 2023-05-31 DIAGNOSIS — R351 Nocturia: Secondary | ICD-10-CM | POA: Diagnosis not present

## 2023-05-31 DIAGNOSIS — E7800 Pure hypercholesterolemia, unspecified: Secondary | ICD-10-CM

## 2023-05-31 DIAGNOSIS — E1165 Type 2 diabetes mellitus with hyperglycemia: Secondary | ICD-10-CM | POA: Diagnosis not present

## 2023-05-31 DIAGNOSIS — R229 Localized swelling, mass and lump, unspecified: Secondary | ICD-10-CM

## 2023-05-31 DIAGNOSIS — E875 Hyperkalemia: Secondary | ICD-10-CM | POA: Diagnosis not present

## 2023-05-31 DIAGNOSIS — W57XXXA Bitten or stung by nonvenomous insect and other nonvenomous arthropods, initial encounter: Secondary | ICD-10-CM

## 2023-05-31 DIAGNOSIS — Z7984 Long term (current) use of oral hypoglycemic drugs: Secondary | ICD-10-CM | POA: Diagnosis not present

## 2023-05-31 LAB — URINALYSIS, ROUTINE W REFLEX MICROSCOPIC
Bilirubin Urine: NEGATIVE
Hgb urine dipstick: NEGATIVE
Ketones, ur: NEGATIVE
Leukocytes,Ua: NEGATIVE
Nitrite: NEGATIVE
RBC / HPF: NONE SEEN (ref 0–?)
Specific Gravity, Urine: >=1.03 — AB (ref 1.000–1.030)
Total Protein, Urine: NEGATIVE
Urine Glucose: NEGATIVE
Urobilinogen, UA: 0.2 (ref 0.0–1.0)
pH: 5.5 (ref 5.0–8.0)

## 2023-05-31 LAB — GLUCOSE, POCT (MANUAL RESULT ENTRY): POC Glucose: 94 mg/dl (ref 70–99)

## 2023-05-31 NOTE — Patient Instructions (Signed)
It was great to see you!  Start topical anti-itch (hydrocortisone ointment) OTC (available over the counter without a prescription) for your legs Trial benadryl at night or claritin/allegra/zrtec during the day  If worsening of lump on neck or any NEW symptoms -- pain, fever, chills, neck stiffness, night sweats let us know ASAP If lump on neck persists > 4 weeks, let us know and we will get ultrasound  We will check your urine today  Take care,  Jarold Motto [name]

## 2023-05-31 NOTE — Progress Notes (Signed)
Joe Boyd is a 47 y.o. male here for a new problem.  History of Present Illness:   Chief Complaint  Patient presents with   Cyst    Pt c/o knot on right side of neck, x 4 days. Also another spot on neck thinks bug bite. Denies fever or chills.    HPI  Knot on neck, right: He reports a knot on the right side of his neck starting 4 days ago.  He endorses associated neck pain and tenderness.  Has not changed in size since he noticed it.  Denies any chills or fever.   Urinary frequency: He also complains of lower abdominal pain.  Denies any constipation, cough, night sweats, or recent travel.  No concern for STDs.  Bumps on legs He reports multiple bumps on bilateral lower extremities and endorses itchiness and redness.  He is unsure of what could have caused these bumps or if they are insect bites.   Past Medical History:  Diagnosis Date   Diabetes mellitus without complication (HCC)    Tuberculosis    Finished Rx in 8/15     Social History   Tobacco Use   Smoking status: Never   Smokeless tobacco: Never  Vaping Use   Vaping Use: Never used  Substance Use Topics   Alcohol use: Yes    Comment: occasionally   Drug use: No    No past surgical history on file.  Family History  Problem Relation Age of Onset   Diabetes Mother    Hypertension Mother    Thyroid disease Mother    Heart disease Mother    Diabetes Father    Hypertension Father    Heart disease Father    Breast cancer Sister        66s   Heart disease Paternal Uncle    Cancer Neg Hx    Colon cancer Neg Hx     Allergies  Allergen Reactions   Augmentin [Amoxicillin-Pot Clavulanate] Itching   Diclofenac     Abnormal liver functions    Current Medications:   Current Outpatient Medications:    acarbose (PRECOSE) 50 MG tablet, Take 2 tablet by mouth daily at lunch.  May also take 1 tab at breakfast and dinner if eating, Disp: 270 tablet, Rfl: 3   albuterol (VENTOLIN HFA) 108 (90 Base)  MCG/ACT inhaler, Inhale 2 puffs into the lungs every 6 (six) hours as needed for wheezing or shortness of breath., Disp: 18 g, Rfl: 2   atorvastatin (LIPITOR) 10 MG tablet, Take 1 tablet (10 mg total) by mouth daily., Disp: 90 tablet, Rfl: 3   Blood Glucose Monitoring Suppl (ONETOUCH VERIO SYNC SYSTEM) w/Device KIT, Use to check blood sugars daily-Dx code E11.65, Disp: 1 kit, Rfl: 1   clotrimazole (LOTRIMIN) 1 % cream, Apply 1 application topically 2 (two) times daily., Disp: 30 g, Rfl: 0   Continuous Blood Gluc Sensor (FREESTYLE LIBRE 3 SENSOR) MISC, 1 Device by Does not apply route every 14 (fourteen) days. Apply 1 sensor on upper arm every 14 days for continuous glucose monitoring, Disp: 2 each, Rfl: 2   glimepiride (AMARYL) 1 MG tablet, Take 1 tablet by mouth once a day at bedtime, Disp: 90 tablet, Rfl: 1   glucose blood (ONETOUCH VERIO) test strip, Use to check blood sugars daily-Dx code E11.65, Disp: 100 each, Rfl: 1   Multiple Vitamin (MULTIVITAMIN WITH MINERALS) TABS tablet, Take 1 tablet by mouth daily. , Disp: , Rfl:    OneTouch Delica Lancets 30G  MISC, Use to check blood sugars daily-Dx code E11.65, Disp: 100 each, Rfl: 1   SitaGLIPtin-MetFORMIN HCl (JANUMET XR) 50-500 MG TB24, 1 tablet daily, Disp: 30 tablet, Rfl: 3   Review of Systems:   Review of Systems  Gastrointestinal:  Positive for abdominal pain.  Musculoskeletal:  Positive for neck pain (right sided pain/tenderness -- see HPI).  Skin:  Positive for itching (see HPI).    Vitals:   Vitals:   05/31/23 1354  BP: 124/80  Pulse: (!) 54  Temp: 97.7 F (36.5 C)  TempSrc: Temporal  SpO2: 99%  Weight: 146 lb (66.2 kg)  Height: 5\' 8"  (1.727 m)     Body mass index is 22.2 kg/m.  Physical Exam:   Physical Exam Vitals and nursing note reviewed.  Constitutional:      Appearance: He is well-developed.  HENT:     Head: Normocephalic.  Eyes:     Conjunctiva/sclera: Conjunctivae normal.     Pupils: Pupils are equal,  round, and reactive to light.  Neck:     Comments: Midline of posterior neck with small erythematous papule without tenderness to palpation, induration or fluctuance Pulmonary:     Effort: Pulmonary effort is normal.  Abdominal:     General: Abdomen is flat. Bowel sounds are normal.     Palpations: Abdomen is soft.     Tenderness: There is no abdominal tenderness.  Musculoskeletal:        General: Normal range of motion.     Cervical back: Normal range of motion.  Lymphadenopathy:     Cervical: Cervical adenopathy present.     Right cervical: Posterior cervical adenopathy present.  Skin:    General: Skin is warm and dry.     Comments: Scattered eythematous papules to b/l lower legs with excoriations  Neurological:     Mental Status: He is alert and oriented to person, place, and time.  Psychiatric:        Behavior: Behavior normal.        Thought Content: Thought content normal.        Judgment: Judgment normal.    No results found for any visits on 05/31/23.  Assessment and Plan:   Nocturia Resolved Will update UA today to make sure no abnormalities Further work-up based on results and clinical sx  Bug bite, initial encounter Unclear etiology Recommend topical hydrocortisone Consider antihistamine If worsening, reach out to [place] No indication for antibiotic(s) at this time  Localized skin mass, lump, or swelling Suspect possible LAD Recommend watchful waiting If any clinical symptom(s) or worsening, reach out If area present > 4 weeks, needs to reach out and we will obtain imaging   I,Rachel Rivera,acting as a scribe for Jarold Motto, PA.,have documented all relevant documentation on the behalf of Jarold Motto, PA,as directed by  Jarold Motto, PA while in the presence of Jarold Motto, [place].  I, Jarold Motto, [place], have reviewed all documentation for this visit. The documentation on 05/31/23 for the exam, diagnosis, procedures, and orders are all accurate  and complete.   Jarold Motto, [name]

## 2023-05-31 NOTE — Progress Notes (Signed)
Patient ID: Joe Boyd, male   DOB: 10/23/1976, 47 y.o.   MRN: [id number]           Reason for Appointment: Follow-up visit   History of Present Illness:          Diagnosis: Type 2 diabetes mellitus, date of diagnosis: 2012        Past history:  At time of diagnosis he was having symptoms of increased thirst and urination and he was seen by his physician when he started feeling weak and dizzy also. His A1c at diagnosis was 12.8. He thinks he was treated with metformin only and also he started changing his diet along with eliminating sweet soft drinks.  Initially was treated with only 500 mg of metformin ER and in 2014 this was increased to 1000 mg.  He has not had any other medications for diabetes. He was last seen by his endocrinologist in [place] in 07/2014 when his A1c was 6.5 and no changes were made A1c of over 7%  in 7/16 and he was started on Kombiglyze XR In 6/17 with his A1c going up to 7.6 he was given Glyset 25 mg at lunch also in addition to [employer]  Recent history:   A1c is back down to 6.6 compared to 8.1 last year       Oral hypoglycemic drugs :  Acarbose 50 mg at breakfast and 100 mg at lunch, Janumet XR 50/500 once a day, Amaryl 1 mg at bedtime  Current management, blood sugar patterns and problems identified: His control is somewhat better with using Janumet compared to previous drugs Has difficulty getting adequate coverage for some of his medications including Rybelsus Control appears to be still good even with taking 1 tablet daily of the Janumet 50/500 in the evening He has no GI side effects with Janumet compared to metformin by itself He is walking with his dog regularly now Also maintaining lower carbohydrate and low-fat diet with no excessive snacks Has maintained his weight Also taking his Precose consistently and his largest meal is either breakfast or lunch       Side effects from medications have been: None   Glucose monitoring:  done  occasionally, has not done any readings for 6 weeks  Using   glucometer: One Touch Verio  Self-care:  Meals: Usually eating very little at dinnertime Tries to get some protein with his lunch, sometimes yogurt, sometimes has chicken.    Usually avoiding snacks               Dietician visit, most recent: 7/16                Weight history:   Wt Readings from Last 3 Encounters:  05/31/23 146 lb (66.2 kg)  05/31/23 146 lb (66.2 kg)  05/14/23 144 lb 6.1 oz (65.5 kg)    Glycemic control:   Lab Results  Component Value Date   HGBA1C 6.6 (H) 05/28/2023   HGBA1C 6.9 (H) 02/17/2023   HGBA1C 8.1 (H) 11/23/2022   Lab Results  Component Value Date   MICROALBUR 0.7 05/28/2023   LDLCALC 46 05/28/2023   CREATININE 1.32 05/28/2023   Office Visit on 05/31/2023  Component Date Value Ref Range Status   POC Glucose 05/31/2023 94  70 - 99 mg/dl Final  Lab on 47/82/9562  Component Date Value Ref Range Status   Sodium 05/28/2023 141  135 - 145 mEq/L Final   Potassium 05/28/2023 5.4 No hemolysis seen (H)  3.5 -  5.1 mEq/L Final   Chloride 05/28/2023 102  96 - 112 mEq/L Final   CO2 05/28/2023 32  19 - 32 mEq/L Final   Glucose, Bld 05/28/2023 95  70 - 99 mg/dL Final   BUN 16/09/9603 17  6 - 23 mg/dL Final   Creatinine, Ser 05/28/2023 1.32  0.40 - 1.50 mg/dL Final   Total Bilirubin 05/28/2023 1.0  0.2 - 1.2 mg/dL Final   Alkaline Phosphatase 05/28/2023 76  39 - 117 U/L Final   AST 05/28/2023 27  0 - 37 U/L Final   ALT 05/28/2023 35  0 - 53 U/L Final   Total Protein 05/28/2023 8.2  6.0 - 8.3 g/dL Final   Albumin 54/08/8118 4.8  3.5 - 5.2 g/dL Final   GFR 14/78/2956 63.55  >60.00 mL/min Final   Calculated using the CKD-EPI Creatinine Equation (2021)   Calcium 05/28/2023 10.4  8.4 - 10.5 mg/dL Final   Cholesterol 21/30/8657 105  0 - 200 mg/dL Final   ATP III Classification       Desirable:  < 200 mg/dL               Borderline High:  200 - 239 mg/dL          High:  > = 846 mg/dL    Triglycerides 96/29/5284 101.0  0.0 - 149.0 mg/dL Final   Normal:  <132 mg/dLBorderline High:  150 - 199 mg/dL   HDL 44/12/270 53.66 (L)  >44.03 mg/dL Final   VLDL 47/42/5956 20.2  0.0 - 40.0 mg/dL Final   LDL Cholesterol 05/28/2023 46  0 - 99 mg/dL Final   Total CHOL/HDL Ratio 05/28/2023 3   Final                  Men          Women1/2 Average Risk     3.4          3.3Average Risk          5.0          4.42X Average Risk          9.6          7.13X Average Risk          15.0          11.0                       NonHDL 05/28/2023 65.94   Final   NOTE:  Non-HDL goal should be 30 mg/dL higher than patient's LDL goal (i.e. LDL goal of < 70 mg/dL, would have non-HDL goal of < 100 mg/dL)   Microalb, Ur 38/75/6433 0.7  0.0 - 1.9 mg/dL Final   Creatinine,U 29/51/8841 152.2  mg/dL Final   Microalb Creat Ratio 05/28/2023 0.5  0.0 - 30.0 mg/g Final   Hgb A1c MFr Bld 05/28/2023 6.6 (H)  4.6 - 6.5 % Final   Glycemic Control Guidelines for People with Diabetes:Non Diabetic:  <6%Goal of Therapy: <7%Additional Action Suggested:  >8%       Allergies as of 05/31/2023       Reactions   Augmentin [amoxicillin-pot Clavulanate] Itching   Diclofenac    Abnormal liver functions        Medication List        Accurate as of May 31, 2023  3:39 PM. If you have any questions, ask your nurse or doctor.          acarbose  50 MG tablet Commonly known as: PRECOSE Take 2 tablet by mouth daily at lunch.  May also take 1 tab at breakfast and dinner if eating   albuterol 108 (90 Base) MCG/ACT inhaler Commonly known as: VENTOLIN HFA Inhale 2 puffs into the lungs every 6 (six) hours as needed for wheezing or shortness of breath.   atorvastatin 10 MG tablet Commonly known as: LIPITOR Take 1 tablet (10 mg total) by mouth daily.   clotrimazole 1 % cream Commonly known as: LOTRIMIN Apply 1 application topically 2 (two) times daily.   FreeStyle Libre 3 Sensor Misc 1 Device by Does not apply route every 14  (fourteen) days. Apply 1 sensor on upper arm every 14 days for continuous glucose monitoring   glimepiride 1 MG tablet Commonly known as: AMARYL Take 1 tablet by mouth once a day at bedtime   Janumet XR 50-500 MG Tb24 Generic drug: SitaGLIPtin-MetFORMIN HCl 1 tablet daily   multivitamin with minerals Tabs tablet Take 1 tablet by mouth daily.   OneTouch Delica Lancets 30G Misc Use to check blood sugars daily-Dx code E11.65   OneTouch Verio Sync System w/Device Kit Use to check blood sugars daily-Dx code E11.65   OneTouch Verio test strip Generic drug: glucose blood Use to check blood sugars daily-Dx code E11.65        Allergies:  Allergies  Allergen Reactions   Augmentin [Amoxicillin-Pot Clavulanate] Itching   Diclofenac     Abnormal liver functions    Past Medical History:  Diagnosis Date   Diabetes mellitus without complication (HCC)    Tuberculosis    Finished Rx in 8/15    No past surgical history on file.  Family History  Problem Relation Age of Onset   Diabetes Mother    Hypertension Mother    Thyroid disease Mother    Heart disease Mother    Diabetes Father    Hypertension Father    Heart disease Father    Breast cancer Sister        8s   Heart disease Paternal Uncle    Cancer Neg Hx    Colon cancer Neg Hx     Social History:  reports that he has never smoked. He has never used smokeless tobacco. He reports current alcohol use. He reports that he does not use drugs.    Review of Systems        Lipids:  LDL and triglycerides have been variable Started on atorvastatin 10 mg daily in 4/21 because of high LDL and cardiovascular risk factors LDL is controlled although HDL slightly low around 39       Lab Results  Component Value Date   CHOL 105 05/28/2023   CHOL 101 11/23/2022   CHOL 107 01/08/2022   Lab Results  Component Value Date   HDL 38.90 (L) 05/28/2023   HDL 37.30 (L) 11/23/2022   HDL 39.70 01/08/2022   Lab Results   Component Value Date   LDLCALC 46 05/28/2023   LDLCALC 46 11/23/2022   LDLCALC 46 01/08/2022   Lab Results  Component Value Date   TRIG 101.0 05/28/2023   TRIG 88.0 11/23/2022   TRIG 106.0 01/08/2022   Lab Results  Component Value Date   CHOLHDL 3 05/28/2023   CHOLHDL 3 11/23/2022   CHOLHDL 3 01/08/2022   No results found for: "LDLDIRECT"                 Thyroid: History of a small goiter with normal TSH  Lab Results  Component Value Date   TSH 0.98 10/04/2019   No history of hypertension  BP Readings from Last 3 Encounters:  05/31/23 120/80  05/31/23 124/80  05/14/23 124/80    HYPERKALEMIA:  His potassium had been periodically high at times without obvious cause Following low potassium diet, not eating bananas and rarely eating much citrus fruit, not on potassium supplements Potassium is high again  Lab Results  Component Value Date   K 5.4 No hemolysis seen (H) 05/28/2023    HYPERCALCEMIA: He appears to have had high normal or high calcium levels, no history of kidney stones or renal dysfunction Previous normal  He was told to stop his calcium supplement when calcium was high in 10/21 Calcium consistently upper normal  Lab Results  Component Value Date   CALCIUM 10.4 05/28/2023   CALCIUM 10.5 02/17/2023   CALCIUM 9.6 11/23/2022   CALCIUM 10.0 07/13/2022   CALCIUM 10.1 04/06/2022   CALCIUM 10.0 01/08/2022   Lab Results  Component Value Date   PTH 15 12/17/2020   CALCIUM 10.4 05/28/2023   No symptoms of neuropathy in feet or tingling   Most recent eye exam showed mild unilateral background retinopathy  LABS:  Office Visit on 05/31/2023  Component Date Value Ref Range Status   POC Glucose 05/31/2023 94  70 - 99 mg/dl Final  Lab on 16/09/9603  Component Date Value Ref Range Status   Sodium 05/28/2023 141  135 - 145 mEq/L Final   Potassium 05/28/2023 5.4 No hemolysis seen (H)  3.5 - 5.1 mEq/L Final   Chloride 05/28/2023 102  96 - 112  mEq/L Final   CO2 05/28/2023 32  19 - 32 mEq/L Final   Glucose, Bld 05/28/2023 95  70 - 99 mg/dL Final   BUN 54/08/8118 17  6 - 23 mg/dL Final   Creatinine, Ser 05/28/2023 1.32  0.40 - 1.50 mg/dL Final   Total Bilirubin 05/28/2023 1.0  0.2 - 1.2 mg/dL Final   Alkaline Phosphatase 05/28/2023 76  39 - 117 U/L Final   AST 05/28/2023 27  0 - 37 U/L Final   ALT 05/28/2023 35  0 - 53 U/L Final   Total Protein 05/28/2023 8.2  6.0 - 8.3 g/dL Final   Albumin 14/78/2956 4.8  3.5 - 5.2 g/dL Final   GFR 21/30/8657 63.55  >60.00 mL/min Final   Calculated using the CKD-EPI Creatinine Equation (2021)   Calcium 05/28/2023 10.4  8.4 - 10.5 mg/dL Final   Cholesterol 84/69/6295 105  0 - 200 mg/dL Final   ATP III Classification       Desirable:  < 200 mg/dL               Borderline High:  200 - 239 mg/dL          High:  > = 284 mg/dL   Triglycerides 13/24/4010 101.0  0.0 - 149.0 mg/dL Final   Normal:  <272 mg/dLBorderline High:  150 - 199 mg/dL   HDL 53/66/4403 47.42 (L)  >59.56 mg/dL Final   VLDL 38/75/6433 20.2  0.0 - 40.0 mg/dL Final   LDL Cholesterol 05/28/2023 46  0 - 99 mg/dL Final   Total CHOL/HDL Ratio 05/28/2023 3   Final                  Men          Women1/2 Average Risk     3.4          3.3Average Risk  5.0          4.42X Average Risk          9.6          7.13X Average Risk          15.0          11.0                       NonHDL 05/28/2023 65.94   Final   NOTE:  Non-HDL goal should be 30 mg/dL higher than patient's LDL goal (i.e. LDL goal of < 70 mg/dL, would have non-HDL goal of < 100 mg/dL)   Microalb, Ur 16/09/9603 0.7  0.0 - 1.9 mg/dL Final   Creatinine,U 54/08/8118 152.2  mg/dL Final   Microalb Creat Ratio 05/28/2023 0.5  0.0 - 30.0 mg/g Final   Hgb A1c MFr Bld 05/28/2023 6.6 (H)  4.6 - 6.5 % Final   Glycemic Control Guidelines for People with Diabetes:Non Diabetic:  <6%Goal of Therapy: <7%Additional Action Suggested:  >8%     Physical Examination:  BP 120/80   Pulse 62    Ht 5\' 8"  (1.727 m)   Wt 146 lb (66.2 kg)   SpO2 97%   BMI 22.20 kg/m     ASSESSMENT:   Diabetes type 2 , nonobese  See history of present illness for detailed discussion of current diabetes management, blood sugar patterns and problems identified  His A1c is 6.6 and improved  Blood sugars are improved with using a combination of Janumet low-dose, Precose, Amaryl since his last visit He is watching his diet and walking regularly Blood sugar monitoring is again inadequate at home  PLAN:   Restart glucose monitoring especially after meals No change in medication regimen   Mild idiopathic HYPERKALEMIA  Will recheck potassium level today in the office to make sure it is not related to venipuncture technique  Follow-up in 4 months  There are no Patient Instructions on file for this visit.  Reather Littler 05/31/2023, 3:39 PM   Note: This office note was prepared with Dragon voice recognition system technology. Any transcriptional errors that result from this process are unintentional.

## 2023-06-01 LAB — POTASSIUM: Potassium: 4 mEq/L (ref 3.5–5.1)

## 2023-06-21 ENCOUNTER — Other Ambulatory Visit: Payer: Self-pay | Admitting: Endocrinology

## 2023-06-21 DIAGNOSIS — E1165 Type 2 diabetes mellitus with hyperglycemia: Secondary | ICD-10-CM

## 2023-07-07 DIAGNOSIS — E113293 Type 2 diabetes mellitus with mild nonproliferative diabetic retinopathy without macular edema, bilateral: Secondary | ICD-10-CM | POA: Diagnosis not present

## 2023-07-07 DIAGNOSIS — H40053 Ocular hypertension, bilateral: Secondary | ICD-10-CM | POA: Diagnosis not present

## 2023-07-07 LAB — HM DIABETES EYE EXAM

## 2023-08-18 ENCOUNTER — Other Ambulatory Visit (INDEPENDENT_AMBULATORY_CARE_PROVIDER_SITE_OTHER): Payer: 59

## 2023-08-18 DIAGNOSIS — E1165 Type 2 diabetes mellitus with hyperglycemia: Secondary | ICD-10-CM | POA: Diagnosis not present

## 2023-08-18 LAB — BASIC METABOLIC PANEL
BUN: 12 mg/dL (ref 6–23)
CO2: 32 mEq/L (ref 19–32)
Calcium: 9.8 mg/dL (ref 8.4–10.5)
Chloride: 105 mEq/L (ref 96–112)
Creatinine, Ser: 1.13 mg/dL (ref 0.40–1.50)
GFR: 76.46 mL/min (ref >60.00)
Glucose, Bld: 110 mg/dL — ABNORMAL HIGH (ref 70–99)
Potassium: 4.9 mEq/L (ref 3.5–5.1)
Sodium: 143 mEq/L (ref 135–145)

## 2023-08-18 LAB — HEMOGLOBIN A1C: Hgb A1c MFr Bld: 6.4 % (ref 4.6–6.5)

## 2023-08-19 ENCOUNTER — Ambulatory Visit: Payer: 59 | Admitting: Endocrinology

## 2023-08-19 ENCOUNTER — Encounter: Payer: Self-pay | Admitting: Endocrinology

## 2023-08-19 VITALS — BP 122/78 | HR 74 | Ht 68.0 in | Wt 146.6 lb

## 2023-08-19 DIAGNOSIS — E1169 Type 2 diabetes mellitus with other specified complication: Secondary | ICD-10-CM | POA: Diagnosis not present

## 2023-08-19 NOTE — Progress Notes (Signed)
Outpatient Endocrinology Note [place] Lynnley Doddridge, MD  08/20/23  Patient's Name: Joe Boyd    DOB: 24-Sep-1976    MRN: [id number]                                                    REASON OF VISIT: Follow up for type 2 diabetes mellitus  PCP: Jarold Motto, PA  HISTORY OF PRESENT ILLNESS:   Joe Boyd is a 47 y.o. old male with past medical history listed below, is here for follow up of type 2 diabetes mellitus.   Pertinent Diabetes History: He was diagnosed with type 2 diabetes mellitus in 2012.  Hemoglobin A1c at the time of diagnosis was 12.8%.  He was initially managed with metformin and lifestyle modification.  He used to be in a work area and moved to this area in 2014 /2015 timeframe.  Chronic Diabetes Complications : Retinopathy: yes/unilateral mild background retinopathy. Last ophthalmology exam was done on annually, reportedly. Nephropathy: no Peripheral neuropathy: no Coronary artery disease: no Stroke: no  Relevant comorbidities and cardiovascular risk factors: Obesity: no Body mass index is 22.29 kg/m.  Hypertension: yes Hyperlipidemia. Yes, on statin.   Current / Home Diabetic regimen includes: Acarbose 50 mg with breakfast and 100 mg at lunch. Janumet extended release 50/500 mg daily. Amaryl/glimepiride 1 mg at bedtime.  Prior diabetic medications: Kombiglyze Glyset  Glycemic data:   He has one touch glucometer at home.   Factors modifying glucose control: 1.  Diabetic diet assessment: usually two meals a day, larger meals breakfast or lunch, Light dinner if he eats.   2.  Staying active or exercising:   3.  Medication compliance: compliant all of the time.  # Thyroid: history of small goiter with normal TSH.   Interval history 08/20/23 He has not been checking blood sugar.  No glucometer data to review.  He has no complaints today.  He has been taking glimepiride at bedtime and advised to take with meal either at the breakfast or with  dinner.  Recent hemoglobin A1c 6.4%.  REVIEW OF SYSTEMS As per history of present illness.   PAST MEDICAL HISTORY: Past Medical History:  Diagnosis Date   Diabetes mellitus without complication (HCC)    Tuberculosis    Finished Rx in 8/15    PAST SURGICAL HISTORY: History reviewed. No pertinent surgical history.  ALLERGIES: Allergies  Allergen Reactions   Augmentin [Amoxicillin-Pot Clavulanate] Itching   Diclofenac     Abnormal liver functions    FAMILY HISTORY:  Family History  Problem Relation Age of Onset   Diabetes Mother    Hypertension Mother    Thyroid disease Mother    Heart disease Mother    Diabetes Father    Hypertension Father    Heart disease Father    Breast cancer Sister        50s   Heart disease Paternal Uncle    Cancer Neg Hx    Colon cancer Neg Hx     SOCIAL HISTORY: Social History   Socioeconomic History   Marital status: Divorced    Spouse name: Not on file   Number of children: 1   Years of education: 16   Highest education level: Not on file  Occupational History   Occupation: Ship broker: [employer]  Tobacco Use  Smoking status: Never   Smokeless tobacco: Never  Vaping Use   Vaping status: Never Used  Substance and Sexual Activity   Alcohol use: Yes    Comment: occasionally   Drug use: No   Sexual activity: Not on file  Other Topics Concern   Not on file  Social History Narrative   Born and raised in [place] until the age of 73. Parents live in [place]. Father is a Development worker, international aid. Divorced. Son is 20. 04/19/2017    Social Determinants of Health   Financial Resource Strain: Not on file  Food Insecurity: Not on file  Transportation Needs: Not on file  Physical Activity: Not on file  Stress: Not on file  Social Connections: Not on file    MEDICATIONS:  Current Outpatient Medications  Medication Sig Dispense Refill   acarbose (PRECOSE) 50 MG tablet Take 2 tablet by mouth daily at lunch.  May also take 1 tab  at breakfast and dinner if eating 270 tablet 3   atorvastatin (LIPITOR) 10 MG tablet Take 1 tablet (10 mg total) by mouth daily. 90 tablet 3   Blood Glucose Monitoring Suppl (ONETOUCH VERIO [facility] SYSTEM) w/Device KIT Use to check blood sugars daily-Dx code E11.65 1 kit 1   clotrimazole (LOTRIMIN) 1 % cream Apply 1 application topically 2 (two) times daily. 30 g 0   Continuous Blood Gluc Sensor (FREESTYLE LIBRE 3 SENSOR) MISC 1 Device by Does not apply route every 14 (fourteen) days. Apply 1 sensor on upper arm every 14 days for continuous glucose monitoring 2 each 2   glimepiride (AMARYL) 1 MG tablet TAKE 1 TABLET AT BEDTIME 30 tablet 2   glucose blood (ONETOUCH VERIO) test strip Use to check blood sugars daily-Dx code E11.65 100 each 1   Multiple Vitamin (MULTIVITAMIN WITH MINERALS) TABS tablet Take 1 tablet by mouth daily.      OneTouch Delica Lancets 30G MISC Use to check blood sugars daily-Dx code E11.65 100 each 1   SitaGLIPtin-MetFORMIN HCl (JANUMET XR) 50-500 MG TB24 1 tablet daily 30 tablet 3   albuterol (VENTOLIN HFA) 108 (90 Base) MCG/ACT inhaler Inhale 2 puffs into the lungs every 6 (six) hours as needed for wheezing or shortness of breath. (Patient not taking: Reported on 08/19/2023) 18 g 2   No current facility-administered medications for this visit.    PHYSICAL EXAM: Vitals:   08/19/23 1548  BP: 122/78  Pulse: 74  SpO2: 96%  Weight: 146 lb 9.6 oz (66.5 kg)  Height: 5\' 8"  (1.727 m)   Body mass index is 22.29 kg/m.  Wt Readings from Last 3 Encounters:  08/19/23 146 lb 9.6 oz (66.5 kg)  05/31/23 146 lb (66.2 kg)  05/31/23 146 lb (66.2 kg)    General: Well developed, well nourished male in no apparent distress.  HEENT: AT/[place], no external lesions.  Eyes: Conjunctiva clear and no icterus. Neck: Neck supple  Lungs: Respirations not labored Neurologic: Alert, oriented, normal speech Extremities / Skin: Dry. No sores or rashes noted. No acanthosis nigricans Psychiatric: Does  not appear depressed or anxious  Diabetic Foot Exam - Simple   No data filed     LABS Reviewed Lab Results  Component Value Date   HGBA1C 6.4 08/18/2023   HGBA1C 6.6 (H) 05/28/2023   HGBA1C 6.9 (H) 02/17/2023   Lab Results  Component Value Date   FRUCTOSAMINE 324 (H) 10/14/2020   FRUCTOSAMINE 380 (H) 08/19/2020   FRUCTOSAMINE 279 09/12/2015   Lab Results  Component Value Date  CHOL 105 05/28/2023   HDL 38.90 (L) 05/28/2023   LDLCALC 46 05/28/2023   TRIG 101.0 05/28/2023   CHOLHDL 3 05/28/2023   Lab Results  Component Value Date   MICRALBCREAT 0.5 05/28/2023   MICRALBCREAT 0.4 07/13/2022   Lab Results  Component Value Date   CREATININE 1.13 08/18/2023   Lab Results  Component Value Date   GFR 76.46 08/18/2023    ASSESSMENT / PLAN  1. Type 2 diabetes mellitus with other specified complication, without long-term current use of insulin (HCC)     Diabetes Mellitus type 2, complicated by diabetic retinopathy. - Diabetic status / severity: Controlled  Lab Results  Component Value Date   HGBA1C 6.4 08/18/2023    - Hemoglobin A1c goal : <7%  - Medications: No change except for timing of the glimepiride.  I) continue acarbose 50 mg with breakfast and 100 mg with lunch. II) continue Janumet extended release 50/500 mg daily. III) continue glimepiride/Amaryl 1 mg daily take with meal  - Home glucose testing: Few times a week in the morning fasting and at bedtime. - Discussed/ Gave Hypoglycemia treatment plan.  # Consult : not required at this time.   # Annual urine for microalbuminuria/ creatinine ratio, no microalbuminuria currently.  Lab Results  Component Value Date   MICRALBCREAT 0.5 05/28/2023    # Foot check nightly.  # Annual dilated diabetic eye exams.   - Diet: Make healthy diabetic food choices - Life style / activity / exercise: Discussed.  2. Blood pressure  -  BP Readings from Last 1 Encounters:  08/19/23 122/78    - Control is  in target.  - No change in current plans.  3. Lipid status / Hyperlipidemia - Last  Lab Results  Component Value Date   LDLCALC 46 05/28/2023   - Continue continue atorvastatin 10 mg daily.  Joe Boyd was seen today for follow-up.  Diagnoses and all orders for this visit:  Type 2 diabetes mellitus with other specified complication, without long-term current use of insulin (HCC)    DISPOSITION Follow up in clinic in 3 months suggested.   All questions answered and patient verbalized understanding of the plan.  [place] Raegen Tarpley, MD [facility] Endocrinology [facility] [address] [employer] [place], [address] [place], [place] [address] Phone # [phone]  At least part of this note was generated using voice recognition software. Inadvertent word errors may have occurred, which were not recognized during the proofreading process.

## 2023-08-19 NOTE — Patient Instructions (Signed)
No change on diabetes meds.  Take Glimepiride with meal.  Bring glucometer in follow up visit.

## 2023-09-30 ENCOUNTER — Ambulatory Visit (INDEPENDENT_AMBULATORY_CARE_PROVIDER_SITE_OTHER): Payer: 59

## 2023-09-30 DIAGNOSIS — Z23 Encounter for immunization: Secondary | ICD-10-CM

## 2023-10-08 ENCOUNTER — Other Ambulatory Visit: Payer: Self-pay

## 2023-10-08 DIAGNOSIS — E1165 Type 2 diabetes mellitus with hyperglycemia: Secondary | ICD-10-CM

## 2023-10-08 MED ORDER — GLIMEPIRIDE 1 MG PO TABS
ORAL_TABLET | ORAL | 2 refills | Status: DC
Start: 2023-10-08 — End: 2023-12-02

## 2023-11-24 ENCOUNTER — Ambulatory Visit: Payer: 59 | Admitting: Endocrinology

## 2023-12-02 ENCOUNTER — Other Ambulatory Visit: Payer: Self-pay | Admitting: Endocrinology

## 2023-12-02 DIAGNOSIS — E1165 Type 2 diabetes mellitus with hyperglycemia: Secondary | ICD-10-CM

## 2023-12-09 ENCOUNTER — Other Ambulatory Visit: Payer: Self-pay

## 2023-12-09 DIAGNOSIS — E7800 Pure hypercholesterolemia, unspecified: Secondary | ICD-10-CM

## 2023-12-09 MED ORDER — ATORVASTATIN CALCIUM 10 MG PO TABS
10.0000 mg | ORAL_TABLET | Freq: Every day | ORAL | 3 refills | Status: DC
Start: 2023-12-09 — End: 2024-01-19

## 2024-01-05 DIAGNOSIS — H40053 Ocular hypertension, bilateral: Secondary | ICD-10-CM | POA: Diagnosis not present

## 2024-01-05 DIAGNOSIS — E113293 Type 2 diabetes mellitus with mild nonproliferative diabetic retinopathy without macular edema, bilateral: Secondary | ICD-10-CM | POA: Diagnosis not present

## 2024-01-05 LAB — HM DIABETES EYE EXAM

## 2024-01-13 ENCOUNTER — Other Ambulatory Visit: Payer: Self-pay | Admitting: Endocrinology

## 2024-01-19 ENCOUNTER — Ambulatory Visit: Payer: 59 | Admitting: Endocrinology

## 2024-01-19 ENCOUNTER — Encounter: Payer: Self-pay | Admitting: Endocrinology

## 2024-01-19 VITALS — BP 136/84 | HR 81 | Wt 154.6 lb

## 2024-01-19 DIAGNOSIS — E1169 Type 2 diabetes mellitus with other specified complication: Secondary | ICD-10-CM

## 2024-01-19 DIAGNOSIS — E118 Type 2 diabetes mellitus with unspecified complications: Secondary | ICD-10-CM

## 2024-01-19 DIAGNOSIS — Z7984 Long term (current) use of oral hypoglycemic drugs: Secondary | ICD-10-CM | POA: Diagnosis not present

## 2024-01-19 DIAGNOSIS — E7800 Pure hypercholesterolemia, unspecified: Secondary | ICD-10-CM

## 2024-01-19 LAB — POCT GLYCOSYLATED HEMOGLOBIN (HGB A1C): Hemoglobin A1C: 6.7 % — AB (ref 4.0–5.6)

## 2024-01-19 MED ORDER — GLIMEPIRIDE 1 MG PO TABS: ORAL_TABLET | ORAL | 3 refills | Status: DC

## 2024-01-19 MED ORDER — JANUMET XR 50-500 MG PO TB24: 1.0000 | ORAL_TABLET | Freq: Every day | ORAL | 3 refills | Status: DC

## 2024-01-19 MED ORDER — ACARBOSE 50 MG PO TABS: ORAL_TABLET | ORAL | 3 refills | Status: DC

## 2024-01-19 MED ORDER — ATORVASTATIN CALCIUM 10 MG PO TABS: 10.0000 mg | ORAL_TABLET | Freq: Every day | ORAL | 3 refills | Status: DC

## 2024-01-19 MED ORDER — ONETOUCH DELICA LANCETS 30G MISC: 1 refills | Status: AC

## 2024-01-19 MED ORDER — ONETOUCH VERIO VI STRP: ORAL_STRIP | 1 refills | Status: DC

## 2024-01-19 NOTE — Progress Notes (Signed)
Outpatient Endocrinology Note [place] Timo Hartwig, MD  01/19/24  Patient's Name: Joe Boyd    DOB: May 09, 1976    MRN: [id number]                                                    REASON OF VISIT: Follow up for type 2 diabetes mellitus  PCP: Jarold Motto, PA  HISTORY OF PRESENT ILLNESS:   Joe Boyd is a 48 y.o. old male with past medical history listed below, is here for follow up of type 2 diabetes mellitus.   Pertinent Diabetes History: He was diagnosed with type 2 diabetes mellitus in 2012.  Hemoglobin A1c at the time of diagnosis was 12.8%.  He was initially managed with metformin and lifestyle modification.  He used to be in [place] area and moved to this area in 2014 /2015 timeframe.  Chronic Diabetes Complications : Retinopathy: yes/unilateral mild background retinopathy. Last ophthalmology exam was done on annually, reportedly.  No records available. Nephropathy: no Peripheral neuropathy: no Coronary artery disease: no Stroke: no  Relevant comorbidities and cardiovascular risk factors: Obesity: no Body mass index is 23.51 kg/m.  Hypertension: yes Hyperlipidemia. Yes, on statin.   Current / Home Diabetic regimen includes: Acarbose 50 mg with breakfast and 100 mg at lunch. Janumet extended release 50/500 mg 1 tab daily. Amaryl/glimepiride 1 mg in the evening.  Prior diabetic medications: Kombiglyze Glyset  Glycemic data:   He has one touch glucometer, glucose reviewed from his phone app as follows.  Checking at different times of the day mostly in the morning fasting.  Had checked couple of times a week.  No hypoglycemia. 107, 129, 111, 123, 100, 122, 125.   Factors modifying glucose control: 1.  Diabetic diet assessment: usually two meals a day, larger meals breakfast or lunch, Light dinner if he eats.   2.  Staying active or exercising:   3.  Medication compliance: compliant all of the time.  # Thyroid: history of small goiter with normal TSH.    Interval history Glucometer data as reported above.  Hemoglobin A1c today 6.7%.  Patient reports his physical activity has been less lately, mother passed away few months ago.  Lately having pain on the left toes, denies numbness and tingling.  No vision problem.  He reports he had diabetic eye exam recently , no note available, patient is asked to call his eye clinic to send diabetic eye report to our clinic.  REVIEW OF SYSTEMS As per history of present illness.   PAST MEDICAL HISTORY: Past Medical History:  Diagnosis Date   Diabetes mellitus without complication (HCC)    Tuberculosis    Finished Rx in 8/15    PAST SURGICAL HISTORY: History reviewed. No pertinent surgical history.  ALLERGIES: Allergies  Allergen Reactions   Augmentin [Amoxicillin-Pot Clavulanate] Itching   Diclofenac     Abnormal liver functions    FAMILY HISTORY:  Family History  Problem Relation Age of Onset   Diabetes Mother    Hypertension Mother    Thyroid disease Mother    Heart disease Mother    Diabetes Father    Hypertension Father    Heart disease Father    Breast cancer Sister        42s   Heart disease Paternal Uncle    Cancer Neg Hx  Colon cancer Neg Hx     SOCIAL HISTORY: Social History   Socioeconomic History   Marital status: Divorced    Spouse name: Not on file   Number of children: 1   Years of education: 16   Highest education level: Not on file  Occupational History   Occupation: Ship broker: [employer]  Tobacco Use   Smoking status: Never   Smokeless tobacco: Never  Vaping Use   Vaping status: Never Used  Substance and Sexual Activity   Alcohol use: Yes    Comment: occasionally   Drug use: No   Sexual activity: Not on file  Other Topics Concern   Not on file  Social History Narrative   Born and raised in [place] until the age of 78. Parents live in [place]. Father is a Development worker, international aid. Divorced. Son is 65. 04/19/2017    Social Drivers of Manufacturing engineer Strain: Not on file  Food Insecurity: Not on file  Transportation Needs: Not on file  Physical Activity: Not on file  Stress: Not on file  Social Connections: Not on file    MEDICATIONS:  Current Outpatient Medications  Medication Sig Dispense Refill   acarbose (PRECOSE) 50 MG tablet Take 2 tablet by mouth daily at lunch.  May also take 1 tab at breakfast and dinner if eating 270 tablet 3   albuterol (VENTOLIN HFA) 108 (90 Base) MCG/ACT inhaler Inhale 2 puffs into the lungs every 6 (six) hours as needed for wheezing or shortness of breath. (Patient not taking: Reported on 08/19/2023) 18 g 2   atorvastatin (LIPITOR) 10 MG tablet Take 1 tablet (10 mg total) by mouth daily. 90 tablet 3   Blood Glucose Monitoring Suppl (ONETOUCH VERIO [facility] SYSTEM) w/Device KIT Use to check blood sugars daily-Dx code E11.65 1 kit 1   clotrimazole (LOTRIMIN) 1 % cream Apply 1 application topically 2 (two) times daily. 30 g 0   Continuous Blood Gluc Sensor (FREESTYLE LIBRE 3 SENSOR) MISC 1 Device by Does not apply route every 14 (fourteen) days. Apply 1 sensor on upper arm every 14 days for continuous glucose monitoring 2 each 2   glimepiride (AMARYL) 1 MG tablet TAKE 1 TABLET BY MOUTH EVERYDAY AT BEDTIME 90 tablet 3   glucose blood (ONETOUCH VERIO) test strip Use to check blood sugars daily-Dx code E11.65 100 each 1   Multiple Vitamin (MULTIVITAMIN WITH MINERALS) TABS tablet Take 1 tablet by mouth daily.      OneTouch Delica Lancets 30G MISC Use to check blood sugars daily-Dx code E11.65 100 each 1   SitaGLIPtin-MetFORMIN HCl (JANUMET XR) 50-500 MG TB24 Take 1 tablet by mouth daily. 90 tablet 3   No current facility-administered medications for this visit.    PHYSICAL EXAM: Vitals:   01/19/24 1512  BP: 136/84  Pulse: 81  SpO2: 95%  Weight: 154 lb 9.6 oz (70.1 kg)   Body mass index is 23.51 kg/m.  Wt Readings from Last 3 Encounters:  01/19/24 154 lb 9.6 oz (70.1 kg)  08/19/23 146  lb 9.6 oz (66.5 kg)  05/31/23 146 lb (66.2 kg)    General: Well developed, well nourished male in no apparent distress.  HEENT: AT/[place], no external lesions.  Eyes: Conjunctiva clear and no icterus. Neck: Neck supple  Lungs: Respirations not labored Neurologic: Alert, oriented, normal speech Extremities / Skin: Dry. No sores or rashes noted.  Psychiatric: Does not appear depressed or anxious  Diabetic Foot Exam -  Simple   Simple Foot Form Diabetic Foot exam was performed with the following findings: Yes 01/19/2024  3:35 PM  Visual Inspection No deformities, no ulcerations, no other skin breakdown bilaterally: Yes Sensation Testing Intact to touch and monofilament testing bilaterally: Yes Pulse Check Posterior Tibialis and Dorsalis pulse intact bilaterally: Yes Comments     LABS Reviewed Lab Results  Component Value Date   HGBA1C 6.7 (A) 01/19/2024   HGBA1C 6.4 08/18/2023   HGBA1C 6.6 (H) 05/28/2023   Lab Results  Component Value Date   FRUCTOSAMINE 324 (H) 10/14/2020   FRUCTOSAMINE 380 (H) 08/19/2020   FRUCTOSAMINE 279 09/12/2015   Lab Results  Component Value Date   CHOL 105 05/28/2023   HDL 38.90 (L) 05/28/2023   LDLCALC 46 05/28/2023   TRIG 101.0 05/28/2023   CHOLHDL 3 05/28/2023   Lab Results  Component Value Date   MICRALBCREAT 0.5 05/28/2023   MICRALBCREAT 0.4 07/13/2022   Lab Results  Component Value Date   CREATININE 1.13 08/18/2023   Lab Results  Component Value Date   GFR 76.46 08/18/2023    ASSESSMENT / PLAN  1. Type 2 diabetes mellitus with other specified complication, without long-term current use of insulin (HCC)   2. Controlled type 2 diabetes mellitus with complication, without long-term current use of insulin (HCC)   3. Hypercholesterolemia      Diabetes Mellitus type 2, complicated by diabetic retinopathy. - Diabetic status / severity: Controlled  Lab Results  Component Value Date   HGBA1C 6.7 (A) 01/19/2024    - Hemoglobin  A1c goal : <7%  - Medications: No change  I) continue acarbose 50 mg with breakfast and 100 mg with lunch. II) continue Janumet extended release 50/500 mg daily. III) continue glimepiride/Amaryl 1 mg daily take with meal /supper.  - Home glucose testing: Few times a week in the morning fasting and at bedtime. - Discussed/ Gave Hypoglycemia treatment plan.  # Consult : not required at this time.   # Annual urine for microalbuminuria/ creatinine ratio, no microalbuminuria currently.  Lab Results  Component Value Date   MICRALBCREAT 0.5 05/28/2023    # Foot check nightly.  # Annual dilated diabetic eye exams.  Patient has ?  Mild diabetic retinopathy.  He reports he had recent diabetic eye exam, asked to fax report to our clinic.  - Diet: Make healthy diabetic food choices - Life style / activity / exercise: Discussed.  Advised to increase physical activity or walking.  2. Blood pressure  -  BP Readings from Last 1 Encounters:  01/19/24 136/84    - Control is in target.  - No change in current plans.  3. Lipid status / Hyperlipidemia - Last  Lab Results  Component Value Date   LDLCALC 46 05/28/2023   - Continue continue atorvastatin 10 mg daily.  Joe Boyd was seen today for follow-up.  Diagnoses and all orders for this visit:  Type 2 diabetes mellitus with other specified complication, without long-term current use of insulin (HCC) -     POCT glycosylated hemoglobin (Hb A1C) -     Lipid panel -     Microalbumin / creatinine urine ratio -     Hemoglobin A1c -     BASIC METABOLIC PANEL WITH GFR  Controlled type 2 diabetes mellitus with complication, without long-term current use of insulin (HCC) -     acarbose (PRECOSE) 50 MG tablet; Take 2 tablet by mouth daily at lunch.  May also take 1 tab at  breakfast and dinner if eating -     glimepiride (AMARYL) 1 MG tablet; TAKE 1 TABLET BY MOUTH EVERYDAY AT BEDTIME  Hypercholesterolemia -     atorvastatin (LIPITOR) 10 MG  tablet; Take 1 tablet (10 mg total) by mouth daily.  Other orders -     SitaGLIPtin-MetFORMIN HCl (JANUMET XR) 50-500 MG TB24; Take 1 tablet by mouth daily. -     glucose blood (ONETOUCH VERIO) test strip; Use to check blood sugars daily-Dx code E11.65 -     OneTouch Delica Lancets 30G MISC; Use to check blood sugars daily-Dx code E11.65     DISPOSITION Follow up in clinic in 4 months suggested.  Labs as ordered prior to follow-up visit.   All questions answered and patient verbalized understanding of the plan.  [place] Roxsana Riding, MD [facility] Endocrinology [facility] [address] [place], [address] [place], [place] [address] Phone # [phone]  At least part of this note was generated using voice recognition software. Inadvertent word errors may have occurred, which were not recognized during the proofreading process.

## 2024-01-24 ENCOUNTER — Encounter: Payer: Self-pay | Admitting: Endocrinology

## 2024-01-24 ENCOUNTER — Telehealth: Payer: Self-pay

## 2024-01-24 NOTE — Telephone Encounter (Signed)
PA needed for One touch test strips

## 2024-01-26 ENCOUNTER — Other Ambulatory Visit: Payer: Self-pay | Admitting: Endocrinology

## 2024-01-26 DIAGNOSIS — E1169 Type 2 diabetes mellitus with other specified complication: Secondary | ICD-10-CM

## 2024-01-26 MED ORDER — DEXCOM G7 SENSOR MISC: 1.0000 | 3 refills | Status: DC

## 2024-02-01 ENCOUNTER — Telehealth: Payer: Self-pay | Admitting: Pharmacy Technician

## 2024-02-01 ENCOUNTER — Other Ambulatory Visit (HOSPITAL_COMMUNITY): Payer: Self-pay

## 2024-02-01 DIAGNOSIS — E1169 Type 2 diabetes mellitus with other specified complication: Secondary | ICD-10-CM

## 2024-02-01 NOTE — Telephone Encounter (Signed)
PA request has been  received . New Encounter created for follow up. For additional info see Pharmacy Prior Auth telephone encounter from 02/01/24.

## 2024-02-01 NOTE — Telephone Encounter (Signed)
 Pharmacy Patient Advocate Encounter   Received notification from Pt Calls Messages that prior authorization for One Touch Verio Test strips is required/requested.   Insurance verification completed.   The patient is insured through [employer] .   Per test claim:  Accu-Check is preferred by the insurance.  If suggested medication is appropriate, Please send in a new RX and discontinue this one. If not, please advise as to why it's not appropriate so that we may request a Prior Authorization. Please note, some preferred medications may still require a PA.  If the suggested medications have not been trialed and there are no contraindications to their use, the PA will not be submitted, as it will not be approved.   Also, it appears the ins will only cover up to a 30 day supply. They come in boxes of 50 or 100. Not sure if the pt should test more than once a day. If so, could you put that on the prescription, so the pharmacy will fill it accurately. Technically 50 would be a 50 day supply the way it is written. They can't open the boxes, but some pharmacies are funny about how the day supply gets entered.

## 2024-02-04 ENCOUNTER — Other Ambulatory Visit: Payer: Self-pay | Admitting: Endocrinology

## 2024-02-04 DIAGNOSIS — E1169 Type 2 diabetes mellitus with other specified complication: Secondary | ICD-10-CM

## 2024-02-04 MED ORDER — LANCET DEVICE MISC: 1.0000 | Freq: Three times a day (TID) | 0 refills | Status: AC

## 2024-02-04 MED ORDER — BLOOD GLUCOSE TEST VI STRP: 1.0000 | ORAL_STRIP | Freq: Two times a day (BID) | 11 refills | Status: AC

## 2024-02-04 MED ORDER — LANCETS MISC. MISC: 1.0000 | Freq: Two times a day (BID) | 11 refills | Status: AC

## 2024-02-04 MED ORDER — BLOOD GLUCOSE MONITORING SUPPL DEVI: 1.0000 | Freq: Three times a day (TID) | 0 refills | Status: DC

## 2024-02-04 NOTE — Telephone Encounter (Signed)
 Pt been notified regarding meter been sent to the pharmacy

## 2024-02-04 NOTE — Addendum Note (Signed)
 Addended by: Teneisha Gignac, [place] on: 02/04/2024 10:00 AM   Modules accepted: Orders

## 2024-02-04 NOTE — Telephone Encounter (Signed)
 Sent prescription for glucometer and test supplies to his pharmacy.

## 2024-02-07 ENCOUNTER — Other Ambulatory Visit: Payer: Self-pay | Admitting: Endocrinology

## 2024-02-07 DIAGNOSIS — E1169 Type 2 diabetes mellitus with other specified complication: Secondary | ICD-10-CM

## 2024-02-07 MED ORDER — LANCET DEVICE MISC: 1.0000 | Freq: Three times a day (TID) | 0 refills | Status: AC

## 2024-02-07 MED ORDER — BLOOD GLUCOSE TEST VI STRP: 1.0000 | ORAL_STRIP | Freq: Every day | 3 refills | Status: AC

## 2024-02-07 MED ORDER — BLOOD GLUCOSE MONITORING SUPPL DEVI: 1.0000 | Freq: Three times a day (TID) | 0 refills | Status: AC

## 2024-02-07 MED ORDER — LANCETS MISC. MISC: 1.0000 | Freq: Every day | 3 refills | Status: AC

## 2024-05-03 ENCOUNTER — Other Ambulatory Visit: Payer: Self-pay

## 2024-05-10 ENCOUNTER — Telehealth: Payer: Self-pay

## 2024-05-10 ENCOUNTER — Encounter: Payer: Self-pay | Admitting: Endocrinology

## 2024-05-10 NOTE — Telephone Encounter (Signed)
 Patient called requesting a  PA needed

## 2024-05-10 NOTE — Telephone Encounter (Signed)
 Patient sent message regarding PA VM left

## 2024-05-12 ENCOUNTER — Telehealth: Payer: Self-pay

## 2024-05-12 ENCOUNTER — Other Ambulatory Visit (HOSPITAL_COMMUNITY): Payer: Self-pay

## 2024-05-12 NOTE — Telephone Encounter (Signed)
 Pharmacy Patient Advocate Encounter   Received notification from Pt Calls Messages that prior authorization for Janumet  is required/requested.   Insurance verification completed.   The patient is insured through [facility] .   Per test claim: PA required; PA submitted to above mentioned insurance via CoverMyMeds Key/confirmation #/EOC [id number] Status is pending

## 2024-05-12 NOTE — Telephone Encounter (Signed)
 Patient calling to f/u with PA, stated he is down to his last. RN will forward message to PA team.

## 2024-05-16 NOTE — Telephone Encounter (Signed)
 Pharmacy Patient Advocate Encounter  Received notification from [facility] that Prior Authorization for Janumet  XR 50-500MG  tablets has been APPROVED from 05-12-2024 to 05-12-2025   PA #/Case ID/Reference #: [id number]

## 2024-05-23 ENCOUNTER — Encounter: Payer: Self-pay | Admitting: Physician Assistant

## 2024-05-24 ENCOUNTER — Other Ambulatory Visit: Payer: 59

## 2024-05-24 DIAGNOSIS — E1169 Type 2 diabetes mellitus with other specified complication: Secondary | ICD-10-CM | POA: Diagnosis not present

## 2024-05-25 ENCOUNTER — Ambulatory Visit: Payer: Self-pay | Admitting: Endocrinology

## 2024-05-25 ENCOUNTER — Encounter: Payer: Self-pay | Admitting: Physician Assistant

## 2024-05-25 ENCOUNTER — Ambulatory Visit (INDEPENDENT_AMBULATORY_CARE_PROVIDER_SITE_OTHER): Admitting: Physician Assistant

## 2024-05-25 VITALS — BP 128/76 | HR 67 | Temp 98.1°F | Ht 68.0 in | Wt 157.5 lb

## 2024-05-25 DIAGNOSIS — H9202 Otalgia, left ear: Secondary | ICD-10-CM

## 2024-05-25 LAB — LIPID PANEL
Cholesterol: 110 mg/dL (ref <200)
HDL: 39 mg/dL — ABNORMAL LOW (ref >=40)
LDL Cholesterol (Calc): 53 mg/dL
Non-HDL Cholesterol (Calc): 71 mg/dL (ref <130)
Total CHOL/HDL Ratio: 2.8 (calc) (ref <5.0)
Triglycerides: 95 mg/dL (ref <150)

## 2024-05-25 LAB — BASIC METABOLIC PANEL WITH GFR
BUN: 15 mg/dL (ref 7–25)
CO2: 29 mmol/L (ref 20–32)
Calcium: 9.6 mg/dL (ref 8.6–10.3)
Chloride: 105 mmol/L (ref 98–110)
Creat: 1.12 mg/dL (ref 0.70–1.30)
Glucose, Bld: 115 mg/dL — ABNORMAL HIGH (ref 65–99)
Potassium: 4.6 mmol/L (ref 3.5–5.3)
Sodium: 142 mmol/L (ref 135–146)
eGFR: 80 mL/min/{1.73_m2} (ref >=60)

## 2024-05-25 LAB — HEMOGLOBIN A1C
Hgb A1c MFr Bld: 7.1 % — ABNORMAL HIGH (ref <5.7)
Mean Plasma Glucose: 157 mg/dL
eAG (mmol/L): 8.7 mmol/L

## 2024-05-25 LAB — MICROALBUMIN / CREATININE URINE RATIO
Creatinine, Urine: 163 mg/dL (ref 20–320)
Microalb Creat Ratio: 2 mg/g{creat} (ref <30)
Microalb, Ur: 0.4 mg/dL

## 2024-05-25 MED ORDER — OFLOXACIN 0.3 % OT SOLN: 5.0000 [drp] | Freq: Every day | OTIC | 0 refills | Status: DC

## 2024-05-25 NOTE — Progress Notes (Signed)
 Joe Boyd is a 48 y.o. male here for a new problem.  History of Present Illness:   Chief Complaint  Patient presents with   Hearing Loss    Pt c/o hearing loss left ear x 1 week after swimming.   Left Ear Fullness: Pt complains of hearing loss and ear fullness in left ear.  Symptoms started 1 week ago after swimming.  Has been using Debrox era drops with no improvement.  States he "feels something" when pressing against his right ear but no other right ear symptoms.    Past Medical History:  Diagnosis Date   Diabetes mellitus without complication (HCC)    Tuberculosis    Finished Rx in 8/15     Social History   Tobacco Use   Smoking status: Never   Smokeless tobacco: Never  Vaping Use   Vaping status: Never Used  Substance Use Topics   Alcohol use: Yes    Comment: occasionally   Drug use: No    No past surgical history on file.  Family History  Problem Relation Age of Onset   Diabetes Mother    Hypertension Mother    Thyroid  disease Mother    Heart disease Mother    Diabetes Father    Hypertension Father    Heart disease Father    Breast cancer Sister        74s   Heart disease Paternal Uncle    Cancer Neg Hx    Colon cancer Neg Hx     Allergies  Allergen Reactions   Augmentin  [Amoxicillin -Pot Clavulanate] Itching   Diclofenac      Abnormal liver functions    Current Medications:   Current Outpatient Medications:    acarbose  (PRECOSE ) 50 MG tablet, Take 2 tablet by mouth daily at lunch.  May also take 1 tab at breakfast and dinner if eating, Disp: 270 tablet, Rfl: 3   atorvastatin  (LIPITOR) 10 MG tablet, Take 1 tablet (10 mg total) by mouth daily., Disp: 90 tablet, Rfl: 3   Blood Glucose Monitoring Suppl (GHT BLOOD GLUCOSE MONITOR) w/Device KIT, 1 EACH BY DOES NOT APPLY ROUTE IN THE MORNING, AT NOON, AND AT BEDTIME. MAY SUBSTITUTE TO ANY MANUFACTURER COVERED BY PATIENT'S INSURANCE., Disp: 1 kit, Rfl: 0   Blood Glucose Monitoring Suppl DEVI, 1  each by Does not apply route in the morning, at noon, and at bedtime. May substitute to any manufacturer covered by patient's insurance., Disp: 1 each, Rfl: 0   Continuous Glucose Sensor (DEXCOM G7 SENSOR) MISC, 1 Device by Does not apply route continuous. Change every 10 days., Disp: 9 each, Rfl: 3   glimepiride  (AMARYL ) 1 MG tablet, TAKE 1 TABLET BY MOUTH EVERYDAY AT BEDTIME, Disp: 90 tablet, Rfl: 3   Glucose Blood (BLOOD GLUCOSE TEST STRIPS) STRP, 1 each by In Vitro route in the morning and at bedtime. May substitute to any manufacturer covered by patient's insurance., Disp: 60 each, Rfl: 11   Glucose Blood (BLOOD GLUCOSE TEST STRIPS) STRP, 1 each by In Vitro route daily. May substitute to any manufacturer covered by patient's insurance., Disp: 100 each, Rfl: 3   Lancets Misc. MISC, 1 each by Does not apply route daily. May substitute to any manufacturer covered by patient's insurance., Disp: 100 each, Rfl: 3   Multiple Vitamin (MULTIVITAMIN WITH MINERALS) TABS tablet, Take 1 tablet by mouth daily. , Disp: , Rfl:    ofloxacin (FLOXIN) 0.3 % OTIC solution, Place 5 drops into the left ear daily., Disp: 5 mL,  Rfl: 0   OneTouch Delica Lancets 30G MISC, Use to check blood sugars daily-Dx code E11.65, Disp: 100 each, Rfl: 1   SitaGLIPtin -MetFORMIN  HCl (JANUMET  XR) 50-500 MG TB24, Take 1 tablet by mouth daily., Disp: 90 tablet, Rfl: 3   Review of Systems:   Negative unless otherwise specified per HPI.  Vitals:   Vitals:   05/25/24 0919  BP: 128/76  Pulse: 67  Temp: 98.1 F (36.7 C)  TempSrc: Temporal  SpO2: 96%  Weight: 157 lb 8 oz (71.4 kg)  Height: 5\' 8"  (1.727 m)     Body mass index is 23.95 kg/m.  Physical Exam:   Physical Exam Vitals and nursing note reviewed.  Constitutional:      Appearance: He is well-developed.  HENT:     Head: Normocephalic.     Right Ear: Tympanic membrane, ear canal and external ear normal. Tympanic membrane is not scarred, perforated or erythematous.      Left Ear: External ear normal. Decreased hearing noted. Tenderness present.  Eyes:     Conjunctiva/sclera: Conjunctivae normal.     Pupils: Pupils are equal, round, and reactive to light.  Pulmonary:     Effort: Pulmonary effort is normal.  Musculoskeletal:        General: Normal range of motion.     Cervical back: Normal range of motion.  Skin:    General: Skin is warm and dry.  Neurological:     Mental Status: He is alert and oriented to person, place, and time.  Psychiatric:        Behavior: Behavior normal.        Thought Content: Thought content normal.        Judgment: Judgment normal.    Ceruminosis is noted.  Wax is removed by syringing and manual debridement.  Moderate erythema to left canal after cleaning.  Assessment and Plan:   1. Ear pain, left (Primary)   No red flags on exam Wax removed with lavage Due to erythema and tenderness, will add topical antibiotic drop for a few days If any new/worsening symptom(s), recommend close follow up    I, Bernita Bristle, acting as a Neurosurgeon for Alexander Iba, [place]., have documented all relevant documentation on the behalf of Alexander Iba, [place], as directed by   while in the presence of Alexander Iba, [place].  I, Alexander Iba, [place], have reviewed all documentation for this visit. The documentation on 05/25/24 for the exam, diagnosis, procedures, and orders are all accurate and complete.  Alexander Iba, [name]

## 2024-05-26 ENCOUNTER — Encounter: Payer: Self-pay | Admitting: Endocrinology

## 2024-05-26 ENCOUNTER — Ambulatory Visit: Payer: 59 | Admitting: Endocrinology

## 2024-05-26 VITALS — BP 130/60 | HR 61 | Resp 16 | Ht 68.0 in | Wt 158.2 lb

## 2024-05-26 DIAGNOSIS — E1169 Type 2 diabetes mellitus with other specified complication: Secondary | ICD-10-CM | POA: Diagnosis not present

## 2024-05-26 DIAGNOSIS — Z7984 Long term (current) use of oral hypoglycemic drugs: Secondary | ICD-10-CM

## 2024-05-26 NOTE — Progress Notes (Addendum)
 Outpatient Endocrinology Note Joe Seaborn Nakama, MD  05/26/24  Patient's Name: Joe Boyd    DOB: 1976/04/23    MRN: [id number]                                                    REASON OF VISIT: Follow up for type 2 diabetes mellitus  PCP: Alexander Iba, PA  HISTORY OF PRESENT ILLNESS:   Joe Boyd is a 48 y.o. old male with past medical history listed below, is here for follow up of type 2 diabetes mellitus.   Pertinent Diabetes History: He was diagnosed with type 2 diabetes mellitus in 2012.  Hemoglobin A1c at the time of diagnosis was 12.8%.  He was initially managed with metformin  and lifestyle modification.  He used to be in [place]  area and moved to this area in 2014 /2015 timeframe.  Chronic Diabetes Complications : Retinopathy: yes/unilateral mild background retinopathy. Last ophthalmology exam was done on annually, reportedly.  No records available. Nephropathy: no Peripheral neuropathy: no Coronary artery disease: no Stroke: no  Relevant comorbidities and cardiovascular risk factors: Obesity: no Body mass index is 24.05 kg/m.  Hypertension: yes Hyperlipidemia. Yes, on statin.   Current / Home Diabetic regimen includes: Acarbose  50 mg 1 tablet 3 times a day. Janumet  extended release 50/500 mg 1 tab daily. Amaryl /glimepiride  1 mg in the evening.  Prior diabetic medications: Kombiglyze Glyset   Glycemic data:    CONTINUOUS GLUCOSE MONITORING SYSTEM (CGMS) INTERPRETATION:             Dexcom G7 CGM-  Sensor Download (Sensor download was reviewed and summarized below.) Dates: May 17 to May 26, 2024, 14 days.  Glucose Management Indicator: 7.7% Sensor Average: 182 SD 55    Impression: Mostly acceptable blood sugar.  Occasional hyperglycemia postprandially mainly with supper and sometime with lunch with blood sugar up to 300 range.  Blood sugar early morning fasting and early afternoon are acceptable.  No concerning hypoglycemia.  Noted to have low  blood sugar on May 28 overnight seems to be related to sensor issue.  Factors modifying glucose control: 1.  Diabetic diet assessment: 2-3 meals a day.   2.  Staying active or exercising:   3.  Medication compliance: compliant all of the time.  # Thyroid : history of small goiter with normal TSH.   Interval history Dexcom G7 CGM data as reviewed above.  Hemoglobin A1c 7.1%.  Recent lab results reviewed, acceptable cholesterol level, normal urine microalbumin creatinine ratio, electrolytes and renal function.  GMI on CGM 7.7%.  No other complaints today.  REVIEW OF SYSTEMS As per history of present illness.   PAST MEDICAL HISTORY: Past Medical History:  Diagnosis Date   Diabetes mellitus without complication (HCC)    Tuberculosis    Finished Rx in 8/15    PAST SURGICAL HISTORY: History reviewed. No pertinent surgical history.  ALLERGIES: Allergies  Allergen Reactions   Augmentin  [Amoxicillin -Pot Clavulanate] Itching   Diclofenac      Abnormal liver functions    FAMILY HISTORY:  Family History  Problem Relation Age of Onset   Diabetes Mother    Hypertension Mother    Thyroid  disease Mother    Heart disease Mother    Diabetes Father    Hypertension Father    Heart disease Father    Breast cancer Sister  40s   Heart disease Paternal Uncle    Cancer Neg Hx    Colon cancer Neg Hx     SOCIAL HISTORY: Social History   Socioeconomic History   Marital status: Divorced    Spouse name: Not on file   Number of children: 1   Years of education: 16   Highest education level: Not on file  Occupational History   Occupation: Ship broker: [employer]  Tobacco Use   Smoking status: Never   Smokeless tobacco: Never  Vaping Use   Vaping status: Never Used  Substance and Sexual Activity   Alcohol use: Yes    Comment: occasionally   Drug use: No   Sexual activity: Not on file  Other Topics Concern   Not on file  Social History Narrative    Born and raised in [place] until the age of 60. Parents live in [place]. Father is a Development worker, international aid. Divorced. Son is 62. 04/19/2017    Social Drivers of Corporate investment banker Strain: Not on file  Food Insecurity: Not on file  Transportation Needs: Not on file  Physical Activity: Not on file  Stress: Not on file  Social Connections: Not on file    MEDICATIONS:  Current Outpatient Medications  Medication Sig Dispense Refill   acarbose  (PRECOSE ) 50 MG tablet Take 2 tablet by mouth daily at lunch.  May also take 1 tab at breakfast and dinner if eating 270 tablet 3   atorvastatin  (LIPITOR) 10 MG tablet Take 1 tablet (10 mg total) by mouth daily. 90 tablet 3   Blood Glucose Monitoring Suppl (GHT BLOOD GLUCOSE MONITOR) w/Device KIT 1 EACH BY DOES NOT APPLY ROUTE IN THE MORNING, AT NOON, AND AT BEDTIME. MAY SUBSTITUTE TO ANY MANUFACTURER COVERED BY PATIENT'S INSURANCE. 1 kit 0   Blood Glucose Monitoring Suppl DEVI 1 each by Does not apply route in the morning, at noon, and at bedtime. May substitute to any manufacturer covered by patient's insurance. 1 each 0   Continuous Glucose Sensor (DEXCOM G7 SENSOR) MISC 1 Device by Does not apply route continuous. Change every 10 days. 9 each 3   glimepiride  (AMARYL ) 1 MG tablet TAKE 1 TABLET BY MOUTH EVERYDAY AT BEDTIME 90 tablet 3   Glucose Blood (BLOOD GLUCOSE TEST STRIPS) STRP 1 each by In Vitro route in the morning and at bedtime. May substitute to any manufacturer covered by patient's insurance. 60 each 11   Glucose Blood (BLOOD GLUCOSE TEST STRIPS) STRP 1 each by In Vitro route daily. May substitute to any manufacturer covered by patient's insurance. 100 each 3   Lancets Misc. MISC 1 each by Does not apply route daily. May substitute to any manufacturer covered by patient's insurance. 100 each 3   Multiple Vitamin (MULTIVITAMIN WITH MINERALS) TABS tablet Take 1 tablet by mouth daily.      ofloxacin  (FLOXIN ) 0.3 % OTIC solution Place 5 drops into the left  ear daily. 5 mL 0   OneTouch Delica Lancets 30G MISC Use to check blood sugars daily-Dx code E11.65 100 each 1   SitaGLIPtin -MetFORMIN  HCl (JANUMET  XR) 50-500 MG TB24 Take 1 tablet by mouth daily. 90 tablet 3   No current facility-administered medications for this visit.    PHYSICAL EXAM: Vitals:   05/26/24 1052  BP: 130/60  Pulse: 61  Resp: 16  SpO2: 98%  Weight: 158 lb 3.2 oz (71.8 kg)  Height: 5\' 8"  (1.727 m)    Body mass index  is 24.05 kg/m.  Wt Readings from Last 3 Encounters:  05/26/24 158 lb 3.2 oz (71.8 kg)  05/25/24 157 lb 8 oz (71.4 kg)  01/19/24 154 lb 9.6 oz (70.1 kg)    General: Well developed, well nourished male in no apparent distress.  HEENT: AT/[place], no external lesions.  Eyes: Conjunctiva clear and no icterus. Neck: Neck supple  Lungs: Respirations not labored Neurologic: Alert, oriented, normal speech Extremities / Skin: Dry.  Psychiatric: Does not appear depressed or anxious  Diabetic Foot Exam - Simple   No data filed    LABS Reviewed Lab Results  Component Value Date   HGBA1C 7.1 (H) 05/24/2024   HGBA1C 6.7 (A) 01/19/2024   HGBA1C 6.4 08/18/2023   Lab Results  Component Value Date   FRUCTOSAMINE 324 (H) 10/14/2020   FRUCTOSAMINE 380 (H) 08/19/2020   FRUCTOSAMINE 279 09/12/2015   Lab Results  Component Value Date   CHOL 110 05/24/2024   HDL 39 (L) 05/24/2024   LDLCALC 53 05/24/2024   TRIG 95 05/24/2024   CHOLHDL 2.8 05/24/2024   Lab Results  Component Value Date   MICRALBCREAT 2 05/24/2024   MICRALBCREAT 0.5 05/28/2023   Lab Results  Component Value Date   CREATININE 1.12 05/24/2024   Lab Results  Component Value Date   GFR 76.46 08/18/2023    ASSESSMENT / PLAN  1. Type 2 diabetes mellitus with other specified complication, without long-term current use of insulin (HCC)    Diabetes Mellitus type 2, complicated by diabetic retinopathy. - Diabetic status / severity: controlled  Lab Results  Component Value Date    HGBA1C 7.1 (H) 05/24/2024    - Hemoglobin A1c goal : <6.5%  CGM data mostly postprandial hyperglycemia especially with supper.  Discussed about limiting carbohydrate and limiting rice.  Discussed with wheezing dose of diabetic medication, he wants to work on diet at this time.  - Medications: No change  I) continue acarbose  50 mg 3 times a day with meals. II) continue Janumet  extended release 50/500 mg daily. III) continue glimepiride /Amaryl  1 mg daily take with meal /supper.  - Home glucose testing: Dexcom G7 and check as needed. - Discussed/ Gave Hypoglycemia treatment plan.  # Consult : Refer to dietitian/diabetes educator.  Patient is interested.  # Annual urine for microalbuminuria/ creatinine ratio, no microalbuminuria currently.  Lab Results  Component Value Date   MICRALBCREAT 2 05/24/2024    # Foot check nightly.  # Annual dilated diabetic eye exams.  Patient has ?  Mild diabetic retinopathy.  He reports he had recent diabetic eye exam, asked to fax report to our clinic.  - Diet: Make healthy diabetic food choices, discussed about limiting rice and may try pear boiled rice or the brown rice. - Life style / activity / exercise: Discussed.  Advised to increase physical activity or walking.  2. Blood pressure  -  BP Readings from Last 1 Encounters:  05/26/24 130/60    - Control is in target.  - No change in current plans.  3. Lipid status / Hyperlipidemia - Last  Lab Results  Component Value Date   LDLCALC 53 05/24/2024   - Continue continue atorvastatin  10 mg daily.  Diagnoses and all orders for this visit:  Type 2 diabetes mellitus with other specified complication, without long-term current use of insulin (HCC) -     Amb Referral to Nutrition and Diabetic Education   DISPOSITION Follow up in clinic in 4 months suggested.  Labs on the same day  of the visit.   All questions answered and patient verbalized understanding of the plan.  Joe Nicholaus Steinke,  MD [facility] Endocrin[facility] [address] [place], [address] [place], [place] [address] Phone # [phone]  At least part of this note was generated using voice recognition software. Inadvertent word errors may have occurred, which were not recognized during the proofreading process.

## 2024-05-29 ENCOUNTER — Encounter: Payer: Self-pay | Admitting: Endocrinology

## 2024-06-21 ENCOUNTER — Other Ambulatory Visit: Payer: Self-pay | Admitting: Medical Genetics

## 2024-06-22 ENCOUNTER — Other Ambulatory Visit

## 2024-06-22 DIAGNOSIS — Z006 Encounter for examination for normal comparison and control in clinical research program: Secondary | ICD-10-CM

## 2024-06-28 ENCOUNTER — Ambulatory Visit: Admitting: Physician Assistant

## 2024-06-28 ENCOUNTER — Encounter: Payer: Self-pay | Admitting: Physician Assistant

## 2024-06-28 VITALS — BP 140/82 | HR 67 | Temp 98.0°F | Ht 68.0 in | Wt 157.4 lb

## 2024-06-28 DIAGNOSIS — Z23 Encounter for immunization: Secondary | ICD-10-CM

## 2024-06-28 DIAGNOSIS — E1165 Type 2 diabetes mellitus with hyperglycemia: Secondary | ICD-10-CM | POA: Diagnosis not present

## 2024-06-28 DIAGNOSIS — Z0000 Encounter for general adult medical examination without abnormal findings: Secondary | ICD-10-CM | POA: Diagnosis not present

## 2024-06-28 DIAGNOSIS — F419 Anxiety disorder, unspecified: Secondary | ICD-10-CM | POA: Diagnosis not present

## 2024-06-28 DIAGNOSIS — F32A Depression, unspecified: Secondary | ICD-10-CM

## 2024-06-28 DIAGNOSIS — E785 Hyperlipidemia, unspecified: Secondary | ICD-10-CM

## 2024-06-28 NOTE — Patient Instructions (Addendum)
 It was great to see you!  Keep me posted on your mood. Happy to discuss at any time!  Please follow-up in 2-6 months for second shingles vaccine.  Take care,  Sharol Croghan

## 2024-06-28 NOTE — Progress Notes (Signed)
 Subjective:    Joe Boyd is a 48 y.o. male and is here for a comprehensive physical exam.  HPI  Health Maintenance Due  Topic Date Due   OPHTHALMOLOGY EXAM  11/05/2023   Zoster Vaccines- Shingrix (1 of 2) Never done    Acute Concerns: No acute concerns reported today.  Chronic Issues: DM Pt is compliant with Acarbose  100 mg daily, Glimepiride  1 mg daily, and Janumet  XR 50-500 mg daily. Last A1c was 7.1. No concerns.  HLD Pt is compliant with Atorvastatin  10 mg daily. No concerns.  Mental health Pt expresses concern with mental health, describing it as experiencing ups and downs. He states it has been affecting his sleep. Denies SI/HI. Pt states he will try to increase physical activity to see if it will improve his mood.  He would like to revisit treatment options if increased exercise does not help.  Health Maintenance: Immunizations -- We are updating pneumonia and shingles vaxx today Colonoscopy -- UTD, last done 11/28/2019 and findings show internal hemorrhoids. 10 year repeat recommended, next due 11/27/2029. PSA -- no family history; start checking at 39 Lab Results  Component Value Date   PSA 0.33 06/13/2021   Diet -- Overall healthy diet; is scheduled to see a dietician. Sleep habits -- Difficulty falling asleep and occasionally wakes up in the middle of the night. Exercise -- Gets around 6k steps per day.  Weight -- stable Recent weight history Wt Readings from Last 10 Encounters:  06/28/24 157 lb 6.1 oz (71.4 kg)  05/26/24 158 lb 3.2 oz (71.8 kg)  05/25/24 157 lb 8 oz (71.4 kg)  01/19/24 154 lb 9.6 oz (70.1 kg)  08/19/23 146 lb 9.6 oz (66.5 kg)  05/31/23 146 lb (66.2 kg)  05/31/23 146 lb (66.2 kg)  05/14/23 144 lb 6.1 oz (65.5 kg)  02/22/23 148 lb (67.1 kg)  11/27/22 158 lb 6.4 oz (71.8 kg)   Body mass index is 23.93 kg/m.  Mood -- Stable. Alcohol use --  reports current alcohol use of about 2.0 standard drinks of alcohol per week.   Tobacco use --  Tobacco Use: Low Risk  (06/28/2024)   Patient History    Smoking Tobacco Use: Never    Smokeless Tobacco Use: Never    Passive Exposure: Not on file    Eligible for Low Dose CT? no  UTD with eye doctor? UTD UTD with dentist? No     06/28/2024    1:35 PM  Depression screen PHQ 2/9  Decreased Interest 1  Down, Depressed, Hopeless 1  PHQ - 2 Score 2  Altered sleeping 1  Tired, decreased energy 1  Change in appetite 0  Feeling bad or failure about yourself  1  Trouble concentrating 0  Moving slowly or fidgety/restless 0  Suicidal thoughts 0  PHQ-9 Score 5  Difficult doing work/chores Not difficult at all    Other providers/specialists: Patient Care Team: Job Lukes, [place] as PCP - General (Physician Assistant) Elner Arley LABOR, MD as Consulting Physician (Ophthalmology)    PMHx, SurgHx, SocialHx, Medications, and Allergies were reviewed in the Visit Navigator and updated as appropriate.   Past Medical History:  Diagnosis Date   Depression 04/27/24   Miod goes up and down   Diabetes mellitus without complication (HCC)    Tuberculosis    Finished Rx in 8/15    No past surgical history on file.   Family History  Problem Relation Age of Onset   Diabetes Mother  Hypertension Mother    Thyroid  disease Mother    Heart disease Mother    Diabetes Father    Hypertension Father    Heart disease Father    Breast cancer Sister        48s   Heart disease Paternal Uncle    Cancer Neg Hx    Colon cancer Neg Hx     Social History   Tobacco Use   Smoking status: Never   Smokeless tobacco: Never  Vaping Use   Vaping status: Never Used  Substance Use Topics   Alcohol use: Yes    Alcohol/week: 2.0 standard drinks of alcohol    Types: 1 Glasses of wine, 1 Standard drinks or equivalent per week    Comment: Drink a wine or cocktail once or twice a month socially   Drug use: Never    Review of Systems:   Review of Systems  Constitutional:   Negative for chills, fever, malaise/fatigue and weight loss.  HENT:  Negative for hearing loss, sinus pain and sore throat.   Respiratory:  Negative for cough and hemoptysis.   Cardiovascular:  Negative for chest pain, palpitations, leg swelling and PND.  Gastrointestinal:  Negative for abdominal pain, constipation, diarrhea, heartburn, nausea and vomiting.  Genitourinary:  Negative for dysuria, frequency and urgency.  Musculoskeletal:  Negative for back pain, myalgias and neck pain.  Skin:  Negative for itching and rash.  Neurological:  Negative for dizziness, tingling, seizures and headaches.  Endo/Heme/Allergies:  Negative for polydipsia.  Psychiatric/Behavioral:  Negative for depression. The patient is not nervous/anxious.       Objective:    Vitals:   06/28/24 1300  BP: (!) 140/82  Pulse: 67  Temp: 98 F (36.7 C)  SpO2: 98%    Body mass index is 23.93 kg/m.  General  Alert, cooperative, no distress, appears stated age  Head:  Normocephalic, without obvious abnormality, atraumatic  Eyes:  PERRL, conjunctiva/corneas clear, EOM's intact, fundi benign, both eyes       Ears:  Normal TM's and external ear canals, both ears  Nose: Nares normal, septum midline, mucosa normal, no drainage or sinus tenderness  Throat: Lips, mucosa, and tongue normal; teeth and gums normal  Neck: Supple, symmetrical, trachea midline, no adenopathy;     thyroid :  No enlargement/tenderness/nodules; no carotid bruit or JVD  Back:   Symmetric, no curvature, ROM normal, no CVA tenderness  Lungs:   Clear to auscultation bilaterally, respirations unlabored  Chest wall:  No tenderness or deformity  Heart:  Regular rate and rhythm, S1 and S2 normal, no murmur, rub or gallop  Abdomen:   Soft, non-tender, bowel sounds active all four quadrants, no masses, no organomegaly  Extremities: Extremities normal, atraumatic, no cyanosis or edema  Prostate : Deferred   Skin: Skin color, texture, turgor normal, no  rashes or lesions  Lymph nodes: Cervical, supraclavicular, and axillary nodes normal  Neurologic: CNII-XII grossly intact. Normal strength, sensation and reflexes throughout   AssessmentPlan:   Routine physical examination Today patient counseled on age appropriate routine health concerns for screening and prevention, each reviewed and up to date or declined. Immunizations reviewed and up to date or declined. Labs reviewed. Risk factors for depression reviewed and negative. Hearing function and visual acuity are intact. ADLs screened and addressed as needed. Functional ability and level of safety reviewed and appropriate. Education, counseling and referrals performed based on assessed risks today. Patient provided with a copy of personalized plan for preventive services.  Need  for prophylactic vaccination against Streptococcus pneumoniae (pneumococcus) Updated today  Type 2 diabetes mellitus with hyperglycemia, without long-term current use of insulin (HCC) Reviewed most recent endo note Continue efforts at compliance and hopefully meeting with dietitian soon will help with further control  Hyperlipidemia, unspecified hyperlipidemia type Well controlled Continue Lipitor 10 mg daily  Anxiety and depression Denies suicidal ideation/hi Declines any acute intervention at this time Recommend close follow up if any issues arise I discussed with patient that if they develop any SI, to tell someone immediately and seek medical attention.  Need for prophylactic vaccination and inoculation against varicella Completed today, needs next dose in 2-6 month(s)   I, Lavern Simmers, acting as a Neurosurgeon for [employer], [place]., have documented all relevant documentation on the behalf of Lucie Buttner, [place], as directed by Lucie Buttner, PA while in the presence of Lucie Buttner, [place].  I, Lucie Buttner, [place], have reviewed all documentation for this visit. The documentation on 06/28/24 for the exam,  diagnosis, procedures, and orders are all accurate and complete.  Lucie Buttner, [name] [facility]

## 2024-07-03 ENCOUNTER — Encounter: Payer: Self-pay | Admitting: Physician Assistant

## 2024-07-05 DIAGNOSIS — H40053 Ocular hypertension, bilateral: Secondary | ICD-10-CM | POA: Diagnosis not present

## 2024-07-05 DIAGNOSIS — E113293 Type 2 diabetes mellitus with mild nonproliferative diabetic retinopathy without macular edema, bilateral: Secondary | ICD-10-CM | POA: Diagnosis not present

## 2024-07-11 LAB — GENECONNECT MOLECULAR SCREEN: Genetic Analysis Overall Interpretation: NEGATIVE

## 2024-07-19 ENCOUNTER — Encounter: Payer: Self-pay | Admitting: Physician Assistant

## 2024-07-19 DIAGNOSIS — Z008 Encounter for other general examination: Secondary | ICD-10-CM | POA: Diagnosis not present

## 2024-07-19 DIAGNOSIS — E11319 Type 2 diabetes mellitus with unspecified diabetic retinopathy without macular edema: Secondary | ICD-10-CM | POA: Diagnosis not present

## 2024-07-19 DIAGNOSIS — Z87438 Personal history of other diseases of male genital organs: Secondary | ICD-10-CM

## 2024-07-19 DIAGNOSIS — R030 Elevated blood-pressure reading, without diagnosis of hypertension: Secondary | ICD-10-CM | POA: Diagnosis not present

## 2024-07-19 DIAGNOSIS — E785 Hyperlipidemia, unspecified: Secondary | ICD-10-CM | POA: Diagnosis not present

## 2024-07-19 DIAGNOSIS — Z7984 Long term (current) use of oral hypoglycemic drugs: Secondary | ICD-10-CM | POA: Diagnosis not present

## 2024-07-20 NOTE — Telephone Encounter (Signed)
 Please see message. Okay to order PSA?

## 2024-07-28 ENCOUNTER — Encounter: Attending: Endocrinology | Admitting: Dietician

## 2024-07-28 ENCOUNTER — Encounter: Payer: Self-pay | Admitting: Dietician

## 2024-07-28 VITALS — Wt 157.0 lb

## 2024-07-28 DIAGNOSIS — E1165 Type 2 diabetes mellitus with hyperglycemia: Secondary | ICD-10-CM | POA: Diagnosis not present

## 2024-07-28 NOTE — Patient Instructions (Signed)
 Balanced proportion of your plate:  1/2 your plate vegetables (non-starchy)  A lean protein choice (chicken, [place], fish, egg, yogurt, nuts)  Carbohydrate (real food and less processed)  Continue to be active 30 minutes or more daily Remember to take your medication when eating out

## 2024-07-28 NOTE — Progress Notes (Signed)
 Diabetes Self-Management Education  Visit Type: First/Initial  Appt. Start Time: 1335 Appt. End Time: 1420  07/28/2024  Mr. Joe Boyd, identified by name and date of birth, is a 48 y.o. male with a diagnosis of Diabetes: Type 2.   ASSESSMENT Patient is here today alone.  He was last seen by myself on 07/11/2025. He would like to review better eating.    Referral:  Type 2 Diabetes (Dr. Mercie)  History includes:  Type 2 Diabetes (2011) Medications include:  Acarbose , glimepiride , Janumet , Atorvastatin  Labs noted:  A1C 7.1% 05/24/2024 increased from 6.7% 01/19/24 Lipid Panel     Component Value Date/Time   CHOL 110 05/24/2024 0838   TRIG 95 05/24/2024 0838   HDL 39 (L) 05/24/2024 0838   CHOLHDL 2.8 05/24/2024 0838   VLDL 20.2 05/28/2023 0826   LDLCALC 53 05/24/2024 0838   CGM:  Dexcom G7 CGM Results from download: 07/28/2024  % Time CGM active:   93 %   (Goal >70%)  Average glucose:   154 mg/dL for 14 days  Glucose management indicator:   7 %  Time in range (70-180 mg/dL):   77 %   (Goal >29%)  Time High (181-250 mg/dL):   22 %   (Goal < 74%)  Time Very High (>250 mg/dL):    1 %   (Goal < 5%)  Time Low (54-69 mg/dL):   0 %   (Goal <5%)  Time Very Low (<54 mg/dL):   <1 %   (Goal <8%)  %CV (glucose variability)    24  (Goal <36%)   157 lbs 07/28/2024 - gained weight with less exercise 147 lbs 06/2023 Patient lives alone. He does not know how to cook much.  Divorced.  Son just graduated from college. He sold his [employer].  He is now working part time as an Air cabin crew. He has a dog.  He walks her daily for about 30 minutes.  Weight 157 lb (71.2 kg). Body mass index is 23.87 kg/m.   Diabetes Self-Management Education - 07/28/24 1401       Visit Information   Visit Type First/Initial      Initial Visit   Diabetes Type Type 2    Date Diagnosed 2011    Are you currently following a meal plan? No    Are you taking your medications as  prescribed? No      Health Coping   How would you rate your overall health? Good      Psychosocial Assessment   Patient Belief/Attitude about Diabetes Motivated to manage diabetes    What is the hardest part about your diabetes right now, causing you the most concern, or is the most worrisome to you about your diabetes?   Making healty food and beverage choices    Self-care barriers None    Self-management support Doctor's office    Other persons present Patient    Patient Concerns Nutrition/Meal planning    Special Needs None    Preferred Learning Style No preference indicated    Learning Readiness Ready    How often do you need to have someone help you when you read instructions, pamphlets, or other written materials from your doctor or pharmacy? 1 - Never    What is the last grade level you completed in school? Bachelor's      Pre-Education Assessment   Patient understands the diabetes disease and treatment process. Comprehends key points    Patient understands incorporating nutritional management into lifestyle.  Needs Review    Patient undertands incorporating physical activity into lifestyle. Needs Review    Patient understands using medications safely. Needs Review    Patient understands monitoring blood glucose, interpreting and using results Needs Review    Patient understands prevention, detection, and treatment of acute complications. Needs Review    Patient understands prevention, detection, and treatment of chronic complications. Needs Review    Patient understands how to develop strategies to address psychosocial issues. Needs Review    Patient understands how to develop strategies to promote health/change behavior. Needs Review      Complications   Last HgB A1C per patient/outside source 7.1 %   05/24/2024 increased from 6.7% 01/19/2024   How often do you check your blood sugar? > 4 times/day    Fasting Blood glucose range (mg/dL) 29-870;869-820    Postprandial Blood  glucose range (mg/dL) 869-820;819-799;>799    Number of hypoglycemic episodes per month 1    Can you tell when your blood sugar is low? Yes    What do you do if your blood sugar is low? eats    Number of hyperglycemic episodes ( >200mg /dL): Daily    Can you tell when your blood sugar is high? Yes   usually when he forgets the acarbose  before meals   Have you had a dilated eye exam in the past 12 months? Yes    Have you had a dental exam in the past 12 months? No    Are you checking your feet? Yes    How many days per week are you checking your feet? 2      Dietary Intake   Breakfast tea and crackers OR omelet with cheese on the weekend    Lunch salad with corn, sliced chicken and southwest dressing    Dinner chicken quesadilla at home or burger at [employer] (evening) trail mix with nuts and dried fruit    Beverage(s) water, rare alcohol, rare diet coke, coffee with cream and splenda, tea with milk and splenda      Activity / Exercise   Activity / Exercise Type Light (walking / raking leaves)    How many days per week do you exercise? 7    How many minutes per day do you exercise? 30    Total minutes per week of exercise 210      Patient Education   Previous Diabetes Education Yes   2016   Healthy Eating Role of diet in the treatment of diabetes and the relationship between the three main macronutrients and blood glucose level;Food label reading, portion sizes and measuring food.;Plate Method;Information on hints to eating out and maintain blood glucose control.;Meal options for control of blood glucose level and chronic complications.;Meal timing in regards to the patients' current diabetes medication.;Reviewed blood glucose goals for pre and post meals and how to evaluate the patients' food intake on their blood glucose level.    Being Active Role of exercise on diabetes management, blood pressure control and cardiac health.    Medications Reviewed patients medication for  diabetes, action, purpose, timing of dose and side effects.    Monitoring Taught/evaluated CGM (comment)    Diabetes Stress and Support Identified and addressed patients feelings and concerns about diabetes;Worked with patient to identify barriers to care and solutions      Individualized Goals (developed by patient)   Nutrition General guidelines for healthy choices and portions discussed    Physical Activity Exercise 5-7 days per week;30 minutes  per day    Medications take my medication as prescribed    Monitoring  Consistenly use CGM    Problem Solving Eating Pattern    Reducing Risk examine blood glucose patterns;do foot checks daily;treat hypoglycemia with 15 grams of carbs if blood glucose less than 70mg /dL      Post-Education Assessment   Patient understands the diabetes disease and treatment process. Comprehends key points    Patient understands incorporating nutritional management into lifestyle. Comprehends key points    Patient undertands incorporating physical activity into lifestyle. Comprehends key points    Patient understands using medications safely. Comphrehends key points    Patient understands monitoring blood glucose, interpreting and using results Comprehends key points    Patient understands prevention, detection, and treatment of acute complications. Comprehends key points    Patient understands prevention, detection, and treatment of chronic complications. Comprehends key points    Patient understands how to develop strategies to address psychosocial issues. Comprehends key points    Patient understands how to develop strategies to promote health/change behavior. Comprehends key points      Outcomes   Expected Outcomes Demonstrated interest in learning. Expect positive outcomes    Future DMSE PRN    Program Status Completed          Individualized Plan for Diabetes Self-Management Training:   Learning Objective:  Patient will have a greater understanding of  diabetes self-management. Patient education plan is to attend individual and/or group sessions per assessed needs and concerns.   Plan:   Patient Instructions  Balanced proportion of your plate:  1/2 your plate vegetables (non-starchy)  A lean protein choice (chicken, [place], fish, egg, yogurt, nuts)  Carbohydrate (real food and less processed)  Continue to be active 30 minutes or more daily Remember to take your medication when eating out    Expected Outcomes:  Demonstrated interest in learning. Expect positive outcomes  Education material provided: Meal plan card, My Plate, and Diabetes Resources, 1800 calorie sample meal plans from AND  If problems or questions, patient to contact team via:  Phone  Future DSME appointment: PRN

## 2024-09-28 ENCOUNTER — Ambulatory Visit: Admitting: Endocrinology

## 2024-10-06 ENCOUNTER — Encounter: Payer: Self-pay | Admitting: Physician Assistant

## 2024-10-11 ENCOUNTER — Encounter: Payer: Self-pay | Admitting: Endocrinology

## 2024-10-11 ENCOUNTER — Ambulatory Visit: Payer: Self-pay | Admitting: Endocrinology

## 2024-10-11 ENCOUNTER — Ambulatory Visit: Admitting: Endocrinology

## 2024-10-11 VITALS — BP 116/70 | HR 62 | Resp 20 | Ht 68.0 in | Wt 154.8 lb

## 2024-10-11 DIAGNOSIS — E7800 Pure hypercholesterolemia, unspecified: Secondary | ICD-10-CM | POA: Diagnosis not present

## 2024-10-11 DIAGNOSIS — E118 Type 2 diabetes mellitus with unspecified complications: Secondary | ICD-10-CM

## 2024-10-11 DIAGNOSIS — E1169 Type 2 diabetes mellitus with other specified complication: Secondary | ICD-10-CM | POA: Diagnosis not present

## 2024-10-11 DIAGNOSIS — Z7984 Long term (current) use of oral hypoglycemic drugs: Secondary | ICD-10-CM | POA: Diagnosis not present

## 2024-10-11 LAB — POCT GLYCOSYLATED HEMOGLOBIN (HGB A1C): Hemoglobin A1C: 6.7 % — AB (ref 4.0–5.6)

## 2024-10-11 MED ORDER — GLIMEPIRIDE 1 MG PO TABS: ORAL_TABLET | ORAL | 3 refills | Status: AC

## 2024-10-11 MED ORDER — JANUMET XR 50-500 MG PO TB24: 1.0000 | ORAL_TABLET | Freq: Every day | ORAL | 3 refills | Status: DC

## 2024-10-11 MED ORDER — DEXCOM G7 SENSOR MISC: 1.0000 | 3 refills | Status: DC

## 2024-10-11 MED ORDER — ATORVASTATIN CALCIUM 10 MG PO TABS: 10.0000 mg | ORAL_TABLET | Freq: Every day | ORAL | 3 refills | Status: AC

## 2024-10-11 MED ORDER — ACARBOSE 50 MG PO TABS: ORAL_TABLET | ORAL | 3 refills | Status: AC

## 2024-10-11 NOTE — Progress Notes (Signed)
 Outpatient Endocrinology Note Joe Cadden Elizondo, MD  10/11/24  Patient's Name: Joe Boyd    DOB: November 01, 1976    MRN: [id number]                                                    REASON OF VISIT: Follow up for type 2 diabetes mellitus  PCP: Job Lukes, PA  HISTORY OF PRESENT ILLNESS:   Joe Boyd is a 48 y.o. old male with past medical history listed below, is here for follow up of type 2 diabetes mellitus.   Pertinent Diabetes History: He was diagnosed with type 2 diabetes mellitus in 2012.  Hemoglobin A1c at the time of diagnosis was 12.8%.  He was initially managed with metformin  and lifestyle modification.  He used to be in [place]  area and moved to this area in 2014 /2015 timeframe.  Chronic Diabetes Complications : Retinopathy: yes/unilateral mild background retinopathy 12/2023. Last ophthalmology exam was done on annually, reportedly.  No records available. Nephropathy: no Peripheral neuropathy: no Coronary artery disease: no Stroke: no  Relevant comorbidities and cardiovascular risk factors: Obesity: no Body mass index is 23.54 kg/m.  Hypertension: yes Hyperlipidemia. Yes, on statin.   Current / Home Diabetic regimen includes: Acarbose  50 mg 1 tablet 3 times a day. Janumet  extended release 50/500 mg 1 tab daily. Amaryl /glimepiride  1 mg in the evening.  Prior diabetic medications: Kombiglyze Glyset   Glycemic data:    CONTINUOUS GLUCOSE MONITORING SYSTEM (CGMS) INTERPRETATION:             Dexcom G7 CGM-  Sensor Download (Sensor download was reviewed and summarized below.) Dates: October 2 to October 15 , 2025, 14 days.  Glucose Management Indicator: 7.2%     Previous:    Impression: Mostly acceptable blood sugar with rare hyperglycemia with blood sugar 200-250 range related to bedtime snack and sometimes related to breakfast.  Blood sugar in between the meals and overnight acceptable.  No hypoglycemia.  Factors modifying glucose  control: 1.  Diabetic diet assessment: 2-3 meals a day.   2.  Staying active or exercising:   3.  Medication compliance: compliant all of the time.  # Thyroid : history of small goiter with normal TSH.   Interval history Hemoglobin A1c improved to 6.7%.  CGM data as reviewed above.  Mostly acceptable blood sugar except occasional hypoglycemia related to his snack.  He has no other complaints today.  Diabetes has been as reviewed and noted above.  REVIEW OF SYSTEMS As per history of present illness.   PAST MEDICAL HISTORY: Past Medical History:  Diagnosis Date   Depression 04/27/24   Miod goes up and down   Diabetes mellitus without complication (HCC)    Tuberculosis    Finished Rx in 8/15    PAST SURGICAL HISTORY: History reviewed. No pertinent surgical history.  ALLERGIES: Allergies  Allergen Reactions   Augmentin  [Amoxicillin -Pot Clavulanate] Itching   Diclofenac      Abnormal liver functions    FAMILY HISTORY:  Family History  Problem Relation Age of Onset   Diabetes Mother    Hypertension Mother    Thyroid  disease Mother    Heart disease Mother    Diabetes Father    Hypertension Father    Heart disease Father    Breast cancer Sister        42s  Heart disease Paternal Uncle    Cancer Neg Hx    Colon cancer Neg Hx     SOCIAL HISTORY: Social History   Socioeconomic History   Marital status: Divorced    Spouse name: Not on file   Number of children: 1   Years of education: 16   Highest education level: Bachelor's degree (e.g., BA, AB, BS)  Occupational History   Occupation: Ship broker: [employer]  Tobacco Use   Smoking status: Never   Smokeless tobacco: Never  Vaping Use   Vaping status: Never Used  Substance and Sexual Activity   Alcohol use: Yes    Alcohol/week: 2.0 standard drinks of alcohol    Types: 1 Glasses of wine, 1 Standard drinks or equivalent per week    Comment: Drink a wine or cocktail once or twice a month  socially   Drug use: Never   Sexual activity: Not Currently    Birth control/protection: Condom  Other Topics Concern   Not on file  Social History Narrative   Born and raised in [place] until the age of 54. Parents live in [place]. Father is a Development worker, international aid. Divorced. Son is 73. 04/19/2017    Social Drivers of Health   Financial Resource Strain: Low Risk  (06/24/2024)   Overall Financial Resource Strain (CARDIA)    Difficulty of Paying Living Expenses: Not hard at all  Food Insecurity: No Food Insecurity (06/24/2024)   Hunger Vital Sign    Worried About Running Out of Food in the Last Year: Never true    Ran Out of Food in the Last Year: Never true  Transportation Needs: No Transportation Needs (06/24/2024)   PRAPARE - Administrator, Civil Service (Medical): No    Lack of Transportation (Non-Medical): No  Physical Activity: Insufficiently Active (06/24/2024)   Exercise Vital Sign    Days of Exercise per Week: 7 days    Minutes of Exercise per Session: 20 min  Stress: Stress Concern Present (06/24/2024)   [employer] of Occupational Health - Occupational Stress Questionnaire    Feeling of Stress: To some extent  Social Connections: Moderately Isolated (06/24/2024)   Social Connection and Isolation Panel    Frequency of Communication with Friends and Family: More than three times a week    Frequency of Social Gatherings with Friends and Family: Twice a week    Attends Religious Services: 1 to 4 times per year    Active Member of [employer] or Organizations: No    Attends Engineer, structural: Not on file    Marital Status: Divorced    MEDICATIONS:  Current Outpatient Medications  Medication Sig Dispense Refill   Blood Glucose Monitoring Suppl (GHT BLOOD GLUCOSE MONITOR) w/Device KIT 1 EACH BY DOES NOT APPLY ROUTE IN THE MORNING, AT NOON, AND AT BEDTIME. MAY SUBSTITUTE TO ANY MANUFACTURER COVERED BY PATIENT'S INSURANCE. 1 kit 0   Blood Glucose Monitoring Suppl DEVI 1  each by Does not apply route in the morning, at noon, and at bedtime. May substitute to any manufacturer covered by patient's insurance. 1 each 0   Glucose Blood (BLOOD GLUCOSE TEST STRIPS) STRP 1 each by In Vitro route in the morning and at bedtime. May substitute to any manufacturer covered by patient's insurance. 60 each 11   Glucose Blood (BLOOD GLUCOSE TEST STRIPS) STRP 1 each by In Vitro route daily. May substitute to any manufacturer covered by patient's insurance. 100 each 3   Lancets  Misc. MISC 1 each by Does not apply route daily. May substitute to any manufacturer covered by patient's insurance. 100 each 3   OneTouch Delica Lancets 30G MISC Use to check blood sugars daily-Dx code E11.65 100 each 1   acarbose  (PRECOSE ) 50 MG tablet Take 2 tablet by mouth daily at lunch.  May also take 1 tab at breakfast and dinner if eating 270 tablet 3   atorvastatin  (LIPITOR) 10 MG tablet Take 1 tablet (10 mg total) by mouth daily. 90 tablet 3   Continuous Glucose Sensor (DEXCOM G7 SENSOR) MISC 1 Device by Does not apply route continuous. Change every 10 days. 9 each 3   glimepiride  (AMARYL ) 1 MG tablet TAKE 1 TABLET BY MOUTH EVERYDAY AT BEDTIME 90 tablet 3   SitaGLIPtin -MetFORMIN  HCl (JANUMET  XR) 50-500 MG TB24 Take 1 tablet by mouth daily. 90 tablet 3   No current facility-administered medications for this visit.    PHYSICAL EXAM: Vitals:   10/11/24 1115  BP: 116/70  Pulse: 62  Resp: 20  SpO2: 98%  Weight: 154 lb 12.8 oz (70.2 kg)  Height: 5' 8 (1.727 m)     Body mass index is 23.54 kg/m.  Wt Readings from Last 3 Encounters:  10/11/24 154 lb 12.8 oz (70.2 kg)  07/28/24 157 lb (71.2 kg)  06/28/24 157 lb 6.1 oz (71.4 kg)    General: Well developed, well nourished male in no apparent distress.  HEENT: AT/[place], no external lesions.  Eyes: Conjunctiva clear and no icterus. Neck: Neck supple  Lungs: Respirations not labored Neurologic: Alert, oriented, normal speech Extremities / Skin:  Dry.  Psychiatric: Does not appear depressed or anxious  Diabetic Foot Exam - Simple   No data filed    LABS Reviewed Lab Results  Component Value Date   HGBA1C 6.7 (A) 10/11/2024   HGBA1C 7.1 (H) 05/24/2024   HGBA1C 6.7 (A) 01/19/2024   Lab Results  Component Value Date   FRUCTOSAMINE 324 (H) 10/14/2020   FRUCTOSAMINE 380 (H) 08/19/2020   FRUCTOSAMINE 279 09/12/2015   Lab Results  Component Value Date   CHOL 110 05/24/2024   HDL 39 (L) 05/24/2024   LDLCALC 53 05/24/2024   TRIG 95 05/24/2024   CHOLHDL 2.8 05/24/2024   Lab Results  Component Value Date   MICRALBCREAT 2 05/24/2024   Lab Results  Component Value Date   CREATININE 1.12 05/24/2024   Lab Results  Component Value Date   GFR 76.46 08/18/2023    ASSESSMENT / PLAN  1. Type 2 diabetes mellitus with other specified complication, without long-term current use of insulin (HCC)   2. Controlled type 2 diabetes mellitus with complication, without long-term current use of insulin (HCC)   3. Hypercholesterolemia     Diabetes Mellitus type 2, complicated by diabetic retinopathy. - Diabetic status / severity: Fair controlled  Lab Results  Component Value Date   HGBA1C 6.7 (A) 10/11/2024    - Hemoglobin A1c goal : <6.5%  Discussed about avoiding and limiting snacks, especially at bedtime.  He started to cut down on the snacks at bedtime blood sugars improved in last week compared to previous week.  - Medications: No change.  I) continue acarbose  50 mg 3 times a day with meals. II) continue Janumet  extended release 50/500 mg daily. III) continue glimepiride /Amaryl  1 mg daily take with meal /supper.  - Home glucose testing: Dexcom G7 and check as needed. - Discussed/ Gave Hypoglycemia treatment plan.  # Consult : Refer to dietitian/diabetes educator.  Patient is interested.  # Annual urine for microalbuminuria/ creatinine ratio, no microalbuminuria currently.  Lab Results  Component Value Date    MICRALBCREAT 2 05/24/2024    # Foot check nightly.  # Annual dilated diabetic eye exams.  Patient has ?  Mild diabetic retinopathy.  He reports he had recent diabetic eye exam, asked to fax report to our clinic.  - Diet: Make healthy diabetic food choices, discussed about limiting rice and may try pear boiled rice or the brown rice. - Life style / activity / exercise: Discussed.  Advised to increase physical activity or walking.  2. Blood pressure  -  BP Readings from Last 1 Encounters:  10/11/24 116/70    - Control is in target.  - No change in current plans.  3. Lipid status / Hyperlipidemia - Last  Lab Results  Component Value Date   LDLCALC 53 05/24/2024   - Continue continue atorvastatin  10 mg daily.  Diagnoses and all orders for this visit:  Type 2 diabetes mellitus with other specified complication, without long-term current use of insulin (HCC) -     POCT glycosylated hemoglobin (Hb A1C) -     SitaGLIPtin -MetFORMIN  HCl (JANUMET  XR) 50-500 MG TB24; Take 1 tablet by mouth daily. -     Continuous Glucose Sensor (DEXCOM G7 SENSOR) MISC; 1 Device by Does not apply route continuous. Change every 10 days.  Controlled type 2 diabetes mellitus with complication, without long-term current use of insulin (HCC) -     glimepiride  (AMARYL ) 1 MG tablet; TAKE 1 TABLET BY MOUTH EVERYDAY AT BEDTIME -     acarbose  (PRECOSE ) 50 MG tablet; Take 2 tablet by mouth daily at lunch.  May also take 1 tab at breakfast and dinner if eating  Hypercholesterolemia -     atorvastatin  (LIPITOR) 10 MG tablet; Take 1 tablet (10 mg total) by mouth daily.   DISPOSITION Follow up in clinic in 4 months suggested.  Labs on the same day of the visit.   All questions answered and patient verbalized understanding of the plan.  Joe Malakye Nolden, MD [facility] Endocrinology [facility] [address] [place], [address] [place], [place] [address] Phone # [phone]  At least part of this note was  generated using voice recognition software. Inadvertent word errors may have occurred, which were not recognized during the proofreading process.

## 2024-10-16 ENCOUNTER — Encounter: Payer: Self-pay | Admitting: Physician Assistant

## 2024-10-16 ENCOUNTER — Ambulatory Visit (INDEPENDENT_AMBULATORY_CARE_PROVIDER_SITE_OTHER): Admitting: Physician Assistant

## 2024-10-16 VITALS — BP 130/80 | HR 84 | Temp 98.1°F | Ht 68.0 in | Wt 153.4 lb

## 2024-10-16 DIAGNOSIS — R051 Acute cough: Secondary | ICD-10-CM | POA: Diagnosis not present

## 2024-10-16 LAB — POC COVID19 BINAXNOW: SARS Coronavirus 2 Ag: NEGATIVE

## 2024-10-16 LAB — POCT INFLUENZA A/B
Influenza A, POC: NEGATIVE
Influenza B, POC: NEGATIVE

## 2024-10-16 MED ORDER — DOXYCYCLINE HYCLATE 100 MG PO TABS
100.0000 mg | ORAL_TABLET | Freq: Two times a day (BID) | ORAL | 0 refills | Status: AC
Start: 2024-10-16 — End: ?

## 2024-10-16 NOTE — Progress Notes (Signed)
 Joe Boyd is a 48 y.o. male here for a new problem.  History of Present Illness:   Chief Complaint  Patient presents with   Cough    Pt c/o cough and expectorating brown sputum, started on Thursday last week, headache, body aches, chills.    Discussed the use of AI scribe software for clinical note transcription with the patient, who gave verbal consent to proceed.  History of Present Illness   Joe Boyd is a 48 year old male who presents with symptoms of an upper respiratory infection.  Symptoms began on Thursday and worsened by Friday night. He experiences increased coughing, body chills, headache, and a sore throat that makes swallowing painful. He feels feverish but has not measured his temperature. He frequently blows his nose with increased mucus production and coughs up mucus.  He uses DayQuil, NightQuil, and Tylenol  for symptom management and drinks hot water with cough drops to alleviate his cough.  He has diabetes and monitors his blood sugars, which remain stable. His blood sugar was 162 mg/dL at the time of the visit, having last eaten breakfast at 8:45 AM.       Past Medical History:  Diagnosis Date   Depression 04/27/24   Miod goes up and down   Diabetes mellitus without complication (HCC)    Tuberculosis    Finished Rx in 8/15     Social History   Tobacco Use   Smoking status: Never   Smokeless tobacco: Never  Vaping Use   Vaping status: Never Used  Substance Use Topics   Alcohol use: Yes    Alcohol/week: 2.0 standard drinks of alcohol    Types: 1 Glasses of wine, 1 Standard drinks or equivalent per week    Comment: Drink a wine or cocktail once or twice a month socially   Drug use: Never    No past surgical history on file.  Family History  Problem Relation Age of Onset   Diabetes Mother    Hypertension Mother    Thyroid  disease Mother    Heart disease Mother    Diabetes Father    Hypertension Father    Heart disease Father     Breast cancer Sister        40s   Heart disease Paternal Uncle    Cancer Neg Hx    Colon cancer Neg Hx     Allergies  Allergen Reactions   Augmentin  [Amoxicillin -Pot Clavulanate] Itching   Diclofenac      Abnormal liver functions    Current Medications:   Current Outpatient Medications:    acarbose  (PRECOSE ) 50 MG tablet, Take 2 tablet by mouth daily at lunch.  May also take 1 tab at breakfast and dinner if eating, Disp: 270 tablet, Rfl: 3   atorvastatin  (LIPITOR) 10 MG tablet, Take 1 tablet (10 mg total) by mouth daily., Disp: 90 tablet, Rfl: 3   Blood Glucose Monitoring Suppl (GHT BLOOD GLUCOSE MONITOR) w/Device KIT, 1 EACH BY DOES NOT APPLY ROUTE IN THE MORNING, AT NOON, AND AT BEDTIME. MAY SUBSTITUTE TO ANY MANUFACTURER COVERED BY PATIENT'S INSURANCE., Disp: 1 kit, Rfl: 0   Blood Glucose Monitoring Suppl DEVI, 1 each by Does not apply route in the morning, at noon, and at bedtime. May substitute to any manufacturer covered by patient's insurance., Disp: 1 each, Rfl: 0   Continuous Glucose Sensor (DEXCOM G7 SENSOR) MISC, 1 Device by Does not apply route continuous. Change every 10 days., Disp: 9 each, Rfl: 3   glimepiride  (AMARYL )  1 MG tablet, TAKE 1 TABLET BY MOUTH EVERYDAY AT BEDTIME, Disp: 90 tablet, Rfl: 3   Glucose Blood (BLOOD GLUCOSE TEST STRIPS) STRP, 1 each by In Vitro route in the morning and at bedtime. May substitute to any manufacturer covered by patient's insurance., Disp: 60 each, Rfl: 11   Glucose Blood (BLOOD GLUCOSE TEST STRIPS) STRP, 1 each by In Vitro route daily. May substitute to any manufacturer covered by patient's insurance., Disp: 100 each, Rfl: 3   Lancets Misc. MISC, 1 each by Does not apply route daily. May substitute to any manufacturer covered by patient's insurance., Disp: 100 each, Rfl: 3   OneTouch Delica Lancets 30G MISC, Use to check blood sugars daily-Dx code E11.65, Disp: 100 each, Rfl: 1   SitaGLIPtin -MetFORMIN  HCl (JANUMET  XR) 50-500 MG TB24, Take  1 tablet by mouth daily., Disp: 90 tablet, Rfl: 3   Review of Systems:   Negative unless otherwise specified per HPI.  Vitals:   Vitals:   10/16/24 1500  BP: 130/80  Pulse: 84  Temp: 98.1 F (36.7 C)  TempSrc: Temporal  SpO2: 98%  Weight: 153 lb 6.1 oz (69.6 kg)  Height: 5' 8 (1.727 m)     Body mass index is 23.32 kg/m.  Physical Exam:   Physical Exam Vitals and nursing note reviewed.  Constitutional:      General: He is not in acute distress.    Appearance: He is well-developed. He is not ill-appearing or toxic-appearing.  HENT:     Head: Normocephalic and atraumatic.     Right Ear: Tympanic membrane, ear canal and external ear normal. Tympanic membrane is not erythematous, retracted or bulging.     Left Ear: Tympanic membrane, ear canal and external ear normal. Tympanic membrane is not erythematous, retracted or bulging.     Nose: Nose normal.     Right Sinus: No maxillary sinus tenderness or frontal sinus tenderness.     Left Sinus: No maxillary sinus tenderness or frontal sinus tenderness.     Mouth/Throat:     Pharynx: Uvula midline. No posterior oropharyngeal erythema.  Eyes:     General: Lids are normal.     Conjunctiva/sclera: Conjunctivae normal.  Neck:     Trachea: Trachea normal.  Cardiovascular:     Rate and Rhythm: Normal rate and regular rhythm.     Pulses: Normal pulses.     Heart sounds: Normal heart sounds, S1 normal and S2 normal.  Pulmonary:     Effort: Pulmonary effort is normal.     Breath sounds: Normal breath sounds. No decreased breath sounds, wheezing, rhonchi or rales.  Lymphadenopathy:     Cervical: No cervical adenopathy.  Skin:    General: Skin is warm and dry.  Neurological:     Mental Status: He is alert.     GCS: GCS eye subscore is 4. GCS verbal subscore is 5. GCS motor subscore is 6.  Psychiatric:        Speech: Speech normal.        Behavior: Behavior normal. Behavior is cooperative.    Flu and COVID test  negative   Assessment and Plan:   Assessment and Plan    Acute cough Symptoms suggest sinusitis. Augmentin  allergy noted. - Prescribed doxycycline , sent prescription to [facility] in [place]. - Ordered COVID-19 and influenza tests. These were negative. - Discussed taking medications as prescribed.  - Reviewed return precautions including new or worsening fever, SOB, new or worsening cough or other concerns.  - Push fluids and  rest.  - I recommend that patient follow-up if symptoms worsen or persist despite treatment x 7-10 days, sooner if needed.   Lucie Buttner, [name]

## 2024-10-17 ENCOUNTER — Other Ambulatory Visit: Payer: Self-pay | Admitting: Physician Assistant

## 2024-10-17 ENCOUNTER — Encounter: Payer: Self-pay | Admitting: Physician Assistant

## 2024-10-17 MED ORDER — POLYMYXIN B-TRIMETHOPRIM 10000-0.1 UNIT/ML-% OP SOLN: 1.0000 [drp] | Freq: Four times a day (QID) | OPHTHALMIC | 0 refills | Status: AC

## 2024-11-01 ENCOUNTER — Telehealth: Admitting: Physician Assistant

## 2024-11-01 ENCOUNTER — Ambulatory Visit

## 2024-11-01 ENCOUNTER — Encounter: Payer: Self-pay | Admitting: Physician Assistant

## 2024-11-01 DIAGNOSIS — J208 Acute bronchitis due to other specified organisms: Secondary | ICD-10-CM

## 2024-11-01 DIAGNOSIS — B9689 Other specified bacterial agents as the cause of diseases classified elsewhere: Secondary | ICD-10-CM

## 2024-11-01 MED ORDER — PROMETHAZINE-DM 6.25-15 MG/5ML PO SYRP: 5.0000 mL | ORAL_SOLUTION | Freq: Four times a day (QID) | ORAL | 0 refills | Status: AC | PRN

## 2024-11-01 MED ORDER — AZITHROMYCIN 250 MG PO TABS: ORAL_TABLET | ORAL | 0 refills | Status: AC

## 2024-11-01 MED ORDER — ALBUTEROL SULFATE HFA 108 (90 BASE) MCG/ACT IN AERS: 1.0000 | INHALATION_SPRAY | Freq: Four times a day (QID) | RESPIRATORY_TRACT | 0 refills | Status: AC | PRN

## 2024-11-01 MED ORDER — PREDNISONE 10 MG (21) PO TBPK: ORAL_TABLET | ORAL | 0 refills | Status: AC

## 2024-11-01 NOTE — Patient Instructions (Signed)
 Lavada Cahill, thank you for joining [name], [name] for today's virtual visit.  While this provider is not your primary care provider (PCP), if your PCP is located in our provider database this encounter information will be shared with them immediately following your visit.   A [facility] MyChart account gives you access to today's visit and all your visits, tests, and labs performed at [facility]  click here if you don't have a [facility] MyChart account or go to [email or web address]  Consent: (Patient) Joe Boyd provided verbal consent for this virtual visit at the beginning of the encounter.  Current Medications:  Current Outpatient Medications:    albuterol  (VENTOLIN  HFA) 108 (90 Base) MCG/ACT inhaler, Inhale 1-2 puffs into the lungs every 6 (six) hours as needed., Disp: 8 g, Rfl: 0   azithromycin  (ZITHROMAX ) 250 MG tablet, Take 2 tablets on day 1, then 1 tablet daily on days 2 through 5, Disp: 6 tablet, Rfl: 0   predniSONE  (STERAPRED UNI-PAK 21 TAB) 10 MG (21) TBPK tablet, 6 day taper; take as directed on package instructions, Disp: 21 tablet, Rfl: 0   promethazine-dextromethorphan (PROMETHAZINE-DM) 6.25-15 MG/5ML syrup, Take 5 mLs by mouth 4 (four) times daily as needed., Disp: 118 mL, Rfl: 0   acarbose  (PRECOSE ) 50 MG tablet, Take 2 tablet by mouth daily at lunch.  May also take 1 tab at breakfast and dinner if eating, Disp: 270 tablet, Rfl: 3   atorvastatin  (LIPITOR) 10 MG tablet, Take 1 tablet (10 mg total) by mouth daily., Disp: 90 tablet, Rfl: 3   Blood Glucose Monitoring Suppl (GHT BLOOD GLUCOSE MONITOR) w/Device KIT, 1 EACH BY DOES NOT APPLY ROUTE IN THE MORNING, AT NOON, AND AT BEDTIME. MAY SUBSTITUTE TO ANY MANUFACTURER COVERED BY PATIENT'S INSURANCE., Disp: 1 kit, Rfl: 0   Blood Glucose Monitoring Suppl DEVI, 1 each by Does not apply route in the morning, at noon, and at bedtime. May substitute to any manufacturer covered by patient's  insurance., Disp: 1 each, Rfl: 0   Continuous Glucose Sensor (DEXCOM G7 SENSOR) MISC, 1 Device by Does not apply route continuous. Change every 10 days., Disp: 9 each, Rfl: 3   doxycycline  (VIBRA -TABS) 100 MG tablet, Take 1 tablet (100 mg total) by mouth 2 (two) times daily., Disp: 20 tablet, Rfl: 0   glimepiride  (AMARYL ) 1 MG tablet, TAKE 1 TABLET BY MOUTH EVERYDAY AT BEDTIME, Disp: 90 tablet, Rfl: 3   Glucose Blood (BLOOD GLUCOSE TEST STRIPS) STRP, 1 each by In Vitro route in the morning and at bedtime. May substitute to any manufacturer covered by patient's insurance., Disp: 60 each, Rfl: 11   Glucose Blood (BLOOD GLUCOSE TEST STRIPS) STRP, 1 each by In Vitro route daily. May substitute to any manufacturer covered by patient's insurance., Disp: 100 each, Rfl: 3   Lancets Misc. MISC, 1 each by Does not apply route daily. May substitute to any manufacturer covered by patient's insurance., Disp: 100 each, Rfl: 3   OneTouch Delica Lancets 30G MISC, Use to check blood sugars daily-Dx code E11.65, Disp: 100 each, Rfl: 1   SitaGLIPtin -MetFORMIN  HCl (JANUMET  XR) 50-500 MG TB24, Take 1 tablet by mouth daily., Disp: 90 tablet, Rfl: 3   trimethoprim-polymyxin b (POLYTRIM) ophthalmic solution, Place 1 drop into the left eye every 6 (six) hours., Disp: 10 mL, Rfl: 0   Medications ordered in this encounter:  Meds ordered this encounter  Medications   azithromycin  (ZITHROMAX ) 250 MG tablet    Sig: Take 2 tablets on  day 1, then 1 tablet daily on days 2 through 5    Dispense:  6 tablet    Refill:  0    Supervising Provider:   BLAISE ALEENE KIDD [[id number]]   predniSONE  (STERAPRED UNI-PAK 21 TAB) 10 MG (21) TBPK tablet    Sig: 6 day taper; take as directed on package instructions    Dispense:  21 tablet    Refill:  0    Supervising Provider:   LAMPTEY, PHILIP O [[id number]]   albuterol  (VENTOLIN  HFA) 108 (90 Base) MCG/ACT inhaler    Sig: Inhale 1-2 puffs into the lungs every 6 (six) hours as needed.     Dispense:  8 g    Refill:  0    Supervising Provider:   BLAISE ALEENE KIDD [[id number]]   promethazine-dextromethorphan (PROMETHAZINE-DM) 6.25-15 MG/5ML syrup    Sig: Take 5 mLs by mouth 4 (four) times daily as needed.    Dispense:  118 mL    Refill:  0    Supervising Provider:   BLAISE ALEENE KIDD [[id number]]     *If you need refills on other medications prior to your next appointment, please contact your pharmacy*  Follow-Up: Call back or seek an in-person evaluation if the symptoms worsen or if the condition fails to improve as anticipated.  [facility] [phone]  Other Instructions  Acute Bronchitis, Adult  Acute bronchitis is sudden inflammation of the main airways (bronchi) that come off the windpipe (trachea) in the lungs. The swelling causes the airways to get smaller and make more mucus than normal. This can make it hard to breathe and can cause coughing or noisy breathing (wheezing). Acute bronchitis may last several weeks. The cough may last longer. Allergies, asthma, and exposure to smoke may make the condition worse. What are the causes? This condition can be caused by germs and by substances that irritate the lungs, including: Cold and flu viruses. The most common cause of this condition is the virus that causes the common cold. Bacteria. This is less common. Breathing in substances that irritate the lungs, including: Smoke from cigarettes and other forms of tobacco. Dust and pollen. Fumes from household cleaning products, gases, or burned fuel. Indoor or outdoor air pollution. What increases the risk? The following factors may make you more likely to develop this condition: A weak body's defense system, also called the immune system. A condition that affects your lungs and breathing, such as asthma. What are the signs or symptoms? Common symptoms of this condition include: Coughing. This may bring up clear, yellow, or green mucus from your lungs  (sputum). Wheezing. Runny or stuffy nose. Having too much mucus in your lungs (chest congestion). Shortness of breath. Aches and pains, including sore throat or chest. How is this diagnosed? This condition is usually diagnosed based on: Your symptoms and medical history. A physical exam. You may also have other tests, including tests to rule out other conditions, such as pneumonia. These tests include: A test of lung function. Test of a mucus sample to look for the presence of bacteria. Tests to check the oxygen level in your blood. Blood tests. Chest X-ray. How is this treated? Most cases of acute bronchitis clear up over time without treatment. Your health care provider may recommend: Drinking more fluids to help thin your mucus so it is easier to cough up. Taking inhaled medicine (inhaler) to improve air flow in and out of your lungs. Using a vaporizer or a humidifier. These are machines  that add water to the air to help you breathe better. Taking a medicine that thins mucus and clears congestion (expectorant). Taking a medicine that prevents or stops coughing (cough suppressant). It is not common to take an antibiotic medicine for this condition. Follow these instructions at home:  Take over-the-counter and prescription medicines only as told by your health care provider. Use an inhaler, vaporizer, or humidifier as told by your health care provider. Take two teaspoons (10 mL) of honey at bedtime to lessen coughing at night. Drink enough fluid to keep your urine pale yellow. Do not use any products that contain nicotine or tobacco. These products include cigarettes, chewing tobacco, and vaping devices, such as e-cigarettes. If you need help quitting, ask your health care provider. Get plenty of rest. Return to your normal activities as told by your health care provider. Ask your health care provider what activities are safe for you. Keep all follow-up visits. This is  important. How is this prevented? To lower your risk of getting this condition again: Wash your hands often with soap and water for at least 20 seconds. If soap and water are not available, use hand sanitizer. Avoid contact with people who have cold symptoms. Try not to touch your mouth, nose, or eyes with your hands. Avoid breathing in smoke or chemical fumes. Breathing smoke or chemical fumes will make your condition worse. Get the flu shot every year. Contact a health care provider if: Your symptoms do not improve after 2 weeks. You have trouble coughing up the mucus. Your cough keeps you awake at night. You have a fever. Get help right away if you: Cough up blood. Feel pain in your chest. Have severe shortness of breath. Faint or keep feeling like you are going to faint. Have a severe headache. Have a fever or chills that get worse. These symptoms may represent a serious problem that is an emergency. Do not wait to see if the symptoms will go away. Get medical help right away. Call your local emergency services (911 in the [place] Do not drive yourself to the hospital. Summary Acute bronchitis is inflammation of the main airways (bronchi) that come off the windpipe (trachea) in the lungs. The swelling causes the airways to get smaller and make more mucus than normal. Drinking more fluids can help thin your mucus so it is easier to cough up. Take over-the-counter and prescription medicines only as told by your health care provider. Do not use any products that contain nicotine or tobacco. These products include cigarettes, chewing tobacco, and vaping devices, such as e-cigarettes. If you need help quitting, ask your health care provider. Contact a health care provider if your symptoms do not improve after 2 weeks. This information is not intended to replace advice given to you by your health care provider. Make sure you discuss any questions you have with your health care  provider. Document Revised: 03/26/2022 Document Reviewed: 04/16/2021 Elsevier Patient Education  2024 [employer]   If you have been instructed to have an in-person evaluation today at a local Urgent Care facility, please use the link below. It will take you to a list of all of our available [facility], including address, phone number and hours of operation. Please do not delay care.  [facility]  If you or a family member do not have a primary care provider, use the link below to schedule a visit and establish care. When you choose a [facility] primary  care physician or advanced practice provider, you gain a long-term partner in health. Find a Primary Care Provider  Learn more about [facility]'s in-office and virtual care options: [facility] Get Care Now

## 2024-11-01 NOTE — Progress Notes (Signed)
 Virtual Visit Consent   Joe Boyd, you are scheduled for a virtual visit with a [place] provider today. Just as with appointments in the office, your consent must be obtained to participate. Your consent will be active for this visit and any virtual visit you may have with one of our providers in the next 365 days. If you have a MyChart account, a copy of this consent can be sent to you electronically.  As this is a virtual visit, video technology does not allow for your provider to perform a traditional examination. This may limit your provider's ability to fully assess your condition. If your provider identifies any concerns that need to be evaluated in person or the need to arrange testing (such as labs, EKG, etc.), we will make arrangements to do so. Although advances in technology are sophisticated, we cannot ensure that it will always work on either your end or our end. If the connection with a video visit is poor, the visit may have to be switched to a telephone visit. With either a video or telephone visit, we are not always able to ensure that we have a secure connection.  By engaging in this virtual visit, you consent to the provision of healthcare and authorize for your insurance to be billed (if applicable) for the services provided during this visit. Depending on your insurance coverage, you may receive a charge related to this service.  I need to obtain your verbal consent now. Are you willing to proceed with your visit today? Joe Boyd has provided verbal consent on 11/01/2024 for a virtual visit (video or telephone). Joe CHRISTELLA Dickinson, [name]  Date: 11/01/2024 12:30 PM   Virtual Visit via Video Note   IDelon CHRISTELLA Boyd, connected with  Joe Boyd  ([id number], February 05, 1976) on 11/01/24 at 12:00 PM EST by a video-enabled telemedicine application and verified that I am speaking with the correct person using two identifiers.  Location: Patient: Virtual Visit  Location Patient: Home Provider: Virtual Visit Location Provider: Home Office   I discussed the limitations of evaluation and management by telemedicine and the availability of in person appointments. The patient expressed understanding and agreed to proceed.    History of Present Illness: Joe Boyd is a 48 y.o. who identifies as a male who was assigned male at birth, and is being seen today for cough and congestion.  HPI: Cough This is a new problem. The current episode started 1 to 4 weeks ago (2 weeks, completed Doxycycline  100mg  BID x 10 days, seen by PCP on 10/16/24; symptoms improved but cough is persistent). The problem has been gradually improving. The problem occurs every few minutes. The cough is Non-productive (vomit-inducing twice). Associated symptoms include headaches, myalgias, nasal congestion, postnasal drip, rhinorrhea, a sore throat (sensitive on hard palate) and wheezing. Pertinent negatives include no chills, ear congestion, ear pain, fever, heartburn or shortness of breath. Associated symptoms comments: Chest congestion, cannot take deep breath due to coughing. The symptoms are aggravated by lying down. Treatments tried: Doxycycline , Tylenol , cough drops, dayquil, and Nyquil. The treatment provided no relief. His past medical history is significant for pneumonia (remote history 2010-2011). There is no history of asthma or bronchitis.     Problems:  Patient Active Problem List   Diagnosis Date Noted   Nuclear sclerotic cataract of both eyes 10/15/2020   Glaucoma suspect of both eyes 10/15/2020   Thumb pain, left 11/16/2018   Lumbar pain with radiation down right leg 11/16/2018   Type 2  diabetes mellitus with hyperglycemia (HCC) 02/23/2015    Allergies:  Allergies  Allergen Reactions   Augmentin  [Amoxicillin -Pot Clavulanate] Itching   Diclofenac      Abnormal liver functions   Medications:  Current Outpatient Medications:    albuterol  (VENTOLIN  HFA) 108 (90  Base) MCG/ACT inhaler, Inhale 1-2 puffs into the lungs every 6 (six) hours as needed., Disp: 8 g, Rfl: 0   azithromycin  (ZITHROMAX ) 250 MG tablet, Take 2 tablets on day 1, then 1 tablet daily on days 2 through 5, Disp: 6 tablet, Rfl: 0   predniSONE  (STERAPRED UNI-PAK 21 TAB) 10 MG (21) TBPK tablet, 6 day taper; take as directed on package instructions, Disp: 21 tablet, Rfl: 0   promethazine-dextromethorphan (PROMETHAZINE-DM) 6.25-15 MG/5ML syrup, Take 5 mLs by mouth 4 (four) times daily as needed., Disp: 118 mL, Rfl: 0   acarbose  (PRECOSE ) 50 MG tablet, Take 2 tablet by mouth daily at lunch.  May also take 1 tab at breakfast and dinner if eating, Disp: 270 tablet, Rfl: 3   atorvastatin  (LIPITOR) 10 MG tablet, Take 1 tablet (10 mg total) by mouth daily., Disp: 90 tablet, Rfl: 3   Blood Glucose Monitoring Suppl (GHT BLOOD GLUCOSE MONITOR) w/Device KIT, 1 EACH BY DOES NOT APPLY ROUTE IN THE MORNING, AT NOON, AND AT BEDTIME. MAY SUBSTITUTE TO ANY MANUFACTURER COVERED BY PATIENT'S INSURANCE., Disp: 1 kit, Rfl: 0   Blood Glucose Monitoring Suppl DEVI, 1 each by Does not apply route in the morning, at noon, and at bedtime. May substitute to any manufacturer covered by patient's insurance., Disp: 1 each, Rfl: 0   Continuous Glucose Sensor (DEXCOM G7 SENSOR) MISC, 1 Device by Does not apply route continuous. Change every 10 days., Disp: 9 each, Rfl: 3   doxycycline  (VIBRA -TABS) 100 MG tablet, Take 1 tablet (100 mg total) by mouth 2 (two) times daily., Disp: 20 tablet, Rfl: 0   glimepiride  (AMARYL ) 1 MG tablet, TAKE 1 TABLET BY MOUTH EVERYDAY AT BEDTIME, Disp: 90 tablet, Rfl: 3   Glucose Blood (BLOOD GLUCOSE TEST STRIPS) STRP, 1 each by In Vitro route in the morning and at bedtime. May substitute to any manufacturer covered by patient's insurance., Disp: 60 each, Rfl: 11   Glucose Blood (BLOOD GLUCOSE TEST STRIPS) STRP, 1 each by In Vitro route daily. May substitute to any manufacturer covered by patient's  insurance., Disp: 100 each, Rfl: 3   Lancets Misc. MISC, 1 each by Does not apply route daily. May substitute to any manufacturer covered by patient's insurance., Disp: 100 each, Rfl: 3   OneTouch Delica Lancets 30G MISC, Use to check blood sugars daily-Dx code E11.65, Disp: 100 each, Rfl: 1   SitaGLIPtin -MetFORMIN  HCl (JANUMET  XR) 50-500 MG TB24, Take 1 tablet by mouth daily., Disp: 90 tablet, Rfl: 3   trimethoprim-polymyxin b (POLYTRIM) ophthalmic solution, Place 1 drop into the left eye every 6 (six) hours., Disp: 10 mL, Rfl: 0  Observations/Objective: Patient is well-developed, well-nourished in no acute distress.  Resting comfortably at home.  Head is normocephalic, atraumatic.  No labored breathing.  Speech is clear and coherent with logical content.  Patient is alert and oriented at baseline.  Frequent, dry, hacking cough heard  Assessment and Plan: 1. Acute bacterial bronchitis (Primary) - azithromycin  (ZITHROMAX ) 250 MG tablet; Take 2 tablets on day 1, then 1 tablet daily on days 2 through 5  Dispense: 6 tablet; Refill: 0 - predniSONE  (STERAPRED UNI-PAK 21 TAB) 10 MG (21) TBPK tablet; 6 day taper; take as directed on  package instructions  Dispense: 21 tablet; Refill: 0 - albuterol  (VENTOLIN  HFA) 108 (90 Base) MCG/ACT inhaler; Inhale 1-2 puffs into the lungs every 6 (six) hours as needed.  Dispense: 8 g; Refill: 0 - promethazine-dextromethorphan (PROMETHAZINE-DM) 6.25-15 MG/5ML syrup; Take 5 mLs by mouth 4 (four) times daily as needed.  Dispense: 118 mL; Refill: 0  - Worsening over a week despite OTC medications - Will treat with Z-pack and Prednisone  Taper - Add Albuterol  - Promethazine DM for nighttime cough - Can add Mucinex  during the day - Push fluids.  - Rest.  - Steam and humidifier can help - Seek in person evaluation if worsening or symptoms fail to improve    Follow Up Instructions: I discussed the assessment and treatment plan with the patient. The patient was  provided an opportunity to ask questions and all were answered. The patient agreed with the plan and demonstrated an understanding of the instructions.  A copy of instructions were sent to the patient via MyChart unless otherwise noted below.    The patient was advised to call back or seek an in-person evaluation if the symptoms worsen or if the condition fails to improve as anticipated.    Joe CHRISTELLA Dickinson, [name]

## 2024-11-09 ENCOUNTER — Encounter: Payer: Self-pay | Admitting: Physician Assistant

## 2024-11-09 DIAGNOSIS — R053 Chronic cough: Secondary | ICD-10-CM

## 2024-11-10 NOTE — Telephone Encounter (Signed)
Okay for Pulmonary referral? 

## 2024-11-29 ENCOUNTER — Encounter: Payer: Self-pay | Admitting: Pulmonary Disease

## 2024-11-29 ENCOUNTER — Ambulatory Visit: Admitting: Pulmonary Disease

## 2024-11-29 VITALS — BP 138/81 | HR 68 | Temp 97.8°F | Ht 68.0 in | Wt 152.0 lb

## 2024-11-29 DIAGNOSIS — Z8709 Personal history of other diseases of the respiratory system: Secondary | ICD-10-CM

## 2024-11-29 DIAGNOSIS — R058 Other specified cough: Secondary | ICD-10-CM | POA: Diagnosis not present

## 2024-11-29 DIAGNOSIS — R0982 Postnasal drip: Secondary | ICD-10-CM | POA: Diagnosis not present

## 2024-11-29 DIAGNOSIS — R059 Cough, unspecified: Secondary | ICD-10-CM

## 2024-11-29 LAB — POCT EXHALED NITRIC OXIDE: FeNO level (ppb): 10 (ref ?–50)

## 2024-11-29 NOTE — Patient Instructions (Signed)
  VISIT SUMMARY: You visited us  today due to a persistent cough that has lasted for about 6 weeks. We discussed your symptoms, possible causes, and a treatment plan to help alleviate your cough.  YOUR PLAN: PERSISTENT COUGH: Your persistent cough is likely due to postnasal drip following a viral upper respiratory infection. Other possible causes like acid reflux and lung issues are less likely based on your history and test results. -Use a steroid nasal spray (Flonase  or Nasonex) two puffs twice a day for 2-4 weeks. -Take over-the-counter chlorpheniramine, 1-2 tablets three times a day. Be cautious as it may cause drowsiness. -Monitor your symptoms for 2-4 weeks and come back for reassessment if the cough persists. -If your symptoms do not improve, we may consider a chest X-ray and lung function test.

## 2024-11-29 NOTE — Progress Notes (Signed)
97/65  

## 2024-11-29 NOTE — Progress Notes (Signed)
 Joe Boyd    [id number]    12-05-1976  Primary Care Physician:Boyd, Joe, [place]  Referring Physician: Job Lucie, PA [address] [place],  [place] [address]  Chief complaint: Consult for cough  HPI: 48 y.o. who  has a past medical history of Depression (04/27/24), Diabetes mellitus without complication (HCC), and Tuberculosis.  Discussed the use of AI scribe software for clinical note transcription with the patient, who gave verbal consent to proceed.  History of Present Illness Joe Boyd is a 48 year old male with diabetes who presents with a persistent cough.   Cough - Persistent cough for approximately 6 weeks, onset around October 16, 2024 - Partial improvement but not full resolution with albuterol , azithromycin , prednisone , and promethazine  cough syrup - Cough is intermittently dry or productive - Sensation of something in the lung or throat - Typically experiences a bad cough with infections, but not of this prolonged duration  Associated upper airway symptoms - Sensation of something stuck at the back of the throat, possibly consistent with postnasal drip - No known seasonal allergies - No chronic sinus issues  Relevant medical history - Diabetes mellitus - Prior acid reflux  Relevant Pulmonary history: Pets: Recently got a dog Occupation: Accountant Exposures: No mold, hot tub, Jacuzzi.  He has a feather pillow on the couch in the living room with minimal exposure No h/o chemo/XRT/amiodarone/macrodantin/MTX  No exposure to asbestos, silica or other organic allergens  Smoking history: No smoking or vaping Travel history: No significant recent travel Family history: No family history of lung disease  Outpatient Encounter Medications as of 11/29/2024  Medication Sig   acarbose  (PRECOSE ) 50 MG tablet Take 2 tablet by mouth daily at lunch.  May also take 1 tab at breakfast and dinner if eating   albuterol  (VENTOLIN  HFA) 108 (90  Base) MCG/ACT inhaler Inhale 1-2 puffs into the lungs every 6 (six) hours as needed.   atorvastatin  (LIPITOR) 10 MG tablet Take 1 tablet (10 mg total) by mouth daily.   Continuous Glucose Sensor (DEXCOM G7 SENSOR) MISC 1 Device by Does not apply route continuous. Change every 10 days.   glimepiride  (AMARYL ) 1 MG tablet TAKE 1 TABLET BY MOUTH EVERYDAY AT BEDTIME   Lancets Misc. MISC 1 each by Does not apply route daily. May substitute to any manufacturer covered by patient's insurance.   OneTouch Delica Lancets 30G MISC Use to check blood sugars daily-Dx code E11.65   promethazine -dextromethorphan (PROMETHAZINE -DM) 6.25-15 MG/5ML syrup Take 5 mLs by mouth 4 (four) times daily as needed.   SitaGLIPtin -MetFORMIN  HCl (JANUMET  XR) 50-500 MG TB24 Take 1 tablet by mouth daily.   Blood Glucose Monitoring Suppl (GHT BLOOD GLUCOSE MONITOR) w/Device KIT 1 EACH BY DOES NOT APPLY ROUTE IN THE MORNING, AT NOON, AND AT BEDTIME. MAY SUBSTITUTE TO ANY MANUFACTURER COVERED BY PATIENT'S INSURANCE.   Blood Glucose Monitoring Suppl DEVI 1 each by Does not apply route in the morning, at noon, and at bedtime. May substitute to any manufacturer covered by patient's insurance.   doxycycline  (VIBRA -TABS) 100 MG tablet Take 1 tablet (100 mg total) by mouth 2 (two) times daily. (Patient not taking: Reported on 11/29/2024)   Glucose Blood (BLOOD GLUCOSE TEST STRIPS) STRP 1 each by In Vitro route in the morning and at bedtime. May substitute to any manufacturer covered by patient's insurance.   Glucose Blood (BLOOD GLUCOSE TEST STRIPS) STRP 1 each by In Vitro route daily. May substitute to any manufacturer  covered by patient's insurance.   predniSONE  (STERAPRED UNI-PAK 21 TAB) 10 MG (21) TBPK tablet 6 day taper; take as directed on package instructions (Patient not taking: Reported on 11/29/2024)   trimethoprim -polymyxin b  (POLYTRIM ) ophthalmic solution Place 1 drop into the left eye every 6 (six) hours.   No facility-administered  encounter medications on file as of 11/29/2024.     Physical Exam:   Today's Vitals   11/29/24 1254  BP: 138/81  Pulse: 68  Temp: 97.8 F (36.6 C)  TempSrc: Oral  SpO2: 97%  Weight: 152 lb (68.9 kg)  Height: 5' 8 (1.727 m)   Body mass index is 23.11 kg/m.  Physical Exam GEN: No acute distress. CV: Regular rate and rhythm, no murmurs. LUNGS: Clear to auscultation bilaterally, no wheezing, normal respiratory effort. SKIN JOINTS: Warm and dry, no rash.   Data Reviewed: Imaging:  PFTs:  FENO 11/29/2024- 10  Labs:  Assessment & Plan Persistent cough likely due to postnasal drip following viral upper respiratory infection Persistent cough since mid-October, initially treated with antibiotics, steroids, and cough syrup. Symptoms improved but persist intermittently with dry and productive episodes. No significant lung issues on examination. FENO test is normal, indicating absence of eosinophilic inflammation, reducing suspicion for asthma or lung inflammation. Likely postnasal drip contributing to cough, possibly triggered by viral infection. Differential includes postnasal drip, acid reflux, and lung issues, but acid reflux and lung issues are less likely based on history and test results. - Recommended steroid nasal spray (Flonase  or Nasonex) two puffs twice a day for 2-4 weeks. - Recommended over-the-counter chlorpheniramine, 1-2 tablets three times a day, with caution for drowsiness. - Advised to monitor symptoms for 2-4 weeks and reassess if persistent. - Will consider chest X-ray and lung function test if symptoms persist.  Recommendations: Steroid nasal spray, over-the-counter chlorpheniramine Return to clinic if symptoms are not improved  Lonna Coder MD [facility] 11/29/2024, 1:04 PM  CC: Joe Lukes, PA

## 2024-12-08 ENCOUNTER — Encounter: Payer: Self-pay | Admitting: Endocrinology

## 2024-12-08 DIAGNOSIS — E1169 Type 2 diabetes mellitus with other specified complication: Secondary | ICD-10-CM

## 2024-12-08 MED ORDER — DEXCOM G7 15 DAY SENSOR MISC: 1.0000 | Freq: Once | 1 refills | Status: AC

## 2024-12-08 MED ORDER — DEXCOM G7 SENSOR MISC: 1.0000 | 1 refills | Status: DC

## 2024-12-08 NOTE — Addendum Note (Signed)
 Addended by: Draden Cottingham M on: 12/08/2024 10:57 AM   Modules accepted: Orders

## 2024-12-28 ENCOUNTER — Encounter: Payer: Self-pay | Admitting: Endocrinology

## 2024-12-29 ENCOUNTER — Other Ambulatory Visit: Payer: Self-pay

## 2024-12-29 DIAGNOSIS — E1169 Type 2 diabetes mellitus with other specified complication: Secondary | ICD-10-CM

## 2024-12-29 MED ORDER — JANUMET XR 50-500 MG PO TB24: 1.0000 | ORAL_TABLET | Freq: Every day | ORAL | 3 refills | Status: DC

## 2025-01-12 ENCOUNTER — Encounter: Payer: Self-pay | Admitting: Endocrinology

## 2025-01-17 ENCOUNTER — Telehealth: Payer: Self-pay

## 2025-01-17 NOTE — Telephone Encounter (Signed)
 PA needed for Dexcom g7 15 day. Sent to PA team. Patient called and made aware.

## 2025-01-22 ENCOUNTER — Telehealth: Payer: Self-pay

## 2025-01-22 ENCOUNTER — Other Ambulatory Visit (HOSPITAL_COMMUNITY): Payer: Self-pay

## 2025-01-22 NOTE — Telephone Encounter (Signed)
 Pharmacy Patient Advocate Encounter   Received notification from Pt Calls Messages that prior authorization for Dexcom G7 15 day sensor is required/requested.   Insurance verification completed.   The patient is insured through [employer].   Per test claim: PA required; PA submitted to above mentioned insurance via Latent Key/confirmation #/EOC [facility] Status is pending

## 2025-01-24 ENCOUNTER — Encounter: Payer: Self-pay | Admitting: Endocrinology

## 2025-01-25 ENCOUNTER — Other Ambulatory Visit (HOSPITAL_COMMUNITY): Payer: Self-pay

## 2025-01-25 NOTE — Telephone Encounter (Signed)
 Pharmacy Patient Advocate Encounter  Received notification from [facility] that Prior Authorization for Dexcom G7 15 day sensor  has been APPROVED from 01/24/25 to 01/24/26. Unable to obtain price due to refill too soon rejection, last fill date 01/24/25 next available fill date 02/17/25   PA #/Case ID/Reference #: [id number]

## 2025-01-29 ENCOUNTER — Telehealth: Payer: Self-pay

## 2025-01-29 ENCOUNTER — Other Ambulatory Visit (HOSPITAL_COMMUNITY): Payer: Self-pay

## 2025-01-29 DIAGNOSIS — E1169 Type 2 diabetes mellitus with other specified complication: Secondary | ICD-10-CM

## 2025-01-29 NOTE — Telephone Encounter (Signed)
 Pharmacy Patient Advocate Encounter   Received notification from Pt Calls Messages that prior authorization for Janumet  XR 50-500MG  er tablets is required/requested.   Insurance verification completed.   The patient is insured through [employer].   Per test claim: Per test claim, medication is not covered due to plan/benefit exclusion, PA not submitted at this time Per insurance: Must use Zituvimet  XR

## 2025-01-31 MED ORDER — SITAGLIPT BASE-METFORM HCL ER 50-500 MG PO TB24: 1.0000 | ORAL_TABLET | Freq: Every day | ORAL | 3 refills | Status: AC

## 2025-01-31 NOTE — Telephone Encounter (Signed)
 Sent prescription

## 2025-01-31 NOTE — Addendum Note (Signed)
 Addended by: Tamaj Jurgens on: 01/31/2025 10:36 AM   Modules accepted: Orders

## 2025-02-14 ENCOUNTER — Ambulatory Visit: Admitting: Endocrinology

## 5123-07-29 DEATH — deceased
